# Patient Record
Sex: Female | Born: 1975
Health system: Southern US, Community
[De-identification: ages and names within clinical notes are randomized; demographics above are authoritative.]

## PROBLEM LIST (undated history)

## (undated) DIAGNOSIS — Z1379 Encounter for other screening for genetic and chromosomal anomalies: Secondary | ICD-10-CM

## (undated) DIAGNOSIS — Z803 Family history of malignant neoplasm of breast: Secondary | ICD-10-CM

## (undated) DIAGNOSIS — F419 Anxiety disorder, unspecified: Secondary | ICD-10-CM

## (undated) DIAGNOSIS — D219 Benign neoplasm of connective and other soft tissue, unspecified: Secondary | ICD-10-CM

## (undated) DIAGNOSIS — Z1509 Genetic susceptibility to other malignant neoplasm: Secondary | ICD-10-CM

## (undated) DIAGNOSIS — T7840XA Allergy, unspecified, initial encounter: Secondary | ICD-10-CM

## (undated) DIAGNOSIS — N979 Female infertility, unspecified: Secondary | ICD-10-CM

## (undated) DIAGNOSIS — Z9189 Other specified personal risk factors, not elsewhere classified: Secondary | ICD-10-CM

## (undated) DIAGNOSIS — F410 Panic disorder [episodic paroxysmal anxiety] without agoraphobia: Secondary | ICD-10-CM

## (undated) HISTORY — DX: Family history of malignant neoplasm of breast: Z80.3

## (undated) HISTORY — DX: Other specified personal risk factors, not elsewhere classified: Z91.89

## (undated) HISTORY — DX: Panic disorder (episodic paroxysmal anxiety): F41.0

## (undated) HISTORY — DX: Genetic susceptibility to other malignant neoplasm: Z15.09

## (undated) HISTORY — DX: Benign neoplasm of connective and other soft tissue, unspecified: D21.9

## (undated) HISTORY — DX: Allergy, unspecified, initial encounter: T78.40XA

## (undated) HISTORY — DX: Anxiety disorder, unspecified: F41.9

## (undated) HISTORY — PX: WISDOM TOOTH EXTRACTION: SHX21

## (undated) HISTORY — DX: Female infertility, unspecified: N97.9

## (undated) HISTORY — DX: Encounter for other screening for genetic and chromosomal anomalies: Z13.79

---

## 2006-12-27 ENCOUNTER — Ambulatory Visit: Payer: Self-pay | Admitting: Family Medicine

## 2006-12-27 DIAGNOSIS — J309 Allergic rhinitis, unspecified: Secondary | ICD-10-CM

## 2006-12-27 DIAGNOSIS — J4540 Moderate persistent asthma, uncomplicated: Secondary | ICD-10-CM | POA: Insufficient documentation

## 2007-03-03 ENCOUNTER — Ambulatory Visit (HOSPITAL_COMMUNITY): Admission: RE | Admit: 2007-03-03 | Discharge: 2007-03-03 | Payer: Self-pay | Admitting: Obstetrics and Gynecology

## 2007-07-07 ENCOUNTER — Telehealth: Payer: Self-pay | Admitting: Family Medicine

## 2007-07-11 ENCOUNTER — Ambulatory Visit: Payer: Self-pay | Admitting: Family Medicine

## 2007-08-04 ENCOUNTER — Telehealth: Payer: Self-pay | Admitting: Family Medicine

## 2007-08-18 ENCOUNTER — Ambulatory Visit: Payer: Self-pay | Admitting: Family Medicine

## 2007-10-10 ENCOUNTER — Ambulatory Visit: Payer: Self-pay | Admitting: Family Medicine

## 2007-10-12 LAB — CONVERTED CEMR LAB: TSH: 0.18 microintl units/mL — ABNORMAL LOW (ref 0.35–5.50)

## 2007-12-05 ENCOUNTER — Ambulatory Visit: Payer: Self-pay | Admitting: Family Medicine

## 2007-12-05 DIAGNOSIS — K219 Gastro-esophageal reflux disease without esophagitis: Secondary | ICD-10-CM

## 2008-01-23 ENCOUNTER — Ambulatory Visit: Payer: Self-pay | Admitting: Family Medicine

## 2008-01-23 DIAGNOSIS — F410 Panic disorder [episodic paroxysmal anxiety] without agoraphobia: Secondary | ICD-10-CM

## 2008-02-09 ENCOUNTER — Telehealth: Payer: Self-pay | Admitting: Family Medicine

## 2008-02-09 ENCOUNTER — Ambulatory Visit: Payer: Self-pay | Admitting: Family Medicine

## 2008-08-12 ENCOUNTER — Ambulatory Visit: Payer: Self-pay | Admitting: Family Medicine

## 2008-12-23 ENCOUNTER — Ambulatory Visit: Payer: Self-pay | Admitting: Family Medicine

## 2008-12-23 DIAGNOSIS — H1045 Other chronic allergic conjunctivitis: Secondary | ICD-10-CM

## 2008-12-23 DIAGNOSIS — L2089 Other atopic dermatitis: Secondary | ICD-10-CM

## 2009-01-15 ENCOUNTER — Ambulatory Visit: Payer: Self-pay | Admitting: Family Medicine

## 2009-06-16 ENCOUNTER — Encounter: Payer: Self-pay | Admitting: Family Medicine

## 2009-06-19 ENCOUNTER — Ambulatory Visit: Payer: Self-pay | Admitting: Family Medicine

## 2009-06-27 ENCOUNTER — Telehealth: Payer: Self-pay | Admitting: Family Medicine

## 2009-07-02 ENCOUNTER — Telehealth: Payer: Self-pay | Admitting: Family Medicine

## 2009-07-04 ENCOUNTER — Telehealth: Payer: Self-pay | Admitting: Family Medicine

## 2009-07-04 ENCOUNTER — Ambulatory Visit: Payer: Self-pay | Admitting: Family Medicine

## 2009-08-05 ENCOUNTER — Telehealth: Payer: Self-pay | Admitting: Family Medicine

## 2009-08-06 ENCOUNTER — Telehealth: Payer: Self-pay | Admitting: Family Medicine

## 2009-08-07 ENCOUNTER — Telehealth: Payer: Self-pay | Admitting: Family Medicine

## 2009-08-07 ENCOUNTER — Encounter: Payer: Self-pay | Admitting: Family Medicine

## 2009-10-06 ENCOUNTER — Ambulatory Visit: Payer: Self-pay | Admitting: Internal Medicine

## 2009-10-10 ENCOUNTER — Ambulatory Visit: Payer: Self-pay | Admitting: Family Medicine

## 2009-11-13 ENCOUNTER — Ambulatory Visit: Payer: Self-pay | Admitting: Family Medicine

## 2009-11-13 DIAGNOSIS — R5383 Other fatigue: Secondary | ICD-10-CM

## 2009-11-13 DIAGNOSIS — R5381 Other malaise: Secondary | ICD-10-CM

## 2009-11-13 LAB — CONVERTED CEMR LAB: Vit D, 25-Hydroxy: 24 ng/mL — ABNORMAL LOW (ref 30–89)

## 2009-11-18 ENCOUNTER — Telehealth: Payer: Self-pay | Admitting: Family Medicine

## 2009-11-18 LAB — CONVERTED CEMR LAB
ALT: 11 units/L (ref 0–35)
Albumin: 4.1 g/dL (ref 3.5–5.2)
Basophils Relative: 0.7 % (ref 0.0–3.0)
Bilirubin, Direct: 0.1 mg/dL (ref 0.0–0.3)
CO2: 28 meq/L (ref 19–32)
Chloride: 105 meq/L (ref 96–112)
Eosinophils Relative: 3.7 % (ref 0.0–5.0)
HCT: 40 % (ref 36.0–46.0)
Lymphs Abs: 1.9 10*3/uL (ref 0.7–4.0)
MCHC: 33.3 g/dL (ref 30.0–36.0)
MCV: 90.5 fL (ref 78.0–100.0)
Monocytes Absolute: 0.3 10*3/uL (ref 0.1–1.0)
Neutro Abs: 6.7 10*3/uL (ref 1.4–7.7)
Potassium: 4 meq/L (ref 3.5–5.1)
RBC: 4.42 M/uL (ref 3.87–5.11)
TSH: 0.98 microintl units/mL (ref 0.35–5.50)
Total Protein: 6.8 g/dL (ref 6.0–8.3)
Vitamin B-12: 316 pg/mL (ref 211–911)
WBC: 9.3 10*3/uL (ref 4.5–10.5)

## 2009-11-19 ENCOUNTER — Ambulatory Visit: Payer: Self-pay | Admitting: Family Medicine

## 2009-11-19 LAB — CONVERTED CEMR LAB
Nitrite: NEGATIVE
RBC / HPF: 0
Urobilinogen, UA: 0.2
WBC Urine, dipstick: NEGATIVE

## 2009-11-20 ENCOUNTER — Telehealth: Payer: Self-pay | Admitting: Family Medicine

## 2009-11-20 LAB — CONVERTED CEMR LAB
Calcium: 9 mg/dL (ref 8.4–10.5)
Chloride: 104 meq/L (ref 96–112)
Creatinine, Ser: 0.9 mg/dL (ref 0.4–1.2)

## 2009-12-12 ENCOUNTER — Ambulatory Visit: Payer: Self-pay | Admitting: Family Medicine

## 2009-12-15 LAB — CONVERTED CEMR LAB
Lymphs Abs: 2.1 10*3/uL (ref 0.7–4.0)
MCV: 89.6 fL (ref 78.0–100.0)
Monocytes Relative: 7 % (ref 3–12)
Neutro Abs: 5.2 10*3/uL (ref 1.7–7.7)
Neutrophils Relative %: 62 % (ref 43–77)
RBC: 4.61 M/uL (ref 3.87–5.11)
WBC: 8.3 10*3/uL (ref 4.0–10.5)

## 2009-12-18 ENCOUNTER — Telehealth (INDEPENDENT_AMBULATORY_CARE_PROVIDER_SITE_OTHER): Payer: Self-pay | Admitting: *Deleted

## 2009-12-18 ENCOUNTER — Ambulatory Visit: Payer: Self-pay | Admitting: Family Medicine

## 2009-12-18 DIAGNOSIS — F3342 Major depressive disorder, recurrent, in full remission: Secondary | ICD-10-CM | POA: Insufficient documentation

## 2009-12-19 ENCOUNTER — Telehealth: Payer: Self-pay | Admitting: Family Medicine

## 2009-12-22 ENCOUNTER — Ambulatory Visit: Payer: Self-pay | Admitting: Family Medicine

## 2009-12-22 ENCOUNTER — Telehealth (INDEPENDENT_AMBULATORY_CARE_PROVIDER_SITE_OTHER): Payer: Self-pay | Admitting: *Deleted

## 2010-01-02 ENCOUNTER — Ambulatory Visit: Payer: Self-pay | Admitting: Family Medicine

## 2010-01-21 ENCOUNTER — Telehealth: Payer: Self-pay | Admitting: Family Medicine

## 2010-01-26 ENCOUNTER — Telehealth (INDEPENDENT_AMBULATORY_CARE_PROVIDER_SITE_OTHER): Payer: Self-pay | Admitting: *Deleted

## 2010-02-05 ENCOUNTER — Ambulatory Visit
Admission: RE | Admit: 2010-02-05 | Discharge: 2010-02-05 | Payer: Self-pay | Source: Home / Self Care | Attending: Family Medicine | Admitting: Family Medicine

## 2010-02-10 NOTE — Letter (Signed)
Summary: Endocrinology/[facility]  Endocrinology/[facility]   Imported By: Lester Ruth 06/28/2009 10:21:53  _____________________________________________________________________  External Attachment:    Type:   Image     Comment:   External Document

## 2010-02-10 NOTE — Assessment & Plan Note (Signed)
Summary: MUSCLE SPASMS/CLE   Vital Signs:  Patient profile:   35 year old female Height:      65 inches Weight:      141.0 pounds BMI:     23.55 Temp:     98.7 degrees F oral Pulse rate:   68 / minute Pulse rhythm:   regular BP sitting:   100 / 60  (left arm) Cuff size:   regular  Vitals Entered By: Benny Lennert CMA Duncan Dull) (November 13, 2009 4:16 PM)  History of Present Illness: Chief complaint muscle spasms  35 year old female:  Known well to me presents with a variety of complaints.  Bilateral hand tingling, intermittent buttocks pain, intermittent tingling in her feet. Worse with typing all day. Provocable at the elbow  Posterior neck pain, posterior trapezius pain. Occaisional headaches. Much muscle tightness Some pain in the intraarticular shoulder area as well B.   Likes job, working a lot, sitting in [employer], also working on the computer when getting home.  Trying to get pregnant. No success yet, has been a few years.  Reviewed her L shoulder injury in 09/2009, where a door fell on her, that got better in a couple of weeks afterwards.   ROS: no fevers, chills, sweats, n/v/d. No slurred speech. No balance disturbace. No numb areas. Tingling as described.    Allergies: 1)  ! * Advair 2)  * Singulair  Past History:  Past medical, surgical, family and social histories (including risk factors) reviewed, and no changes noted (except as noted below).  Past Medical History: Reviewed history from 12/23/2008 and no changes required. Allergic rhinitis and conjunctivitis Asthma Gerd Anxiety  Past Surgical History: Reviewed history from 12/27/2006 and no changes required. thyroid nodules, bx benign  Family History: Reviewed history from 12/27/2006 and no changes required. father healthy mother: anxiety brother: anxiety sister: healthy MGF: DM PGF: MI PGM: ? MGM: healthy  Social History: Reviewed history from 10/06/2009 and no changes  required. 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 MS:  See HPI. Neuro:  See HPI; Complains of tingling; denies brief paralysis, difficulty with concentration, disturbances in coordination, falling down, poor balance, seizures, sensation of room spinning, tremors, and visual disturbances. Psych:  denies acute depression right now, slightly anxious, but not decompensated.Marland Kitchen  Physical Exam  General:  Well-developed,well-nourished,in no acute distress; alert,appropriate and cooperative throughout examination Head:  Normocephalic and atraumatic without obvious abnormalities.  Ears:  no external deformities.   Nose:  no external deformity.   Neck:  TTP posteriorly along paracervical musculature. Lungs:  normal respiratory effort.   Msk:  Full ROM at neck no inducible spurlings c5-t1 intact sensation intact motor function 5/5 dtr 2+ UE and LE  full ROM shoulder, elbow + tinels at elbow  Wrist, full ROM, NT bony anatomy + phalen's test after 10 sec B  inducible parasthesias with compression at L brachial plexus, posterior to clavicle  Lumbar, NT throughout HIP EXAM: SIDE: B ROM: Abduction, Flexion, Internal and External range of motion: WNL Pain with terminal IROM and EROM: no GTB: NT SLR: NEG Knees: No effusion FABER: NT REVERSE FABER: NT, neg Piriformis: TTP at direct palpation - neuropathic sensations induced with deep palpation at the piriformis, L > R Str: flexion: 5/5 abduction: 5/5  adduction: 5/5 Strength testing non-tender    Impression & Recommendations:  Problem # 1:  PARESTHESIA (ICD-782.0) I don't think any of the patient's symptoms  right now relate to her prior LEFT shoulder injury last fall. Certainly the patient has a carpal tunnel  syndrome.  Think she's also having some mild ulnar neuropathy,, both of which are irritated by her constant  sitting at a computer at work and at home. She's also having I think some significant piriformis involvement aggravated by above as well.  I was able to manually provoke symptoms  with brachial plexus compression in the LEFT.  She seemed to have some heightened sensation of neuropathic symptoms,, but I don't whether there is any underlying unifying diagnosis.  She does have some anxiety, which probably does contribute some. Reviewed conservative management of all of these and will follow.  We'll check some baseline laboratories to ensure that they're normal  for potential causes of tingling.  Also for potential causes of tiredness and fatigue. Reassured the patient.  Orders: Venipuncture ([address]) T-Vitamin D (25-Hydroxy) [phone]) TLB-BMP (Basic Metabolic Panel-BMET) (80048-METABOL) TLB-CBC Platelet - w/Differential (85025-CBCD) TLB-Hepatic/Liver Function Pnl (80076-HEPATIC) TLB-TSH (Thyroid Stimulating Hormone) (84443-TSH) TLB-B12, Serum-Total ONLY (91478-G95)  Problem # 2:  FATIGUE (ICD-780.79)  Orders: Venipuncture (62130) T-Vitamin D (25-Hydroxy) (86578-46962) TLB-BMP (Basic Metabolic Panel-BMET) (80048-METABOL) TLB-CBC Platelet - w/Differential (85025-CBCD) TLB-Hepatic/Liver Function Pnl (80076-HEPATIC) TLB-TSH (Thyroid Stimulating Hormone) (84443-TSH) TLB-B12, Serum-Total ONLY (95284-X32)  Problem # 3:  CARPAL TUNNEL SYNDROME (ICD-354.0) clear medial nerve entrapment, provocable on exam reviewed ergonomics night splints for now  Problem # 4:  SCIATICA (ICD-724.3) reviewed hip ROM protocol think primarily piriformis compression  Her updated medication list for this problem includes:    Cyclobenzaprine Hcl 10 Mg Tabs (Cyclobenzaprine hcl) .Marland Kitchen... 1 by mouth 2 times daily as needed for back pain  Problem # 5:  NECK PAIN (ICD-723.1)  Her updated medication list for  this problem includes:    Cyclobenzaprine Hcl 10 Mg Tabs (Cyclobenzaprine hcl) .Marland Kitchen... 1 by mouth 2 times daily as needed for back pain  Complete Medication List: 1)  Asmanex 60 Metered Doses 220 Mcg/inh Aepb (Mometasone furoate) .... 2 puffs daily, disp 1 month supply 2)  Albuterol Sulfate (2.5 Mg/67ml) 0.083% Nebu (Albuterol sulfate) .... Nebulized qid as needed 3)  Nexium 40 Mg Cpdr (Esomeprazole magnesium) .... Take one tablet by mouth daily 30 minutes before breakfast 4)  Flonase 50 Mcg/act Susp (Fluticasone propionate) .... 2 squirts into each nostril daily 5)  Cyclobenzaprine Hcl 10 Mg Tabs (Cyclobenzaprine hcl) .Marland Kitchen.. 1 by mouth 2 times daily as needed for back pain  Other Orders: TLB-Cholesterol, Direct LDL (83721-DIRLDL)  Patient Instructions: 1)  CARPAL TUNNEL SPLINTS 2)  ERGONOMICS AT WORK 3)  EAT 3 GOOD MEALS A DAY 4)  START WORKING OUT AGAIN 5)  STRETCH NECK, HIPS, SHOULDERS DURING THE WORK DAY   Orders Added: 1)  Venipuncture [44010] 2)  T-Vitamin D (25-Hydroxy) [27253-66440] 3)  TLB-BMP (Basic Metabolic Panel-BMET) [80048-METABOL] 4)  TLB-CBC Platelet - w/Differential [85025-CBCD] 5)  TLB-Hepatic/Liver Function Pnl [80076-HEPATIC] 6)  TLB-TSH (Thyroid Stimulating Hormone) [84443-TSH] 7)  TLB-B12, Serum-Total ONLY [82607-B12] 8)  TLB-Cholesterol, Direct LDL [83721-DIRLDL] 9)  Est. Patient Level IV [34742]    Current Allergies (reviewed today): ! * ADVAIR * SINGULAIR

## 2010-02-10 NOTE — Assessment & Plan Note (Signed)
Summary: ASTHMA/DLO   Vital Signs:  Patient profile:   35 year old female Height:      65 inches Weight:      142.38 pounds BMI:     23.78 Temp:     98.0 degrees F oral Pulse rate:   80 / minute Pulse rhythm:   regular BP sitting:   120 / 80  (right arm) Cuff size:   regular  Vitals Entered By: Linde Gillis CMA Duncan Dull) (June 19, 2009 3:50 PM) CC: asthma   History of Present Illness: 35 yo here for worsening asthma.  Taking Asmanex and Flonase daily along with albuterol as needed. Has been using her albuterol almost daily for past several weeks. Allergies are worse, seeing an ENT.  Considering sinus surgery.  She is wheezing a little but her biggest complaint is a cough. She has also noticed that she is burping more throughout the day. No fevers, chills, SOB.    Current Medications (verified): 1)  Asmanex 60 Metered Doses 220 Mcg/inh Aepb (Mometasone Furoate) .... 2 Puffs Daily, Disp 1 Month Supply 2)  Fluticasone Propionate 50 Mcg/act  Susp (Fluticasone Propionate) .... 2 Sprays Each Nostril Once Daily 3)  Valtrex 1 Gm Tabs (Valacyclovir Hcl) .... 2 Tabs By Mouth Twice Daily X 1. 4)  Albuterol Sulfate (2.5 Mg/78ml) 0.083%  Nebu (Albuterol Sulfate) .... Nebulized Qid As Needed  Allergies (verified): No Known Drug Allergies  Review of Systems      See HPI General:  Denies fever. ENT:  Complains of nasal congestion. Resp:  Complains of cough and wheezing; denies sputum productive.  Physical Exam  General:  Well-developed,well-nourished,in no acute distress; alert,appropriate and cooperative throughout examination Nose:  stuffy and some rhinnorhea Mouth:  Oral mucosa and oropharynx without lesions or exudates.  Teeth in good repair. Lungs:  Normal respiratory effort, chest expands symmetrically. Lungs are clear to auscultation, no crackles or wheezes.  Heart:  Normal rate and regular rhythm. S1 and S2 normal without gallop, murmur, click, rub or other extra  sounds. Extremities:  No clubbing, cyanosis, edema, or deformity noted with normal full range of motion of all joints.   Psych:  Cognition and judgment appear intact. Alert and cooperative with normal attention span and concentration. No apparent delusions, illusions, hallucinations   Impression & Recommendations:  Problem # 1:  ASTHMA (ICD-493.90) Assessment Deteriorated Time spent with patient 25 minutes, more than 50% of this time was spent counseling patient on her symptoms. I question of GERD is playing a role here as well as many asthmatic suffer from reflux symptoms.  Given samples of Nexium, advised to follow up in 2 weeks.  Lungs clear today, red flags given requiring immediate evaluation, such as worsening resp symptoms.  Her updated medication list for this problem includes:    Asmanex 60 Metered Doses 220 Mcg/inh Aepb (Mometasone furoate) .Marland Kitchen... 2 puffs daily, disp 1 month supply    Albuterol Sulfate (2.5 Mg/53ml) 0.083% Nebu (Albuterol sulfate) ..... Nebulized qid as needed  Complete Medication List: 1)  Asmanex 60 Metered Doses 220 Mcg/inh Aepb (Mometasone furoate) .... 2 puffs daily, disp 1 month supply 2)  Fluticasone Propionate 50 Mcg/act Susp (Fluticasone propionate) .... 2 sprays each nostril once daily 3)  Valtrex 1 Gm Tabs (Valacyclovir hcl) .... 2 tabs by mouth twice daily x 1. 4)  Albuterol Sulfate (2.5 Mg/107ml) 0.083% Nebu (Albuterol sulfate) .... Nebulized qid as needed Prescriptions: VALTREX 1 GM TABS (VALACYCLOVIR HCL) 2 tabs by mouth twice daily x 1.  #  20 x 0   Entered and Authorized by:   Ruthe Mannan MD   Signed by:   Ruthe Mannan MD on 06/19/2009   Method used:   Electronically to        [facility]  [place] (retail)       [address]       [place], [place]  [address]       Ph: [phone] or [phone]       Fax: [phone]   RxID:   [id number]   Current Allergies (reviewed today): No known allergies

## 2010-02-10 NOTE — Progress Notes (Signed)
Summary: thrush  Phone Note Call from Patient Message from:  Patient on November 18, 2009 11:09 AM    Supply Requested: 1 month [facility] [place]  Caller: Patient Call For: Hannah Beat MD Summary of Call: Patient has thrush in her mouth for the oral steroids she is using wants to know if something can be called in [facility] [place] Initial call taken by: Benny Lennert CMA Duncan Dull),  November 18, 2009 11:10 AM  Follow-up for Phone Call        be sure she is garling and swishing with water after using asmanex each time.  Spencer Copland MD  November 18, 2009 12:03 PM   call in nystatin suspension, gargle, swish and swallow, 1 tsp 4 times daily x 1 week, disp 1 month supply Follow-up by: Hannah Beat MD,  November 18, 2009 12:03 PM  Additional Follow-up for Phone Call Additional follow up Details #1::        Patient advised and rx sent to pharmacy.Consuello Masse CMA   Additional Follow-up by: Benny Lennert CMA Duncan Dull),  November 18, 2009 1:21 PM

## 2010-02-10 NOTE — Assessment & Plan Note (Signed)
Summary: uti/alc   Vital Signs:  Patient profile:   35 year old female Weight:      136.50 pounds Temp:     98.1 degrees F oral Pulse rate:   76 / minute Pulse rhythm:   regular BP sitting:   122 / 70  (left arm) Cuff size:   regular  Vitals Entered By: Selena Batten Dance CMA Duncan Dull) (November 19, 2009 10:52 AM) CC: ? UTI Comments LBP and lower abd pain (Different than menstrual cramps)   History of Present Illness: CC: ? UTI  Presents with husband.  1d h/o cramping lower abdomen and lower back pain.  maybe mild dysuria.  + mild polyuria.  No urgency.  No h/o UTIs in past.  + decreased appetite.  No fevers/chills, nausea/vomiting, no diarrhea/constipation, no blood in stool or urine.  ? small hemorrhoid.  No NS.  +fatigue.  Started prenatal multivitamin last week.  Has been unable to get pregnant for 10 years.  GYN Dr. Patsy Lager.  told due to low progesterone.  Poor eating, has lost 9lbs in last few weeks.  Ate bagel and bit of burger yesterday.  nothing today.  2d ago had sweet tea from [employer] and fruit parfait, healthy choice for lunch, no dinner.  Drinking sodas.  Tends to fluctuate 20 lbs/year.  Denies any EtOH ingestion.  denies vomiting.  Currently on period, usually regular.  No vag d/c, itching.  LMP 10/06/2009.  24-35d cycles.  Given med for thrush yesterday.  recent blood work reviewed.  CMP WNL, CBC WNL x plt low at 107.  vit D 24.  Told not to worry about plt, went on web and read up on things, cried about it, worried about leukemia..  Current Medications (verified): 1)  Asmanex 60 Metered Doses 220 Mcg/inh Aepb (Mometasone Furoate) .... 2 Puffs Daily, Disp 1 Month Supply 2)  Albuterol Sulfate (2.5 Mg/87ml) 0.083%  Nebu (Albuterol Sulfate) .... Nebulized Qid As Needed 3)  Nexium 40 Mg Cpdr (Esomeprazole Magnesium) .... Take One Tablet By Mouth Daily 30 Minutes Before Breakfast 4)  Flonase 50 Mcg/act Susp (Fluticasone Propionate) .... 2 Squirts Into Each Nostril Daily 5)   Cyclobenzaprine Hcl 10 Mg  Tabs (Cyclobenzaprine Hcl) .Marland Kitchen.. 1 By Mouth 2 Times Daily As Needed For Back Pain  Allergies: 1)  ! 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  nontoxic. Head:  Normocephalic and atraumatic without obvious abnormalities. No apparent alopecia or balding. no frontal sinus tenderness, minimal max sinus tenderness. Eyes:  irritated, PERRLA, EOMI Mouth:  + white flim over tongue.  lips dry.  no pharyngeal erythema. Neck:  No deformities, masses, or tenderness noted. Lungs:  Normal respiratory effort, chest expands symmetrically. Lungs are clear to auscultation, no crackles or wheezes.  Heart:  Normal rate and regular rhythm. S1 and S2 normal without gallop, murmur, click, rub or other extra sounds. Abdomen:  Bowel sounds positive,abdomen soft and without masses, organomegaly or hernias noted.  prominent pulses abd aorta.  mild tenderness to deep  palpation BLQ, no rebound, no guarding, no HSM, negative murphy sign. Pulses:  2+ rad pulses Extremities:  no pedal edema Skin:  Intact without suspicious lesions or rashes   Impression & Recommendations:  Problem # 1:  ABDOMINAL PAIN, UNSPECIFIED SITE (ICD-789.00) with large ketones and small protein in urine, concentrated.  likely dehydrated.  recent BMP without anion gap, normal glu.  Given new finding of ketonuria, rechekc BMP to eval  HCO3, glu, anion gap, check serum ketone bodies.  Pt denies EtOH or other ingestion, leaving fasting state as possible cause of ketonuria, could also be causing abd pain?  Encouraged pt to eat 3 square meals a day even if not hungry, start increasing fluid intake (more water, less sodas).  RTC 1 wk for f/u.  Sooner if worsening.  No evidence of infection on UA today.  nontoxic today, VSS.  significant anxiety.  reassured, leukemia not likely as no other abnormality in CBC. advised to take nystatin for thrush and await rpt plt in 1 mo.  Orders: Venipuncture (70623) Specimen Handling (76283) TLB-BMP (Basic Metabolic Panel-BMET) (80048-METABOL) T- * Misc. Laboratory test [phone])  Problem # 2:  KETONURIA (ICD-791.6) see above.  likely due to fasting state/starvation.  rec increase food intake.  (poor intake over last 3 days.)  Complete Medication List: 1)  Asmanex 60 Metered Doses 220 Mcg/inh Aepb (Mometasone furoate) .... 2 puffs daily, disp 1 month supply 2)  Albuterol Sulfate (2.5 Mg/13ml) 0.083% Nebu (Albuterol sulfate) .... Nebulized qid as needed 3)  Nexium 40 Mg Cpdr (Esomeprazole magnesium) .... Take one tablet by mouth daily 30 minutes before breakfast 4)  Flonase 50 Mcg/act Susp (Fluticasone propionate) .... 2 squirts into each nostril daily 5)  Cyclobenzaprine Hcl 10 Mg Tabs (Cyclobenzaprine hcl) .Marland Kitchen.. 1 by mouth 2 times daily as needed for back pain  Other Orders: Flu Vaccine 23yrs + ([address]) Admin 1st Vaccine (71062)  Patient Instructions: 1)  Push more water.  Get 3 meals a day even if you don't feel hungry. 2)  i'd like you to return next week for recheck with Copland or myself. 3)  Return sooner if not improving. 4)  Good to see you today, call clinic with questions. 5)  Blood work today - BMP, ketones (acetone, beta hydroxybutyrate, acetoacetic acid) re: 789.00   Orders Added: 1)  Venipuncture [36415] 2)  Specimen Handling [99000] 3)  TLB-BMP (Basic Metabolic Panel-BMET)  [80048-METABOL] 4)  T- * Misc. Laboratory test [99999] 5)  Flu Vaccine 50yrs + [90658] 6)  Admin 1st Vaccine [90471] 7)  Est. Patient Level IV [69485]   Immunizations Administered:  Influenza Vaccine # 1:    Vaccine Type: Fluvax 3+    Site: right deltoid    Mfr: GlaxoSmithKline    Dose: 0.5 ml    Route: IM    Given by: Janee Morn CMA (AAMA)    Exp. Date: 07/11/2010    Lot #: [id number]    VIS given: 08/05/09 version given November 19, 2009.  Flu Vaccine Consent Questions:    Do you have a history of severe allergic reactions to this vaccine? no    Any prior history of allergic reactions to egg and/or gelatin? no    Do you have a sensitivity to the preservative Thimersol? no    Do you have a past history of Guillan-Barre Syndrome? no    Do you currently have an acute febrile illness? no    Have you ever had a severe reaction to latex? no  Vaccine information given and explained to patient? yes    Are you currently pregnant? no   Immunizations Administered:  Influenza Vaccine # 1:    Vaccine Type: Fluvax 3+    Site: right deltoid    Mfr: GlaxoSmithKline    Dose: 0.5 ml    Route: IM    Given by: Janee Morn CMA (AAMA)    Exp. Date: 07/11/2010    Lot #: [id number]    VIS given: 08/05/09 version given November 19, 2009.  Current Allergies (reviewed today): ! * ADVAIR [facility]  Laboratory Results   Urine Tests  Date/Time Received: November 19, 2009  Date/Time Reported: November 19, 2009   Routine Urinalysis   Color: amber Appearance: Clear Glucose: negative   (Normal Range: Negative) Bilirubin: negative   (Normal Range: Negative) Ketone: >= (160)   (Normal Range: Negative) Spec. Gravity: >=1.030   (Normal Range: 1.003-1.035) Blood: negative   (Normal Range: Negative) pH: 6.0   (Normal Range: 5.0-8.0) Protein: 30   (Normal Range: Negative) Urobilinogen: 0.2   (Normal Range: 0-1) Nitrite: negative   (Normal Range: Negative) Leukocyte Esterace: negative    (Normal Range: Negative)  Urine Microscopic WBC/HPF: rare RBC/HPF: 0 Bacteria/HPF: tr Mucous/HPF: large Epithelial/HPF: 1-5 Casts/LPF: no    Comments: read by ................Eustaquio Boyden  MD  November 19, 2009 11:41 AM

## 2010-02-10 NOTE — Assessment & Plan Note (Signed)
Summary: NECK PAIN WANTS SECOND OPINION/DLO   Vital Signs:  Patient profile:   35 year old female Height:      65 inches Weight:      140 pounds BMI:     23.38 Temp:     98.4 degrees F oral Pulse rate:   68 / minute Pulse rhythm:   regular BP sitting:   102 / 60  (right arm) Cuff size:   regular  Vitals Entered By: Linde Gillis CMA Duncan Dull) (October 10, 2009 12:21 PM) CC: neck pain, wants second opinion   History of Present Illness: 35 yo here for second opinion. Saw Dr. Renee Ramus last week for scalp pain/neck pain, starts occipital region midline, travels up into scalp. Sometimes has tingling down left arm.  Feels it no better, in fact worse.  Was advised that it was likely a muscle spasm.  Tried rest and walking and its not helping. No h/o trauma.   No fevers. No left arm weakness.   Current Medications (verified): 1)  Asmanex 60 Metered Doses 220 Mcg/inh Aepb (Mometasone Furoate) .... 2 Puffs Daily, Disp 1 Month Supply 2)  Albuterol Sulfate (2.5 Mg/32ml) 0.083%  Nebu (Albuterol Sulfate) .... Nebulized Qid As Needed 3)  Nexium 40 Mg Cpdr (Esomeprazole Magnesium) .... Take One Tablet By Mouth Daily 30 Minutes Before Breakfast 4)  Flonase 50 Mcg/act Susp (Fluticasone Propionate) .... 2 Squirts Into Each Nostril Daily 5)  Cyclobenzaprine Hcl 10 Mg  Tabs (Cyclobenzaprine Hcl) .Marland Kitchen.. 1 By Mouth 2 Times Daily As Needed For Back Pain  Allergies: 1)  ! * Advair 2)  * Singulair  Past History:  Past Medical History: Last updated: 12/23/2008 Allergic rhinitis and conjunctivitis Asthma Gerd Anxiety  Past Surgical History: Last updated: 12/27/2006 thyroid nodules, bx benign  Family History: Last updated: 12/27/2006 father healthy mother: anxiety brother: anxiety sister: healthy MGF: DM PGF: MI PGM: ? MGM: healthy  Social History: Last updated: 10/06/2009 Occupation: Works Paramedic at [employer] Married Never Smoked Alcohol use-no Drug  use-no Regular exercise-yes, treadmill 2-3 x per week Diet: fruits and veggies, water  Risk Factors: Exercise: yes (12/27/2006)  Risk Factors: Smoking Status: never (12/27/2006)  Review of Systems      See HPI General:  Denies fever. MS:  Denies joint pain, joint redness, joint swelling, and loss of strength.  Physical Exam  General:  Well-developed,well-nourished,in no acute distress; alert,appropriate and cooperative throughout examination Msk:  midline occipital "tightness" but not tenderness.  FROM at neck, + tight L trapezius Neurologic:  No cranial nerve deficits noted. Station and gait are normal. Plantar reflexes are down-going bilaterally. DTRs are symmetrical throughout. Sensory, motor and coordinative functions appear intact.   Impression & Recommendations:  Problem # 1:  NECK PAIN (ICD-723.1) Assessment Deteriorated Likely muscle spasm, agree with prior assessment. Will try trial of Flexeril, continue other conservative measures she is already doing. Pt agreed with plan. Her updated medication list for this problem includes:    Cyclobenzaprine Hcl 10 Mg Tabs (Cyclobenzaprine hcl) .Marland Kitchen... 1 by mouth 2 times daily as needed for back pain  Complete Medication List: 1)  Asmanex 60 Metered Doses 220 Mcg/inh Aepb (Mometasone furoate) .... 2 puffs daily, disp 1 month supply 2)  Albuterol Sulfate (2.5 Mg/43ml) 0.083% Nebu (Albuterol sulfate) .... Nebulized qid as needed 3)  Nexium 40 Mg Cpdr (Esomeprazole magnesium) .... Take one tablet by mouth daily 30 minutes before breakfast 4)  Flonase 50 Mcg/act Susp (Fluticasone propionate) .... 2 squirts into each nostril daily  5)  Cyclobenzaprine Hcl 10 Mg Tabs (Cyclobenzaprine hcl) .Marland Kitchen.. 1 by mouth 2 times daily as needed for back pain Prescriptions: CYCLOBENZAPRINE HCL 10 MG  TABS (CYCLOBENZAPRINE HCL) 1 by mouth 2 times daily as needed for back pain  #30 x 0   Entered and Authorized by:   Ruthe Mannan MD   Signed by:   Ruthe Mannan MD  on 10/10/2009   Method used:   [facility]        [facility]  [place] [facility]retail)       [address]       [place], [place]  [address]       Ph: [phone] or [phone]       Fax: [phone]   RxID:   [id number]   Current Allergies (reviewed today): ! * ADVAIR * SINGULAIR

## 2010-02-10 NOTE — Progress Notes (Signed)
Summary: not much better  Phone Note Call from Patient Call back at Home Phone [phone]   Caller: Patient Call For: Dr. Dayton Martes Summary of Call: Pt has been treated for asthma/ sinus infection with z-pack.  She is only a little better.  She is asking if she should increase her dose on her steroid.  [facility] [place]  Does she need to be seen? Initial call taken by: Lowella Petties CMA,  July 02, 2009 3:00 PM  Follow-up for Phone Call        Needs to be seen. Ruthe Mannan MD  July 02, 2009 3:11 PM   Additional Follow-up for Phone Call Additional follow up Details #1::        Pt will call back on 6/24 for appt. Additional Follow-up by: Lowella Petties CMA,  July 02, 2009 3:43 PM

## 2010-02-10 NOTE — Assessment & Plan Note (Signed)
Summary: PER DR Liala Codispoti/CLE   Vital Signs:  Patient profile:   35 year old female Height:      65 inches Weight:      145.2 pounds BMI:     24.25 O2 Sat:      100 % on 84 Temp:     97.8 degrees F oral Pulse rate:   84 / minute Pulse rhythm:   regular Resp:     24 per minute BP sitting:   120 / 70  (left arm) Cuff size:   regular  Vitals Entered By: Benny Lennert CMA Duncan Dull) (July 04, 2009 4:03 PM)  O2 Flow:  84  History of Present Illness: Chief complaint recheck asthma  pleasant patient that I know well who presents in followup with recurrent cough. She has been coughing quite extensively with minimal sputum production the last 2 or 3 weeks. She has been taking her Asmanex routinely, and she is currently now using her albuterol quite frequently every 4-6 hours.  She is not actively wheezing. She did also recently started trial of Nexium, see if this improved her symptoms as well, but she is only been on this for about one week.  She also additionally has completed a course of azithromycin.  Currently, no fever, chills, sweats. Coughing as described. No indigestion.  GEN: WDWN, NAD, Non-toxic, A & O x 3 HEENT: Atraumatic, Normocephalic. Neck supple. No masses, No LAD. Ears and Nose: No external deformity. CV: RRR, No M/G/R. No JVD. No thrill. No extra heart sounds. PULM: CTA B, no wheezes, crackles, rhonchi. No retractions. No resp. distress. No accessory muscle use. EXTR: No c/c/e NEURO: Normal gait.  PSYCH: Normally interactive. Conversant. Not depressed or anxious appearing.  Calm demeanor.    Allergies (verified): No Known Drug Allergies  Past History:  Past medical, surgical, family and social histories (including risk factors) reviewed, and no changes noted (except as noted below).  Past Medical History: Reviewed history from 12/23/2008 and no changes required. Allergic rhinitis and conjunctivitis Asthma Gerd Anxiety  Past Surgical History: Reviewed  history from 12/27/2006 and no changes required. thyroid nodules, bx benign  Family History: Reviewed history from 12/27/2006 and no changes required. father healthy mother: anxiety brother: anxiety sister: healthy MGF: DM PGF: MI PGM: ? MGM: healthy  Social History: Reviewed history from 08/12/2008 and no changes required. 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 She has done better with Asmanex. She also has significant allergies, and I am going to do a trial of some Singulair, to see if this calms her down as well.  Her updated medication list for this problem includes:    Asmanex 60 Metered Doses 220 Mcg/inh Aepb (Mometasone furoate) .Marland Kitchen... 2 puffs daily, disp 1 month supply    Albuterol Sulfate (2.5 Mg/85ml) 0.083% Nebu (Albuterol sulfate) ..... Nebulized qid as needed    Singulair 10 Mg Tabs (Montelukast sodium) .Marland Kitchen... 1 by mouth daily  Problem # 2:  GERD (ICD-530.81) Assessment: Unchanged for one-week,  I do not think this is an adequate trial to see if her GERD symptoms are controlled. Also this very likely could be influencing her cough, and we discussed at he would certainly be reasonable to use  Prilosec or Prevacid over-the-counter which is much cheaper option  Complete Medication List: 1)  Asmanex 60 Metered Doses 220 Mcg/inh Aepb (Mometasone furoate) .... 2 puffs daily, disp 1 month supply 2)  Albuterol Sulfate (2.5 Mg/84ml) 0.083% Nebu (Albuterol sulfate) .... Nebulized qid as needed 3)  Singulair 10 Mg Tabs (Montelukast sodium) .Marland Kitchen.. 1 by mouth daily Prescriptions: SINGULAIR 10 MG TABS (MONTELUKAST SODIUM) 1 by mouth daily  #30 x  11   Entered and Authorized by:   Hannah Beat MD   Signed by:   Hannah Beat MD on 07/04/2009   Method used:   Electronically to        [facility]  [place] (retail)       [address]       [place], [place]  [address]       Ph: [phone] or [phone]       Fax: [phone]   RxID:   [id number]   Current Allergies (reviewed today): No known allergies

## 2010-02-10 NOTE — Assessment & Plan Note (Signed)
Summary: DISCUSS ANXIETY   Vital Signs:  Patient profile:   35 year old female Height:      65 inches Weight:      137.12 pounds BMI:     22.90 Temp:     98.5 degrees F oral Pulse rate:   76 / minute Pulse rhythm:   regular BP sitting:   110 / 70  (left arm) Cuff size:   regular  Vitals Entered By: Benny Lennert CMA Duncan Dull) (December 18, 2009 2:56 PM)  History of Present Illness: Chief complaint anxiety  Current Problems (verified): 1)  Depressive Disorder  (ICD-311) 2)  Candidiasis, Esophageal  (ICD-112.84) 3)  Sciatica  (ICD-724.3) 4)  Carpal Tunnel Syndrome  (ICD-354.0) 5)  Fatigue  (ICD-780.79) 6)  Screening, Diabetes Mellitus  (ICD-V77.1) 7)  Screening For Lipoid Disorders  (ICD-V77.91) 8)  Dermatitis, Atopic  (ICD-691.8) 9)  Allergic Conjunctivitis  (ICD-372.14) 10)  Anxiety  (ICD-300.00) 11)  Gerd  (ICD-530.81) 12)  Anxiety State, Unspecified  (ICD-300.00) 13)  Asthma  (ICD-493.90) 14)  Allergic Rhinitis  (ICD-477.9)  Allergies: 1)  ! * Advair 2)  * Singulair   Impression & Recommendations:  Problem # 1:  DEPRESSIVE DISORDER (ICD-311) Assessment New >25 minutes spent in face to face time with patient, >50% spent in counselling or coordination of care: depressed for months. Much stress at work. She's been doing a much better job with physical activity, she is now doing given and other physical activity. She feels much better from a musculoskeletal standpoint. She's been highly anxious and worrying about everything. Well known to me. Coworkers and husband have noticed this and urged her to come to the office for evaluation. She tolerated Lexapro in the past, given cost, we'll start Celexa, and for the next 2-3 weeks, she can take some Klonopin as needed  The following medications were removed from the medication list:    Diazepam 2 Mg Tabs (Diazepam) .Marland Kitchen... 1 by mouth two times a day as needed anxiety attacks Her updated medication list for this problem  includes:    Citalopram Hydrobromide 20 Mg Tabs (Citalopram hydrobromide) .Marland Kitchen... 1 by mouth daily    Clonazepam 0.5 Mg Tabs (Clonazepam) .Marland Kitchen... 1 by mouth two times a day as needed anxiety  Problem # 2:  ANXIETY STATE, UNSPECIFIED (ICD-300.00) Assessment: Deteriorated  The following medications were removed from the medication list:    Diazepam 2 Mg Tabs (Diazepam) .Marland Kitchen... 1 by mouth two times a day as needed anxiety attacks Her updated medication list for this problem includes:    Citalopram Hydrobromide 20 Mg Tabs (Citalopram hydrobromide) .Marland Kitchen... 1 by mouth daily    Clonazepam 0.5 Mg Tabs (Clonazepam) .Marland Kitchen... 1 by mouth two times a day as needed anxiety  Problem # 3:  CANDIDIASIS, ESOPHAGEAL (ICD-112.84) poor job gargling and cleaning out her mouth and throat after her Asmanex. Discussed again, and I think she has developed a candidal esophagitis, we'll treat this empirically  Complete Medication List: 1)  Asmanex 60 Metered Doses 220 Mcg/inh Aepb (Mometasone furoate) .... 2 puffs daily, disp 1 month supply 2)  Albuterol Sulfate (2.5 Mg/61ml) 0.083% Nebu (Albuterol sulfate) .... Nebulized qid as needed 3)  Nexium 40 Mg Cpdr (Esomeprazole magnesium) .... Take one tablet by mouth daily 30 minutes before breakfast 4)  Nystatin 100000 Unit/ml Susp (Nystatin) .... Swish 4 times daily 5)  Citalopram Hydrobromide 20 Mg Tabs (Citalopram hydrobromide) .Marland Kitchen.. 1 by mouth daily 6)  Clonazepam 0.5 Mg Tabs (Clonazepam) .Marland Kitchen.. 1 by mouth  two times a day as needed anxiety 7)  Fluconazole 150 Mg Tabs (Fluconazole) .Marland Kitchen.. 1 by mouth daily x 3 weeks  Patient Instructions: 1)  recheck 1 month with me Prescriptions: FLUCONAZOLE 150 MG TABS (FLUCONAZOLE) 1 by mouth daily x 3 weeks  #21 x 0   Entered and Authorized by:   Hannah Beat MD   Signed by:   Hannah Beat MD on 12/18/2009   Method used:   [facility]        [facility]  [place] (retail)       [address]       [place], [place]   [address]       Ph: [phone] or [phone]       Fax: [phone]   RxID:   [id number] CLONAZEPAM 0.5 MG TABS (CLONAZEPAM) 1 by mouth two times a day as needed anxiety  #20 x 0   Entered and Authorized by:   Hannah Beat MD   Signed by:   Hannah Beat MD on 12/18/2009   Method used:   Print then Give to Patient   RxID:   [id number] CITALOPRAM HYDROBROMIDE 20 MG TABS (CITALOPRAM HYDROBROMIDE) 1 by mouth daily  #30 x 2   Entered and Authorized by:   Hannah Beat MD   Signed by:   Hannah Beat MD on 12/18/2009   Method used:   Print then Give to Patient   RxID:   [id number]    Orders Added: 1)  Est. Patient Level IV [[address]]    Current Allergies (reviewed today): ! * ADVAIR * SINGULAIR

## 2010-02-10 NOTE — Progress Notes (Signed)
Summary: nexium approval still pending  Phone Note From Pharmacy   [facility]  [place] Summary of Call: Received approval letter from [facility] regarding nexium, but it has not been approved after all.  They have sent another prior auth form to be completed.  Form is on your desk. Initial call taken by: Lowella Petties CMA,  August 07, 2009 10:38 AM     Appended Document: nexium approval still pending Prior auth approval received.  Letter sent for doctor signature and scanning.

## 2010-02-10 NOTE — Assessment & Plan Note (Signed)
Summary: ASTHMA/DS   Vital Signs:  Patient profile:   35 year old female Height:      65 inches Weight:      130 pounds BMI:     21.71 O2 Sat:      99 % on Room air Temp:     97.9 degrees F oral Pulse rate:   80 / minute Pulse rhythm:   regular Resp:     16 per minute BP sitting:   102 / 64  (left arm) Cuff size:   regular  Vitals Entered By: Delilah Shan CMA Duncan Dull) (January 15, 2009 12:40 PM)  O2 Flow:  Room air CC: asthma   History of Present Illness: Seen on 12/13 and treated for sinusitis with Avelox. Felt much better until approx 1 week ago, started getting chest tightness and "ribs catching" when she took deep breaths or coughed. Cough increased somewhat. No sputum production. No feel feverish with chills at night.   No wheezes, no shortness of breath but she states often times with her asthma, she only gets this "tightness" and rib discomfort.    Current Medications (verified): 1)  Asmanex 60 Metered Doses 220 Mcg/inh Aepb (Mometasone Furoate) .... 2 Puffs Daily, Disp 1 Month Supply 2)  Fluticasone Propionate 50 Mcg/act  Susp (Fluticasone Propionate) .... 2 Sprays Each Nostril Once Daily 3)  Triamcinolone Acetonide 0.1 % Crea (Triamcinolone Acetonide) .... Apply Bid To Affected Area 4)  Azithromycin 250 Mg  Tabs (Azithromycin) .... 2 By  Mouth Today and Then 1 Daily For 4 Days  Allergies (verified): No Known Drug Allergies  Review of Systems      See HPI General:  Complains of chills and fever. ENT:  Denies nasal congestion, sinus pressure, and sore throat. Resp:  Complains of cough; denies shortness of breath, sputum productive, and wheezing.  Physical Exam  General:  Well-developed,well-nourished,in no acute distress; alert,appropriate and cooperative throughout examination Ears:  External ear exam shows no significant lesions or deformities.  Otoscopic examination reveals clear canals, tympanic membranes are intact bilaterally without bulging,  retraction, inflammation or discharge. Hearing is grossly normal bilaterally. Mouth:  Oral mucosa and oropharynx without lesions or exudates.  Teeth in good repair. Lungs:  Normal respiratory effort, chest expands symmetrically. Lungs are clear to auscultation, no crackles or wheezes. Does have discomfort with deep breaths. No tenderness to palpation over ribs or chest wall. Heart:  Normal rate and regular rhythm. S1 and S2 normal without gallop, murmur, click, rub or other extra sounds. Psych:  Cognition and judgment appear intact. Alert and cooperative with normal attention span and concentration. No apparent delusions, illusions, hallucinations   Impression & Recommendations:  Problem # 1:  ASTHMA (ICD-493.90) Assessment Deteriorated Either msk strain (unlikely rib fracture as pain bilateral) vs bronchtis.  Given that she just finished avelox with reported fevers, will try zpack to cover atypicals.  Advised to f/u in 5 days if symptoms persist. Her updated medication list for this problem includes:    Asmanex 60 Metered Doses 220 Mcg/inh Aepb (Mometasone furoate) .Marland Kitchen... 2 puffs daily, disp 1 month supply  Complete Medication List: 1)  Asmanex 60 Metered Doses 220 Mcg/inh Aepb (Mometasone furoate) .... 2 puffs daily, disp 1 month supply 2)  Fluticasone Propionate 50 Mcg/act Susp (Fluticasone propionate) .... 2 sprays each nostril once daily 3)  Triamcinolone Acetonide 0.1 % Crea (Triamcinolone acetonide) .... Apply bid to affected area 4)  Azithromycin 250 Mg Tabs (Azithromycin) .... 2 by  mouth today and then  1 daily for 4 days Prescriptions: AZITHROMYCIN 250 MG  TABS (AZITHROMYCIN) 2 by  mouth today and then 1 daily for 4 days  #6 x 0   Entered and Authorized by:   Ruthe Mannan MD   Signed by:   Ruthe Mannan MD on 01/15/2009   Method used:   Electronically to        [facility]  [place] (retail)       [address]       [place], [place]  [address]       Ph: [phone] or  [phone]       Fax: [phone]   RxID:   [id number]   Current Allergies (reviewed today): No known allergies

## 2010-02-10 NOTE — Progress Notes (Signed)
Summary: refill request for nystatin  Phone Note Refill Request Call back at Home Phone [phone] Message from:  Patient  Refills Requested: Medication #1:  nystatin Pt states the thrush she had in her throat a few weeks ago has come back. This is aggrivating her asthma.   She is asking for a refill to be called [facility].  Initial call taken by: Lowella Petties CMA, AAMA,  December 18, 2009 8:51 AM  Follow-up for Phone Call        ok to refill, swish and swallow 4 times daily   Also, schedule a follow-up with me - mostly to discuss anxiety. I know we had to cancel that appointment. Follow-up by: Hannah Beat MD,  December 18, 2009 10:02 AM  Additional Follow-up for Phone Call Additional follow up Details #1::        RS CALLED TO PHARMACY AND APPT MADE FOR TODAY AT PATIENT REQUEST.Consuello Masse CMA   Additional Follow-up by: Benny Lennert CMA Duncan Dull),  December 18, 2009 10:09 AM    New/Updated Medications: NYSTATIN 100000 UNIT/ML SUSP (NYSTATIN) swish 4 times daily Prescriptions: NYSTATIN 100000 UNIT/ML SUSP (NYSTATIN) swish 4 times daily  #240cc x 0   Entered by:   Benny Lennert CMA (AAMA)   Authorized by:   Hannah Beat MD   Signed by:   Benny Lennert CMA (AAMA) on 12/18/2009   Method used:   [facility]ronically [facility]        [facility]  [place] (retail)       [address]       [place], [place]  [address]       Ph: [phone] or [phone]       Fax: [phone]   RxID:   [id number]

## 2010-02-10 NOTE — Letter (Signed)
Summary: Out of Work  [facility]  [address] [place], [place] [address]   Phone: [phone]  Fax: [phone]    October 10, 2009   Employee:  SHELLA LAHMAN    To Whom It May Concern:   For Medical reasons, please excuse the above named employee from work for the following dates:  Start:   10/10/2009  End:   10/11/2009  If you need additional information, please feel free to contact our office.         Sincerely,    Ruthe Mannan MD

## 2010-02-10 NOTE — Progress Notes (Signed)
Summary: anxiety  Phone Note Call from Patient Call back at Home Phone [phone]   Caller: Patient Call For: Hannah Beat MD Summary of Call: Patient calling says her anxiety has been really bad for the past week and she is not sure why but, wants to know if she can have something called in to last her a few weeks. Patient says that she feels like she is out of control and feels like she cant get things under control. Patient says she saw doctor G yesterday but, for possible uti. Please advise  [facility] Initial call taken by: Benny Lennert CMA Duncan Dull),  November 20, 2009 3:13 PM  Follow-up for Phone Call        called and discussed with the patient. weight loss, not feeling like herself. lost 9 pounds, cries on the phone, feels stable at home.   check home UPT - as long as negative OK to use emergency valium.  was worried about platelets being slightly low -- feels like her response to everything is exagerated.  f/u with me next week  Hannah Beat MD  November 20, 2009 6:00 PM     New/Updated Medications: DIAZEPAM 2 MG TABS (DIAZEPAM) 1 by mouth two times a day as needed anxiety attacks Prescriptions: DIAZEPAM 2 MG TABS (DIAZEPAM) 1 by mouth two times a day as needed anxiety attacks  #20 x 0   Entered and Authorized by:   Hannah Beat MD   Signed by:   Hannah Beat MD on 11/20/2009   Method used:   Telephoned to ...       [facility]  [place] (retail)       [address]       [place], [place]  [address]       Ph: [phone] or [phone]       Fax: [phone]   RxID:   [id number]

## 2010-02-10 NOTE — Progress Notes (Signed)
Summary: nexium  Phone Note Call from Patient Call back at Home Phone [phone]   Caller: Patient Call For: Gloria Beat MD Summary of Call: Patient was seen by dr. Dayton Martes  on 06-19-09 and was given samples of nexium. She says that it has worked very well for her and is asking if she can get a rx for the nexium [facility] [place].  Initial call taken by: Melody Comas,  August 05, 2009 3:34 PM  Follow-up for Phone Call        Nexium 40 mg, 1 by mouth daily 30 minutes before breakfast. #30, 11 refills failure prilosec, prevacid Follow-up by: Gloria Beat MD,  August 05, 2009 4:12 PM  Additional Follow-up for Phone Call Additional follow up Details #1::        Rx Called In, left message on cell phone voicemail advising patient as instructed. Additional Follow-up by: Linde Gillis CMA Duncan Dull),  August 05, 2009 4:48 PM    New/Updated Medications: NEXIUM 40 MG CPDR (ESOMEPRAZOLE MAGNESIUM) take one tablet by mouth daily 30 minutes before breakfast Prescriptions: NEXIUM 40 MG CPDR (ESOMEPRAZOLE MAGNESIUM) take one tablet by mouth daily 30 minutes before breakfast  #30 x 11   Entered by:   Linde Gillis CMA (AAMA)   Authorized by:   Gloria Beat MD   Signed by:   Linde Gillis CMA (AAMA) on 08/05/2009   Method used:   Electronically to        [facility]  [place] (retail)       [address]       [place], [place]  [address]       Ph: [phone] or [phone]       Fax: [phone]   RxID:   [id number]   Current Allergies (reviewed today): No known allergies

## 2010-02-10 NOTE — Assessment & Plan Note (Signed)
Summary: PROBLEMS WITH NECK AND PAIN/JRR   Vital Signs:  Patient profile:   35 year old female Weight:      145.25 pounds Temp:     97.2 degrees F oral Pulse rate:   76 / minute Pulse rhythm:   regular BP sitting:   108 / 70  (right arm) Cuff size:   regular  Vitals Entered By: Selena Batten Dance CMA Duncan Dull) (October 06, 2009 4:11 PM) CC: Neck pain/head tingling/sinus issues   History of Present Illness: CC: sinus?,neck/scalp pain  2wk h/o scalp pain/neck pain, starts occipital region midline, travels up into scalp.  Feels may be coming from wearing hair tight.  No vision changes, no recent trauma/injury, no radiculopathy or paresthesias down arms.  No recent weight loss.  Sinus issues: all life, recently worse.  recently ran out of asmanex, to refill after appt today.  Low grade fever, no coughing.  + ear tenderness/fullness.  States had CT scan prior for sinus issues, told could have surgery or treat medically, decided to treat medically.  Increased stress last 2 weeks.  Husband - changed to at home work, infertility issues, stress at home.  Does suffer from anxiety.  previously on lexapro but doesn't like to take med long term.  Singulair caused anxiety.  Advair caused anxiety.  Thinks may have tried nasonex, didn't do well.  Tried cetirizine but made her sleepy during day.  Current Medications (verified): 1)  Asmanex 60 Metered Doses 220 Mcg/inh Aepb (Mometasone Furoate) .... 2 Puffs Daily, Disp 1 Month Supply 2)  Albuterol Sulfate (2.5 Mg/9ml) 0.083%  Nebu (Albuterol Sulfate) .... Nebulized Qid As Needed 3)  Nexium 40 Mg Cpdr (Esomeprazole Magnesium) .... 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 No apparent alopecia or balding. no frontal sinus tenderness, minimal max sinus tenderness. Eyes:  irritated, PERRLA, EOMI Ears:  External ear exam shows no significant lesions or deformities.  Otoscopic examination reveals clear canals, tympanic membranes are intact bilaterally without bulging, retraction, inflammation or discharge. Hearing is grossly normal bilaterally. Nose:  pale, boggy turbinates. Mouth:  Oral mucosa and oropharynx without lesions or exudates.  Teeth in good repair. Neck:  No deformities, masses, or tenderness noted. Lungs:  Normal respiratory effort, chest expands symmetrically. Lungs are clear to auscultation, no crackles or wheezes.  Heart:  Normal rate and regular rhythm. S1 and S2 normal without gallop, murmur, click, rub or other extra sounds. Msk:  midline occipital "tightness" but not tenderness.  FROM at neck, + tight L trapezius mm Pulses:  2+ rad pulses Extremities:  No clubbing, cyanosis, edema, or deformity noted with normal full range of motion of all joints.   Neurologic:  CN grossly intact, station and gait intact Skin:  Intact without suspicious lesions or rashes Psych:  anxious   Impression & Recommendations:  Problem # 1:  ALLERGIC RHINITIS (ICD-477.9) Discussed use of allergy medications and environmental  measures.  trial of zyrtec nightly, flonase for day use.  rec nasal saline spray.  Her updated medication list for this problem includes:    Flonase 50 Mcg/act Susp (Fluticasone propionate) .Marland Kitchen... 2 squirts into each nostril daily  Problem # 2:  NECK PAIN (ICD-723.1) likely from tension/muscle spasm.  discussed use of relaxant, pt prefers to try trial of rest and not wearing hair tight.  Problem # 3:  ANXIETY (ICD-300.00) deteriorated, increased stress, decreased exercise.  discussed increasing exercise and other healthy ways to cope with stress.  RTC if no improvement.  Complete Medication List: 1)  Asmanex 60 Metered Doses 220 Mcg/inh Aepb (Mometasone furoate) .... 2 puffs daily, disp 1 month supply 2)  Albuterol Sulfate (2.5 Mg/49ml) 0.083% Nebu (Albuterol sulfate) .... Nebulized qid as needed 3)  Nexium 40 Mg Cpdr (Esomeprazole magnesium) .... Take one tablet by mouth daily 30 minutes before breakfast 4)  Flonase 50 Mcg/act Susp (Fluticasone propionate) .... 2 squirts into each nostril daily  Patient Instructions: 1)  Use cetirizine at night. 2)  Try flonase for sinuses. 3)  Also use saline spray or neti pot.  4)  Refill asmanex. 5)  Don't wear hair too tight in back.  Increase walking. 6)  If not improving with these measures, you may need to return to get checked out again.  Cut back on caffeine. 7)  Good to see you today, call clinic with questions. Prescriptions: FLONASE 50 MCG/ACT SUSP (FLUTICASONE PROPIONATE) 2 squirts into each nostril daily  #1 x 1   Entered and Authorized by:   Eustaquio Boyden  MD   Signed by:   Eustaquio Boyden  MD on 10/06/2009   Method used:   [facility]        [facility] ([facility]ail)       [address]       [place], [place]  [address]       Ph: [phone] or [phone]       Fax: [phone]   RxID:   [id number]   Current Allergies (reviewed today): * SINGULAIR

## 2010-02-10 NOTE — Progress Notes (Signed)
Summary: asmanex, albuterol  Phone Note Refill Request Call back at Home Phone [phone] Message from:  Patient on July 04, 2009 8:57 AM  Refills Requested: Medication #1:  ASMANEX 60 METERED DOSES 220 MCG/INH AEPB 2 puffs daily  Medication #2:  ALBUTEROL SULFATE (2.5 MG/3ML) 0.083%  NEBU nebulized qid as needed. [facility]  Initial call taken by: Melody Comas,  July 04, 2009 8:57 AM  Follow-up for Phone Call        Gloria Rush should come back in to see me to double check on her asthma -- it looks like she has been having a lot of probs recently Follow-up by: Hannah Beat MD,  July 04, 2009 10:04 AM  Additional Follow-up for Phone Call Additional follow up Details #1::        Patient advised to call and make appt to follow up on asthma Additional Follow-up by: Benny Lennert CMA Duncan Dull),  July 04, 2009 10:19 AM    Prescriptions: ALBUTEROL SULFATE (2.5 MG/3ML) 0.083%  NEBU (ALBUTEROL SULFATE) nebulized qid as needed  #1 x 0   Entered and Authorized by:   Hannah Beat MD   Signed by:   Hannah Beat MD on 07/04/2009   Method used:   [facility]        [id number] ([facility]ail[facility]       [address]       [place], [place]  [address]       Ph: [phone] or [phone]       Fax: [phone]   RxID:   [id number] ASMANEX 60 METERED DOSES 220 MCG/INH AEPB (MOMETASONE FUROATE) 2 puffs daily, disp 1 month supply  #1 x 0   Entered and Authorized by:   Hannah Beat MD   Signed by:   Hannah Beat MD on 07/04/2009   Method used:   [facility]        [facility] (retail)       [address]       [place], [place]  [address]       Ph: [phone] or [phone]       Fax: [phone]   RxID:   [id number]

## 2010-02-10 NOTE — Medication Information (Signed)
Summary: Prior Authorization & Approval for Nexium/[employer]  Prior Authorization & Approval for Nexium/[employer]   Imported By: Lanelle Bal 08/13/2009 10:11:40  _____________________________________________________________________  External Attachment:    Type:   Image     Comment:   External Document

## 2010-02-10 NOTE — Progress Notes (Signed)
Summary: asthma is not any better  Phone Note Call from Patient Call back at Home Phone [phone]   Caller: Patient Summary of Call: Pt was seen on 6/9 for asthma.  She says this is not any better.  Coughing and using her inhaler every day.  Has nasal congestion also.  She is asking if you were going to give her something for the nasal congestion.  You had mentioned it but then didnt give her anything.  [facility]. Initial call taken by: Lowella Petties CMA,  June 27, 2009 12:57 PM  Follow-up for Phone Call        I will call a Zpack in for her. Ruthe Mannan MD  June 27, 2009 1:23 PM     New/Updated Medications: AZITHROMYCIN 250 MG  TABS (AZITHROMYCIN) 2 by  mouth today and then 1 daily for 4 days Prescriptions: AZITHROMYCIN 250 MG  TABS (AZITHROMYCIN) 2 by  mouth today and then 1 daily for 4 days  #6 x 0   Entered and Authorized by:   Ruthe Mannan MD   Signed by:   Ruthe Mannan MD on 06/27/2009   Method used:   [facility]        [facility] (retail)       [address]       [place], [place]  [address]       Ph: [phone] or [phone]       Fax: [phone]   RxID:   [id number]   Appended Document: asthma is not any better Advised pt.

## 2010-02-10 NOTE — Progress Notes (Signed)
Summary: prior Berkley Harvey is needed for nexium  Phone Note From Pharmacy   [id number] Summary of Call: Prior Berkley Harvey is needed for nexium, form is on your desk. Initial call taken by: Lowella Petties CMA,  August 06, 2009 11:42 AM     Appended Document: prior Berkley Harvey is needed for nexium Prior auth approval given.  Letter is on your desk for signature and scanning.

## 2010-02-12 NOTE — Assessment & Plan Note (Signed)
Summary: BREAKOUT ON FACE/CLE   Vital Signs:  Patient profile:   35 year old female Height:      65 inches Weight:      130 pounds BMI:     21.71 Temp:     97.8 degrees F oral Pulse rate:   76 / minute Pulse rhythm:   regular BP sitting:   100 / 70  (left arm) Cuff size:   regular  Vitals Entered By: Benny Lennert CMA Duncan Dull) (January 02, 2010 3:33 PM)  History of Present Illness: Chief complaint face broke out under nose  there is a 35 year old female who I know well, she presents with a break out of the last several days underneath  her nose, and has had some severely cracked  lips.  These are crusted in appearance.  Review systems: No fever, chills, or sweats.  GEN: Well-developed,well-nourished,in no acute distress; alert,appropriate and cooperative throughout examination HEENT: Normocephalic and atraumatic without obvious abnormalities. No apparent alopecia or balding. Ears, externally no deformities PULM: Breathing comfortably in no respiratory distress EXT: No clubbing, cyanosis, or edema PSYCH: Normally interactive. Cooperative during the interview. Pleasant. Friendly and conversant. Not anxious or depressed appearing. Normal, full affect.   skin: small area approximately 1 cm in area with vesicular  crusted yellowish  lesions  inferior to the nose and  caudal to the lip.  Impression: Impetigo.  Allergies: 1)  ! * Advair 2)  * Singulair   Impression & Recommendations:  Problem # 1:  IMPETIGO (EAV-409) Assessment New diclox mupirocin  Complete Medication List: 1)  Asmanex 60 Metered Doses 220 Mcg/inh Aepb (Mometasone furoate) .... 2 puffs daily, disp 1 month supply 2)  Albuterol Sulfate (2.5 Mg/67ml) 0.083% Nebu (Albuterol sulfate) .... Nebulized qid as needed 3)  Nexium 40 Mg Cpdr (Esomeprazole magnesium) .... Take one tablet by mouth daily 30 minutes before breakfast 4)  Fluconazole 150 Mg Tabs (Fluconazole) .Marland Kitchen.. 1 by mouth daily x 3 weeks 5)  Lexapro 10  Mg Tabs (Escitalopram oxalate) .... Take one tablet daily 6)  Mupirocin 2 % Oint (Mupirocin) .... Apply three times a day for 7 days 7)  Dicloxacillin Sodium 500 Mg Caps (Dicloxacillin sodium) .Marland Kitchen.. 1 by mouth 4 times daily  Patient Instructions: 1)  call if not better Prescriptions: DICLOXACILLIN SODIUM 500 MG CAPS (DICLOXACILLIN SODIUM) 1 by mouth 4 times daily  #28 x 0   Entered and Authorized by:   Hannah Beat MD   Signed by:   Hannah Beat MD on 01/02/2010   Method used:   [facility]        [id number]ail)       [address]       [place], [place]  [address]       Ph: [phone] or [phone]       Fax: [phone]   RxID:   [id number] MUPIROCIN 2 % OINT (MUPIROCIN) Apply three times a day for 7 days  #1 tube x 0   Entered and Authorized by:   Hannah Beat MD   Signed by:   Hannah Beat MD on 01/02/2010   Method used:   [facility]        [facility] (retail)       [address]       [place], [place]  [address]       Ph: [phone] or [phone]       Fax: [phone]   RxID:   [id number]  Orders Added: 1)  Est. Patient Level III [[address]]    Current Allergies (reviewed today): ! * ADVAIR * SINGULAIR

## 2010-02-12 NOTE — Progress Notes (Signed)
Summary: red spots after impetigo  Phone Note Call from Patient Call back at [phone]   Caller: Patient Call For: Dr. Ermalene Searing Summary of Call: Pt was treated last month for impetigo on her face.  The sores are gone but there are still some red spots that sometimes feel irritated.  She is using the topical cream that she got.  Pt is asking if the red spots are normal after the infection, or does she need to take more antibiotic. Initial call taken by: Lowella Petties CMA, AAMA,  January 26, 2010 11:45 AM  Follow-up for Phone Call        reasonable to keep up topical, after infection or any type of rash there can be inflammatory response in skin that can take weeks to months to resolve in coloration.  Follow-up by: Hannah Beat MD,  January 26, 2010 4:07 PM  Additional Follow-up for Phone Call Additional follow up Details #1::        Patient advised.Consuello Masse CMA   Additional Follow-up by: Benny Lennert CMA Duncan Dull),  January 27, 2010 8:29 AM

## 2010-02-12 NOTE — Progress Notes (Signed)
Summary: samples of lexapro given  Phone Note Call from Patient   Caller: Patient Call For: Hannah Beat MD Summary of Call: Pt requests one sample box of lexapro, one box of 7 tablets given.  Lot # [id number] exp 02/2013. Initial call taken by: Lowella Petties CMA, AAMA,  January 21, 2010 8:39 AM  Follow-up for Phone Call        ok Follow-up by: Hannah Beat MD,  January 21, 2010 9:10 AM

## 2010-02-12 NOTE — Progress Notes (Signed)
Summary: call a nurse   Phone Note Call from Patient   Caller: Patient Call For: Hannah Beat MD Summary of Call: Triage Record Num: [id number] Operator: Alphonsa Overall Patient Name: Gloria Rush Call Date & Time: 12/20/2009 7:59:43PM Patient Phone: [phone] PCP: Hannah Beat Patient Gender: Female PCP Fax : [phone] Patient DOB: 01/22/1975 Practice Name[facility] Reason for Call: LMP 12/18/09. Gloria Rush having urinary discomfort. Onset 12/20/09. Cramping, pressure, some back discomfort. Nausea/vomiting 12/19/09. Afebrile-99.3 at 1930. Home care advice given. Brittish aware needs to be evaluated within 4 hours. Protocol(s) Used: Urinary Symptoms - Female Recommended Outcome per Protocol: See Kato Wieczorek within 4 hours Reason for Outcome: Urinary tract symptoms AND any flank or low back pain Care Advice:  ~ 12/ Initial call taken by: Melody Comas,  December 22, 2009 8:13 AM  Follow-up for Phone Call        call and check on Gloria Rush. See if symptoms persist - if so, will need UTI eval. (but I have no availability until Thursday.) Follow-up by: Hannah Beat MD,  December 22, 2009 8:38 AM  Additional Follow-up for Phone Call Additional follow up Details #1::        Spoke to patient and she is feeling alot better.Consuello Masse CMA   Additional Follow-up by: Benny Lennert CMA Duncan Dull),  December 22, 2009 8:44 AM

## 2010-02-12 NOTE — Assessment & Plan Note (Signed)
Summary: 1 M F/U DLO   Vital Signs:  Patient profile:   35 year old female Height:      65 inches Weight:      132.0 pounds BMI:     22.05 Temp:     98.6 degrees F oral Pulse rate:   76 / minute Pulse rhythm:   regular BP sitting:   100 / 70  (left arm) Cuff size:   regular  Vitals Entered By: Benny Lennert CMA Duncan Dull) (February 05, 2010 4:07 PM)  History of Present Illness: Chief complaint 1 month follow up   Depression and anxiety: the patient is out of Lexapro 10 mg for more than a month. She feels as if she is an entirely different person.  She now feels happy, and does not feel  the feel of depression  over her life.  She has more energy and wants to do things. She is more interactive in the workplace. She is more interactive at home and with her husband. She is sleeping 7-8 hours a night. She denies any manic symptoms. No flight of ideas, no unusual activity out of turgor, no extra spending, no  cheating on her husband.  Overall, she is dramatically improved, and has had no anxiety attacks  Asthma:rrelatively stable,  continues to use her Asmanex daily, but has run out of her albuterol.  Allergic rhinitis:would like to try  Flonase again. When her anxiety was out of control, she would have concerns about virtually all medications, and would not give this a significant trial.  Allergies: 1)  ! * Advair 2)  * Singulair  Past History:  Past medical, surgical, family and social histories (including risk factors) reviewed, and no changes noted (except as noted below).  Past Medical History: Reviewed history from 12/23/2008 and no changes required. Allergic rhinitis and conjunctivitis Asthma Gerd Anxiety  Past Surgical History: Reviewed history from 12/27/2006 and no changes required. thyroid nodules, bx benign  Family History: Reviewed history from 12/27/2006 and no changes required. father healthy mother: anxiety brother: anxiety sister: healthy MGF: DM PGF:  MI PGM: ? MGM: healthy  Social History: Reviewed history from 10/06/2009 and no changes required. 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 CV: No chest pain or SOB GI: No noted N or V Otherwise, pertinent positives and negatives are noted in the HPI.   Physical Exam  Additional Exam:  GEN: WDWN, NAD, Non-toxic, A & O x 3 HEENT: Atraumatic, Normocephalic. Neck supple. No masses, No LAD. Ears and Nose: No external deformity. CV: RRR, No M/G/R. No JVD. No thrill. No extra heart sounds. PULM: CTA B, no wheezes, crackles, rhonchi. No retractions. No resp. distress. No accessory muscle use. EXTR: No c/c/e NEURO: Normal gait.  PSYCH: Normally interactive. Conversant. Not depressed or anxious appearing.  Calm demeanor.     Impression & Recommendations:  Problem # 1:  DEPRESSIVE DISORDER (ICD-311) Assessment Improved much improved.  Her updated medication list for this problem includes:    Lexapro 10 Mg Tabs (Escitalopram oxalate) .Marland Kitchen... Take one tablet daily  Problem # 2:  ANXIETY (ICD-300.00) Assessment: Improved  Her updated medication list for this problem includes:    Lexapro 10 Mg Tabs (Escitalopram oxalate) .Marland Kitchen... Take one tablet daily  Problem # 3:  ALLERGIC RHINITIS (ICD-477.9) Assessment: Deteriorated  Her updated medication list for this problem includes:    Fluticasone Propionate 50 Mcg/act Susp (Fluticasone propionate) .Marland Kitchen... 2 sprays each nostril once daily  Discussed use of allergy medications and environmental measures.   Problem # 4:  ASTHMA (ICD-493.90) Assessment: Improved refill albuterol.  Her updated medication list for this problem includes:    Asmanex 60 Metered Doses 220 Mcg/inh Aepb (Mometasone furoate) .Marland Kitchen... 2  puffs daily, disp 1 month supply    Albuterol Sulfate (2.5 Mg/31ml) 0.083% Nebu (Albuterol sulfate) ..... Nebulized qid as needed  Complete Medication List: 1)  Asmanex 60 Metered Doses 220 Mcg/inh Aepb (Mometasone furoate) .... 2 puffs daily, disp 1 month supply 2)  Albuterol Sulfate (2.5 Mg/37ml) 0.083% Nebu (Albuterol sulfate) .... Nebulized qid as needed 3)  Nexium 40 Mg Cpdr (Esomeprazole magnesium) .... Take one tablet by mouth daily 30 minutes before breakfast 4)  Lexapro 10 Mg Tabs (Escitalopram oxalate) .... Take one tablet daily 5)  Fluticasone Propionate 50 Mcg/act Susp (Fluticasone propionate) .... 2 sprays each nostril once daily Prescriptions: FLUTICASONE PROPIONATE 50 MCG/ACT  SUSP (FLUTICASONE PROPIONATE) 2 sprays each nostril once daily  #1 vial x 11   Entered and Authorized by:   Hannah Beat MD   Signed by:   Hannah Beat MD on 02/06/2010   Method used:   Handwritten   RxID:   [id number] ALBUTEROL SULFATE (2.5 MG/3ML) 0.083%  NEBU (ALBUTEROL SULFATE) nebulized qid as needed  #1 x 3   Entered and Authorized by:   Hannah Beat MD   Signed by:   Hannah Beat MD on 02/06/2010   Method used:   Handwritten   RxID:   [id number]    Orders Added: 1)  Est. Patient Level IV [[address]]    Current Allergies (reviewed today): ! * ADVAIR * SINGULAIR

## 2010-02-12 NOTE — Progress Notes (Signed)
Summary: called regarding meds  Phone Note Call from Patient   Caller: Patient Call For: Hannah Beat MD Summary of Call: Pt called to report that she took citalopram at 6:00 last night.  Woke up around 2 feeling very anxious so she took one half of a clonazepam.  She asked if that was ok, advised yes. Ok to take this as an emergency medicine but dont take over the prescribed amount.  Also, she says she too diflucan and vomited about 3 times.  She took that on an empty stomach, I advised her to try taking with food and see if that helps.  She will call back if no improvement. Initial call taken by: Lowella Petties CMA, AAMA,  December 19, 2009 3:01 PM  Follow-up for Phone Call        ok  call alahia - tell her for the next week to take a 1/2 of the clonazepam two times a day scheduled. This should help until the citalopram starts to kick in. Follow-up by: Hannah Beat MD,  December 20, 2009 5:25 AM  Additional Follow-up for Phone Call Additional follow up Details #1::        Patient was told by pharmacy to stop the diflucan. She was advised of your instruction with the medication.Consuello Masse CMA    Please advise about the diflucan should she take this or not.Consuello Masse CMA   Additional Follow-up by: Benny Lennert CMA Duncan Dull),  December 22, 2009 7:58 AM    Additional Follow-up for Phone Call Additional follow up Details #2::    i would cont with diflucan, agree nausea more likely from antidepressant -- that usually gets better in first week or so and goes away.  continue diflucan, take with meal. Hannah Beat MD  December 22, 2009 8:40 AM   Patient says that she is not eating very much and she doesnt know if its from her new medication also she has been having muscle spasms about an hour after she takes this.Consuello Masse CMA   Follow-up by: Benny Lennert CMA Duncan Dull),  December 22, 2009 8:49 AM

## 2010-02-12 NOTE — Progress Notes (Signed)
Summary: wants to change to lexapro  Phone Note Call from Patient Call back at Home Phone [phone]   Caller: Patient Call For: Hannah Beat MD Summary of Call: Pt wants to change from citalopram to name brand lexapro  because of the side effects.  She is having vomiting and feels like depression is getting worse.  [facility].  She has only been on this for four days but she doesnt like how she feels.  She doesnt want you to be mad at her or think that she is going against what you tell her to do, she says she has to do what is best for her. Initial call taken by: Lowella Petties CMA, AAMA,  December 22, 2009 9:21 AM  Follow-up for Phone Call        d/c celexa  start lexapro 10 mg by mouth daily #30, 0 refills  f/u with me in 2-3 weeks if continues to worsen, i want her to see psychiatry. Follow-up by: Hannah Beat MD,  December 22, 2009 10:10 AM    New/Updated Medications: LEXAPRO 10 MG TABS (ESCITALOPRAM OXALATE) Take one tablet daily Prescriptions: LEXAPRO 10 MG TABS (ESCITALOPRAM OXALATE) Take one tablet daily  #30 x 0   Entered by:   Benny Lennert CMA (AAMA)   Authorized by:   Hannah Beat MD   Signed by:   Benny Lennert CMA (AAMA) on 12/22/2009   Method used:   [facility] to        [id number]retail[facility]       [address]       [place], [place]  [address]       Ph: [phone] or [phone]       Fax: [phone]   RxID:   [id number]   Prior Medications: ASMANEX 60 METERED DOSES 220 MCG/INH AEPB (MOMETASONE FUROATE) 2 puffs daily, disp 1 month supply ALBUTEROL SULFATE (2.5 MG/3ML) 0.083%  NEBU (ALBUTEROL SULFATE) nebulized qid as needed NEXIUM 40 MG CPDR (ESOMEPRAZOLE MAGNESIUM) take one tablet by mouth daily 30 minutes before breakfast NYSTATIN 100000 UNIT/ML SUSP (NYSTATIN) swish 4 times daily CLONAZEPAM 0.5 MG TABS (CLONAZEPAM) 1 by mouth two times a day as needed anxiety FLUCONAZOLE 150 MG TABS  (FLUCONAZOLE) 1 by mouth daily x 3 weeks Current Allergies: ! * ADVAIR * SINGULAIR

## 2010-03-28 ENCOUNTER — Encounter: Payer: Self-pay | Admitting: Family Medicine

## 2010-06-23 ENCOUNTER — Telehealth: Payer: Self-pay | Admitting: *Deleted

## 2010-06-23 NOTE — Telephone Encounter (Signed)
Patient brought in adoption papers to be filled out. Forms are on your desk.

## 2010-07-09 ENCOUNTER — Ambulatory Visit (INDEPENDENT_AMBULATORY_CARE_PROVIDER_SITE_OTHER): Payer: 59 | Admitting: Family Medicine

## 2010-07-09 ENCOUNTER — Encounter: Payer: Self-pay | Admitting: Family Medicine

## 2010-07-09 VITALS — BP 110/70 | HR 81 | Temp 97.9°F | Ht 64.0 in | Wt 133.4 lb

## 2010-07-09 DIAGNOSIS — F411 Generalized anxiety disorder: Secondary | ICD-10-CM

## 2010-07-09 DIAGNOSIS — Z209 Contact with and (suspected) exposure to unspecified communicable disease: Secondary | ICD-10-CM | POA: Insufficient documentation

## 2010-07-09 DIAGNOSIS — J45909 Unspecified asthma, uncomplicated: Secondary | ICD-10-CM

## 2010-07-09 MED ORDER — NORGESTIM-ETH ESTRAD TRIPHASIC 0.18/0.215/0.25 MG-35 MCG PO TABS: ORAL_TABLET | ORAL | Status: DC

## 2010-07-09 NOTE — Patient Instructions (Signed)
COME BACK FOR PPD PLACEMENT ON MONDAY

## 2010-07-11 ENCOUNTER — Encounter: Payer: Self-pay | Admitting: Family Medicine

## 2010-07-11 NOTE — Progress Notes (Signed)
Gloria Rush, a 35 y.o. female presents today in the office for the following:   Patient very well-known to me, who is coming in to the office today for followup and checkup, and for completion of some forms for adoption. The patient and her husband have been attempting to conceive for some time. At this point, they're proceeding with adoption.  The patient is generally very healthy, and in excellent health. She does have some asthma that is well controlled. Historically, she did have some anxiety, but she is very stable and well controlled now on regular Lexapro. Other than these issues, the patient is overall been very healthy, and is a conscientious, friendly patient.  Patient Active Problem List  Diagnoses  . DEPRESSIVE DISORDER  . ALLERGIC CONJUNCTIVITIS  . ALLERGIC RHINITIS  . ASTHMA  . GERD  . DERMATITIS, ATOPIC  . FATIGUE  . ANXIETY  . Exposure to communicable disease   Past Medical History  Diagnosis Date  . Allergy   . Asthma   . Anxiety    No past surgical history on file. History  Substance Use Topics  . Smoking status: Never Smoker   . Smokeless tobacco: Not on file  . Alcohol Use: No   Family History  Problem Relation Age of Onset  . Anxiety disorder Mother   . Anxiety disorder Brother   . Diabetes Maternal Grandfather   . Heart disease Paternal Grandmother     heart attack   Allergies  Allergen Reactions  . Advair Hfa     REACTION: makes her anxious  . Montelukast Sodium     REACTION: Anxiety   Current Outpatient Prescriptions on File Prior to Visit  Medication Sig Dispense Refill  . escitalopram (LEXAPRO) 10 MG tablet Take 10 mg by mouth daily.        Marland Kitchen esomeprazole (NEXIUM) 40 MG capsule Take 40 mg by mouth daily before breakfast.        . FLUTICASONE PROPIONATE, NASAL, NA 2 sprays by Nasal route daily.        . mometasone (ASMANEX 60 METERED DOSES) 220 MCG/INH inhaler Inhale 2 puffs into the lungs daily.        Marland Kitchen albuterol (PROVENTIL) (2.5  MG/3ML) 0.083% nebulizer solution Take 2.5 mg by nebulization every 6 (six) hours as needed.         ROS: GEN: No acute illnesses, no fevers, chills. GI: No n/v/d, eating normally Pulm: No SOB Interactive and getting along well at home.  Otherwise, ROS is as per the HPI.   Physical Exam  Blood pressure 110/70, pulse 81, temperature 97.9 F (36.6 C), temperature source Oral, height 5\' 4"  (1.626 m), weight 133 lb 6.4 oz (60.51 kg), SpO2 96.00%.  GEN: WDWN, NAD, Non-toxic, A & O x 3 HEENT: Atraumatic, Normocephalic. Neck supple. No masses, No LAD. Ears and Nose: No external deformity. CV: RRR, No M/G/R. No JVD. No thrill. No extra heart sounds. PULM: CTA B, no wheezes, crackles, rhonchi. No retractions. No resp. distress. No accessory muscle use. EXTR: No c/c/e NEURO Normal gait.  PSYCH: Normally interactive. Conversant. Not depressed or anxious appearing.  Calm demeanor.   Assessment and Plan: 1.  Asthma, stable and well controlled. 2. Anxiety: Stable, doing very well with Lexapro, and very compliant. 3. Forms completed for adoption proceedings. HIV checked for this adoption. Also will check a PPD, which is another requirement for adoption. No concerns from a medical or psychological standpoint in regards to adoption. Very nice patient and family.

## 2010-07-13 ENCOUNTER — Ambulatory Visit: Payer: 59

## 2010-07-14 ENCOUNTER — Ambulatory Visit (INDEPENDENT_AMBULATORY_CARE_PROVIDER_SITE_OTHER): Payer: 59 | Admitting: Family Medicine

## 2010-07-14 DIAGNOSIS — Z111 Encounter for screening for respiratory tuberculosis: Secondary | ICD-10-CM

## 2010-07-15 NOTE — Progress Notes (Signed)
  Subjective:    Patient ID: Gloria Rush, female    DOB: Feb 01, 1975, 35 y.o.   MRN: [id number]  HPI Ppd placed  No charge -- our office made scheduling error last we so we could not do at same time of OV.  Review of Systems     Objective:   Physical Exam        Assessment & Plan:

## 2010-07-16 ENCOUNTER — Encounter: Payer: Self-pay | Admitting: *Deleted

## 2010-07-16 ENCOUNTER — Ambulatory Visit (INDEPENDENT_AMBULATORY_CARE_PROVIDER_SITE_OTHER): Payer: 59 | Admitting: Family Medicine

## 2010-07-16 DIAGNOSIS — IMO0001 Reserved for inherently not codable concepts without codable children: Secondary | ICD-10-CM

## 2010-07-16 DIAGNOSIS — Z111 Encounter for screening for respiratory tuberculosis: Secondary | ICD-10-CM

## 2010-07-16 LAB — TB SKIN TEST
Induration: 0
TB Skin Test: NEGATIVE mm

## 2010-07-17 NOTE — Progress Notes (Signed)
  Subjective:    Patient ID: Gloria Rush, female    DOB: 10/31/1975, 35 y.o.   MRN: [id number]  HPI  No charge, scheduling error by our staff initially. Hannah Beat, MD  Review of Systems     Objective:   Physical Exam        Assessment & Plan:

## 2010-09-03 ENCOUNTER — Telehealth: Payer: Self-pay | Admitting: *Deleted

## 2010-09-03 NOTE — Telephone Encounter (Signed)
Prior auth needed for nexium , form is on your desk.

## 2010-09-04 NOTE — Telephone Encounter (Signed)
Will complete 

## 2010-09-07 NOTE — Telephone Encounter (Signed)
Prior auth given for nexium, advised pharmacy.  Approval letter placed on doctor's desk for signature and scanning. 

## 2010-09-15 ENCOUNTER — Encounter: Payer: Self-pay | Admitting: Family Medicine

## 2010-09-15 ENCOUNTER — Ambulatory Visit (INDEPENDENT_AMBULATORY_CARE_PROVIDER_SITE_OTHER): Payer: 59 | Admitting: Family Medicine

## 2010-09-15 ENCOUNTER — Ambulatory Visit: Payer: 59 | Admitting: Family Medicine

## 2010-09-15 DIAGNOSIS — J069 Acute upper respiratory infection, unspecified: Secondary | ICD-10-CM | POA: Insufficient documentation

## 2010-09-15 NOTE — Patient Instructions (Signed)
I think you likely have a summertime cold with will improve on its own.  Get some nasal saline and use it several times a day.  I think your back will gradually improve.  Take care.

## 2010-09-15 NOTE — Progress Notes (Signed)
duration of symptoms: ~1 week Rhinorrhea:yes Congestion: yes ear pain: some ear pressure sore throat: itchy and scratchy Cough: no Myalgias: yes other concerns: taking pseudophed.  Sneezing more than normal.  Pressure on face.  Minimally lightheaded "like my head is underwater."   No wheeze, chest feels minimally tight.  Hasn't had to use nebulizer or rescue inhaler.   No fever.   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   L lower back is sore, "I think I pulled something."  Worse with certain positon changes.    ROS: See HPI.  Otherwise negative.    Meds, vitals, and allergies reviewed.   GEN: nad, alert and oriented HEENT: mucous membranes moist, TM w/o erythema, nasal epithelium minimally injected, OP with mild cobblestoning NECK: supple w/o LA CV: rrr. PULM: ctab, no inc wob ABD: soft, +bs EXT: no edema Back w/o CVA pain but she has some mild lower L lumbar paraspinal tenderness on palpation.

## 2010-09-16 NOTE — Assessment & Plan Note (Signed)
Likely viral.  She has minimal use of SABA, she isn't decompensated and lungs are ctab.  I'd use SABA prn for cough and this should gradually resolve.  ddx d/w pt and she agrees, okay for outpatient f/u. Supportive tx in meantime.

## 2010-09-22 ENCOUNTER — Telehealth: Payer: Self-pay | Admitting: *Deleted

## 2010-09-22 MED ORDER — AZITHROMYCIN 250 MG PO TABS: ORAL_TABLET | ORAL | Status: AC

## 2010-09-22 NOTE — Telephone Encounter (Signed)
Patient advised.

## 2010-09-22 NOTE — Telephone Encounter (Signed)
Pt was seen last week for what was thought was a viral URI.  She is not any better- cough, sinus congestion, drainage.  Worse at night. No fever.  She is asking that an antibiotic be called to [facility] in [place].

## 2010-09-22 NOTE — Telephone Encounter (Signed)
Sent. Please notify pt.  Thanks.  °

## 2010-10-06 ENCOUNTER — Telehealth: Payer: Self-pay | Admitting: *Deleted

## 2010-10-06 NOTE — Telephone Encounter (Signed)
Patient found a dog tick on her this morning and removed it. Patient used peroxide and neosporin on it. Patient wants to know if she should be on an antibiotic for this or should she be concerned? Please advise.

## 2010-10-06 NOTE — Telephone Encounter (Signed)
Spoke with Dr.Copland and he advised as long as patient feels fine nothing needs to be done.

## 2010-10-06 NOTE — Telephone Encounter (Signed)
Patient advised.

## 2010-10-23 ENCOUNTER — Other Ambulatory Visit: Payer: Self-pay | Admitting: Family Medicine

## 2010-11-11 ENCOUNTER — Ambulatory Visit: Payer: 59

## 2010-11-17 ENCOUNTER — Ambulatory Visit: Payer: 59

## 2010-11-18 ENCOUNTER — Ambulatory Visit: Payer: 59

## 2010-12-08 ENCOUNTER — Ambulatory Visit (INDEPENDENT_AMBULATORY_CARE_PROVIDER_SITE_OTHER): Payer: 59 | Admitting: Family Medicine

## 2010-12-08 ENCOUNTER — Encounter: Payer: Self-pay | Admitting: Family Medicine

## 2010-12-08 DIAGNOSIS — J0100 Acute maxillary sinusitis, unspecified: Secondary | ICD-10-CM

## 2010-12-08 MED ORDER — AMOXICILLIN 500 MG PO CAPS: 1000.0000 mg | ORAL_CAPSULE | Freq: Two times a day (BID) | ORAL | Status: AC

## 2010-12-08 NOTE — Progress Notes (Signed)
  Patient Name: Gloria Rush Date of Birth: [birth year] Age: 35 y.o. Medical Record Number: [id number] Gender: female  History of Present Illness:  Gloria Rush is a 35 y.o. very pleasant female patient who presents with the following:  Working and has been sick a couple of weeks. Pain in the maxillary sinuses. Really tight. Some pain in her chest. Has used her inhaler.  Had maybe a little wheezing when near the wood stove.   Sore throat.   Sinus pain at max.  Also had some diarrhea a few days ago, now resolved. Not really wheezing, no SOB, no productive cough  Past Medical History, Surgical History, Social History, Family History, and Problem List have been reviewed in EHR and updated if relevant.  Review of Systems: ROS: GEN: Acute illness details above GI: Tolerating PO intake GU: maintaining adequate hydration and urination Pulm: No SOB Interactive and getting along well at home.  Otherwise, ROS is as per the HPI.   Physical Examination: Filed Vitals:   12/08/10 1413  BP: 100/70  Pulse: 69  Temp: 97.7 F (36.5 C)  TempSrc: Oral  Height: 5\' 5"  (1.651 m)  Weight: 129 lb (58.514 kg)  SpO2: 93%     Gen: WDWN, NAD; alert,appropriate and cooperative throughout exam  HEENT: Normocephalic and atraumatic. Throat clear, w/o exudate, no LAD, R TM clear, L TM - good landmarks, No fluid present. rhinnorhea.  Left frontal and maxillary sinuses: Tender, max Right frontal and maxillary sinuses: Tenderat max  Neck: No ant or post LAD CV: RRR, No M/G/R Pulm: Breathing comfortably in no resp distress. no w/c/r Abd: S,NT,ND,+BS Extr: no c/c/e Psych: full affect, pleasant   Assessment and Plan: Acute sinusitis: ABX as below.  Refer to the patient instructions sections for details of plan shared with patient.  Reviewed symptomatic care as well as ABX in this case.

## 2010-12-09 ENCOUNTER — Ambulatory Visit: Payer: 59 | Admitting: Family Medicine

## 2010-12-24 ENCOUNTER — Ambulatory Visit (INDEPENDENT_AMBULATORY_CARE_PROVIDER_SITE_OTHER): Payer: 59

## 2010-12-24 DIAGNOSIS — Z23 Encounter for immunization: Secondary | ICD-10-CM

## 2010-12-26 ENCOUNTER — Other Ambulatory Visit: Payer: Self-pay | Admitting: Family Medicine

## 2011-01-01 ENCOUNTER — Other Ambulatory Visit: Payer: Self-pay | Admitting: Family Medicine

## 2011-02-03 ENCOUNTER — Ambulatory Visit (INDEPENDENT_AMBULATORY_CARE_PROVIDER_SITE_OTHER): Payer: 59 | Admitting: Family Medicine

## 2011-02-03 ENCOUNTER — Encounter: Payer: Self-pay | Admitting: *Deleted

## 2011-02-03 ENCOUNTER — Encounter: Payer: Self-pay | Admitting: Family Medicine

## 2011-02-03 DIAGNOSIS — J45909 Unspecified asthma, uncomplicated: Secondary | ICD-10-CM

## 2011-02-03 DIAGNOSIS — J209 Acute bronchitis, unspecified: Secondary | ICD-10-CM

## 2011-02-03 DIAGNOSIS — J45901 Unspecified asthma with (acute) exacerbation: Secondary | ICD-10-CM

## 2011-02-03 MED ORDER — AZITHROMYCIN 250 MG PO TABS: ORAL_TABLET | ORAL | Status: AC

## 2011-02-03 MED ORDER — PREDNISONE 20 MG PO TABS: ORAL_TABLET | ORAL | Status: AC

## 2011-02-03 NOTE — Progress Notes (Signed)
  Patient Name: Gloria Rush Date of Birth: 1975-08-15 Age: 36 y.o. Medical Record Number: [id number] Gender: female Date of Encounter: 02/03/2011  History of Present Illness:  Gloria Rush is a 36 y.o. very pleasant female patient who presents with the following:  Some pain on the right side. Felt a little better with an inhaler. Coughing nothing really up. Some nasal congestion and some yellow - back of throat.   99.9  A couple of days.  Very pleasant patient who for a couple of days has had some tightness in a little bit of trouble breathing as well as some coughing without production of significant sputum. She does have a history of asthma and is compliant with using her inhaled steroids. She has had some nasal congestion and had a low-grade fever of about 100.  She has been slightly achy, but no significant continuous body aches, myalgias, or arthralgias. She did get a flu shot  Past Medical History, Surgical History, Social History, Family History, Problem List, Medications, and Allergies have been reviewed and updated if relevant.  Review of Systems: ROS: GEN: Acute illness details above GI: Tolerating PO intake GU: maintaining adequate hydration and urination Pulm: No SOB Interactive and getting along well at home.  Otherwise, ROS is as per the HPI.   Physical Examination: Filed Vitals:   02/03/11 1554  BP: 120/70  Pulse: 55  Temp: 97.6 F (36.4 C)  TempSrc: Oral  Height: 5\' 5"  (1.651 m)  Weight: 129 lb 1.9 oz (58.568 kg)  SpO2: 95%    Body mass index is 21.49 kg/(m^2).   GEN: A and O x 3. WDWN. NAD.    ENT: Nose clear, ext NML.  No LAD.  No JVD.  TM's clear. Oropharynx clear.  PULM: Normal WOB, no distress. Some decreased air movement with minimal wheezing and rhonchi. Rhonchorous sounds are greatest at the right lung base.  CV: RRR, no M/G/R, No rubs, No JVD.   EXT: warm and well-perfused, No c/c/e. PSYCH: Pleasant and conversant.   Assessment and  Plan: 1. Bronchitis with asthma, acute  azithromycin (ZITHROMAX Z-PAK) 250 MG tablet, predniSONE (DELTASONE) 20 MG tablet  2. Asthma attack  azithromycin (ZITHROMAX Z-PAK) 250 MG tablet, predniSONE (DELTASONE) 20 MG tablet    We also discussed that it is possible that she could have a mild case of influenza. Nevertheless I think that a lot of her symptoms are coming from her asthma that has flared up, and suspect that she will do better with some oral prednisone.

## 2011-02-19 ENCOUNTER — Other Ambulatory Visit: Payer: Self-pay | Admitting: Family Medicine

## 2011-03-15 ENCOUNTER — Telehealth: Payer: Self-pay | Admitting: Family Medicine

## 2011-03-15 MED ORDER — MUPIROCIN 2 % EX OINT: TOPICAL_OINTMENT | Freq: Three times a day (TID) | CUTANEOUS | Status: DC

## 2011-03-15 NOTE — Telephone Encounter (Signed)
Called pt and L/M that the RX was sent in her appt for tomorrow was cancelled.

## 2011-03-15 NOTE — Telephone Encounter (Signed)
Pt has place in her nose and a sinus infection. She has had the place in her nose before and was prescribed a cream. She was wondering if she could have that cream called in to her Pharmacy.

## 2011-03-15 NOTE — Telephone Encounter (Signed)
Triage Record Num: [id number] Operator: Amy Head Patient Name: Gloria Rush Call Date & Time: 03/15/2011 3:04:12PM Patient Phone: [phone] PCP: Hannah Beat Patient Gender: Female PCP Fax : [phone] Patient DOB: 09-26-1975 Practice Name: [facility] Reason for Call: Caller: Sharleen/Patient; PCP: Hannah Beat T.; CB#: [phone]; ; ; Call regarding Has Impetigo To Nose Inner and Outer. Onset 03/15/11. Unsure of Temp. White Bumps To [employer] With Yellow Crusty Drainage.; Wants to know if Mupirocin 2% Ointment can be called in without an office visit. Has appt scheduled for tomorrow and really doesn't have the money. Protocol(s) Used: Office Note Recommended Outcome per Protocol: Information Noted and Sent to Office Reason for Outcome: Caller information to office Care Advice: ~ 03/15/2011 3:18:53PM Page 1 of 1 [id number]

## 2011-03-15 NOTE — Telephone Encounter (Signed)
i just sent it in --- i am ok if she f/u only if she does not get better. I have seen her a number of times for this

## 2011-03-16 ENCOUNTER — Ambulatory Visit: Payer: 59 | Admitting: Family Medicine

## 2011-03-24 ENCOUNTER — Telehealth: Payer: Self-pay | Admitting: *Deleted

## 2011-03-24 MED ORDER — FLUCONAZOLE 150 MG PO TABS: ORAL_TABLET | ORAL | Status: DC

## 2011-03-24 NOTE — Telephone Encounter (Signed)
Diflucan 150 mg, 1 po x 1, may repeat in 1 week if needed, #2

## 2011-03-24 NOTE — Telephone Encounter (Signed)
Pt states she has a yeast infection and is requesting generic diflucan.  Has itching and discharge.  She is having menses now so she cant use monostat.  Uses [facility] in [place].

## 2011-03-24 NOTE — Telephone Encounter (Signed)
Rx sent electronically to [facility], left message on cell phone voicemail advising patient as instructed.

## 2011-04-04 ENCOUNTER — Other Ambulatory Visit: Payer: Self-pay | Admitting: Family Medicine

## 2011-04-07 ENCOUNTER — Encounter: Payer: Self-pay | Admitting: Family Medicine

## 2011-04-07 ENCOUNTER — Ambulatory Visit (INDEPENDENT_AMBULATORY_CARE_PROVIDER_SITE_OTHER): Payer: 59 | Admitting: Family Medicine

## 2011-04-07 VITALS — BP 110/70 | HR 71 | Temp 97.8°F | Ht 65.0 in | Wt 133.4 lb

## 2011-04-07 DIAGNOSIS — J0100 Acute maxillary sinusitis, unspecified: Secondary | ICD-10-CM

## 2011-04-07 DIAGNOSIS — J309 Allergic rhinitis, unspecified: Secondary | ICD-10-CM

## 2011-04-07 DIAGNOSIS — J45909 Unspecified asthma, uncomplicated: Secondary | ICD-10-CM

## 2011-04-07 MED ORDER — AMOXICILLIN 500 MG PO CAPS: 1000.0000 mg | ORAL_CAPSULE | Freq: Two times a day (BID) | ORAL | Status: AC

## 2011-04-07 MED ORDER — FLUTICASONE PROPIONATE 50 MCG/ACT NA SUSP: 2.0000 | Freq: Every day | NASAL | Status: DC

## 2011-04-07 NOTE — Patient Instructions (Signed)
ALLEGRA (PLAIN FEXOFENADINE) ZYRTEC EITHER - CAN TAKE IN THE MORNING

## 2011-04-07 NOTE — Progress Notes (Signed)
  Patient Name: Gloria Rush Date of Birth: 1975-04-26 Age: 36 y.o. Medical Record Number: [id number] Gender: female Date of Encounter: 04/07/2011  History of Present Illness:  Gloria Rush is a 36 y.o. very pleasant female patient who presents with the following:  Sinuses and allregies, things coming from her nose. Now chest both sides are aching some.  Sinuses are aching some then will go away. 1 month. Takes Benadryl before bed.  Anxiety has been up and down. Has not been wheezing and no aching.  Now with some pain in the maxillary region  No wheezing, but chest has been tight Has used about once a day albuterol.  Doing asmanex every day  Was at a dog / party last Saturday - flared asthma up  Past Medical History, Surgical History, Social History, Family History, Problem List, Medications, and Allergies have been reviewed and updated if relevant.  Review of Systems: ROS: GEN: Acute illness details above GI: Tolerating PO intake GU: maintaining adequate hydration and urination Pulm: No SOB Interactive and getting along well at home.  Otherwise, ROS is as per the HPI.   Physical Examination: Filed Vitals:   04/07/11 1542  BP: 110/70  Pulse: 71  Temp: 97.8 F (36.6 C)  TempSrc: Oral  Height: 5\' 5"  (1.651 m)  Weight: 133 lb 6.4 oz (60.51 kg)  SpO2: 100%    Body mass index is 22.20 kg/(m^2).   Gen: WDWN, NAD; alert,appropriate and cooperative throughout exam  HEENT: Normocephalic and atraumatic. Throat clear, w/o exudate, no LAD, R TM clear, L TM - good landmarks, No fluid present. No rhinnorhea.  Left frontal and maxillary sinuses: Tender max Right frontal and maxillary sinuses: Tendermax  Neck: No ant or post LAD CV: RRR, No M/G/R Pulm: Breathing comfortably in no resp distress. no w/c/r Abd: S,NT,ND,+BS Extr: no c/c/e Psych: full affect, pleasant   Assessment and Plan:  1. ASTHMA   2. ALLERGIC RHINITIS   3. Sinusitis, acute maxillary    One  month of significant allergy flare, now with some significant sinusitis.  Also having a mild asthma flare right now.  We'll treat her for sinusitis, bolster her allergic rhinitis regiment.  Orders Today: No orders of the defined types were placed in this encounter.    Medications Today: Meds ordered this encounter  Medications  . fluticasone (FLONASE) 50 MCG/ACT nasal spray    Sig: Place 2 sprays into the nose daily.    Dispense:  16 g    Refill:  12  . amoxicillin (AMOXIL) 500 MG capsule    Sig: Take 2 capsules (1,000 mg total) by mouth 2 (two) times daily.    Dispense:  40 capsule    Refill:  0

## 2011-04-28 ENCOUNTER — Ambulatory Visit (INDEPENDENT_AMBULATORY_CARE_PROVIDER_SITE_OTHER): Payer: 59 | Admitting: Family Medicine

## 2011-04-28 ENCOUNTER — Encounter: Payer: Self-pay | Admitting: Family Medicine

## 2011-04-28 VITALS — BP 120/70 | HR 106 | Temp 98.6°F | Ht 65.5 in | Wt 128.8 lb

## 2011-04-28 DIAGNOSIS — T781XXA Other adverse food reactions, not elsewhere classified, initial encounter: Secondary | ICD-10-CM

## 2011-04-28 DIAGNOSIS — Z91018 Allergy to other foods: Secondary | ICD-10-CM

## 2011-04-28 MED ORDER — MAGIC MOUTHWASH W/LIDOCAINE: 5.0000 mL | Freq: Four times a day (QID) | ORAL | Status: DC | PRN

## 2011-04-28 MED ORDER — PREDNISONE 20 MG PO TABS: ORAL_TABLET | ORAL | Status: DC

## 2011-04-28 NOTE — Progress Notes (Signed)
  Patient Name: Gloria Rush Date of Birth: [birth year] Age: 36 y.o. Medical Record Number: [id number] Gender: female Date of Encounter: 04/28/2011  History of Present Illness:  Gloria Rush is a 36 y.o. very pleasant female patient who presents with the following:  Presents with "ulcers in her throat" and pain with swallowing. Known citrus allergy, drank a lot of tea mixed with lemon a couple of days ago, now throat pain, has sensation of ulcers, no trouble breathing  Past Medical History, Surgical History, Social History, Family History, Problem List, Medications, and Allergies have been reviewed and updated if relevant.  Review of Systems:  GEN: no fevers, chills. GI: No n/v/d Pulm: No SOB Interactive and getting along well at home.  Otherwise, ROS is as per the HPI.   Physical Examination: Filed Vitals:   04/28/11 1622  BP: 120/70  Pulse: 106  Temp: 98.6 F (37 C)  TempSrc: Oral  Height: 5' 5.5" (1.664 m)  Weight: 128 lb 12.8 oz (58.423 kg)  SpO2: 98%    Body mass index is 21.11 kg/(m^2).   GEN: WDWN, NAD, Non-toxic, A & O x 3 HEENT: Atraumatic, Normocephalic. Neck supple. No masses, No LAD. No ulcers seen Ears and Nose: No external deformity. CV: RRR, No M/G/R. No JVD. No thrill. No extra heart sounds. PULM: CTA B, no wheezes, crackles, rhonchi. No retractions. No resp. distress. No accessory muscle use. EXTR: No c/c/e NEURO Normal gait.  PSYCH: Normally interactive. Conversant. Not depressed or anxious appearing.  Calm demeanor.    Assessment and Plan: 1. Allergy, food     pred x 6 d Reassured Magic mw

## 2011-06-03 ENCOUNTER — Telehealth: Payer: Self-pay

## 2011-06-03 MED ORDER — ESCITALOPRAM OXALATE 20 MG PO TABS: 10.0000 mg | ORAL_TABLET | Freq: Every day | ORAL | Status: DC

## 2011-06-03 NOTE — Telephone Encounter (Signed)
Pt left v/m with phone # no message. Called pt left v/m to call back.

## 2011-06-03 NOTE — Telephone Encounter (Signed)
Spoke with patient and she just needed refills

## 2011-08-13 ENCOUNTER — Telehealth: Payer: Self-pay | Admitting: Family Medicine

## 2011-08-13 MED ORDER — ESOMEPRAZOLE MAGNESIUM 40 MG PO CPDR: DELAYED_RELEASE_CAPSULE | ORAL | Status: DC

## 2011-08-13 NOTE — Telephone Encounter (Signed)
Needs Nexium refilled for the weekend.

## 2011-09-02 ENCOUNTER — Ambulatory Visit (INDEPENDENT_AMBULATORY_CARE_PROVIDER_SITE_OTHER): Payer: BC Managed Care – PPO | Admitting: Family Medicine

## 2011-09-02 ENCOUNTER — Encounter: Payer: Self-pay | Admitting: Family Medicine

## 2011-09-02 VITALS — BP 98/70 | HR 80 | Temp 98.0°F | Wt 134.0 lb

## 2011-09-02 DIAGNOSIS — J309 Allergic rhinitis, unspecified: Secondary | ICD-10-CM

## 2011-09-02 DIAGNOSIS — J45909 Unspecified asthma, uncomplicated: Secondary | ICD-10-CM

## 2011-09-02 DIAGNOSIS — J0100 Acute maxillary sinusitis, unspecified: Secondary | ICD-10-CM

## 2011-09-02 MED ORDER — MONTELUKAST SODIUM 10 MG PO TABS: 10.0000 mg | ORAL_TABLET | Freq: Every day | ORAL | Status: DC

## 2011-09-02 MED ORDER — AMOXICILLIN-POT CLAVULANATE 875-125 MG PO TABS: 1.0000 | ORAL_TABLET | Freq: Two times a day (BID) | ORAL | Status: AC

## 2011-09-02 MED ORDER — FLUCONAZOLE 150 MG PO TABS: 150.0000 mg | ORAL_TABLET | Freq: Once | ORAL | Status: AC

## 2011-09-02 NOTE — Progress Notes (Signed)
[facility] at [facility] [address] [place] [address] Phone: [phone] Fax: [phone]  Date:  09/02/2011   Name:  Gloria Rush   DOB:  [birth year]   MRN:  [id number] Gender: female  Age: 36 y.o.  PCP:  Hannah Beat, MD    Chief Complaint: Sinus congestion, drainage, dizzy, some cough   History of Present Illness:  Gloria Rush is a 36 y.o. pleasant patient who presents with the following:  Allegra now, but not better - benadryl great.  (trial of generic zyrtec)  The patient has been having 2 months a very significant nasal congestion, stuffiness, frontal and maxillary facial headaches. She has some nasal congestion. She has not had any significant wheezing. She is not febrile. She is having some postnasal drip. The worst part is the headaches and pain behind her eyes currently.  Her wheezing is under control, she is doing her Asmanex. She rarely if ever uses her albuterol.  She did have a good response in the past 2 Singulair. She was worried at one point that this was causing some anxiety, so we stopped it.  She is also continuing to use her Flonase.   Patient Active Problem List  Diagnosis  . DEPRESSIVE DISORDER  . ALLERGIC CONJUNCTIVITIS  . ALLERGIC RHINITIS  . ASTHMA  . GERD  . DERMATITIS, ATOPIC  . FATIGUE  . ANXIETY    Past Medical History  Diagnosis Date  . Allergy   . Asthma   . Anxiety     No past surgical history on file.  History  Substance Use Topics  . Smoking status: Never Smoker   . Smokeless tobacco: Not on file  . Alcohol Use: No    Family History  Problem Relation Age of Onset  . Anxiety disorder Mother   . Anxiety disorder Brother   . Diabetes Maternal Grandfather   . Heart disease Paternal Grandmother     heart attack   Allergies updated.  Medication list has been reviewed and updated.  Current Outpatient Prescriptions on File Prior to Visit  Medication Sig Dispense Refill  . albuterol  (PROVENTIL) (2.5 MG/3ML) 0.083% nebulizer solution Take 2.5 mg by nebulization every 6 (six) hours as needed.        . ASMANEX 120 METERED DOSES 220 MCG/INH inhaler INHALE 2 PUFFS DAILY AS DIRECTED  1 Inhaler  2  . escitalopram (LEXAPRO) 20 MG tablet Take 0.5 tablets (10 mg total) by mouth daily.  30 tablet  2  . esomeprazole (NEXIUM) 40 MG capsule Take one by mouth daily  30 capsule  6  . fluticasone (FLONASE) 50 MCG/ACT nasal spray Place 2 sprays into the nose daily.  16 g  12  . VENTOLIN HFA 108 (90 BASE) MCG/ACT inhaler INHALE TWO PUFFS BY MOUTH EVERY FOUR HOURS AS NEEDED FOR WHEEZING  18 g  4    Review of Systems:  ROS: GEN: Acute illness details above GI: Tolerating PO intake GU: maintaining adequate hydration and urination Pulm: No SOB Interactive and getting along well at home.  Otherwise, ROS is as per the HPI.   Physical Examination: Filed Vitals:   09/02/11 1609  BP: 98/70  Pulse: 80  Temp: 98 F (36.7 C)   Filed Vitals:   09/02/11 1609  Weight: 134 lb (60.782 kg)   There is no height on file to calculate BMI. Ideal Body Weight:     Gen: WDWN, NAD; alert,appropriate and cooperative throughout exam  HEENT: Normocephalic  and atraumatic. Throat clear, w/o exudate, no LAD, TM's with small amount of fluid. rhinnorhea.  R sinus with a polyp Left frontal and maxillary sinuses: Tender Right frontal and maxillary sinuses: Tender  Neck: No ant or post LAD CV: RRR, No M/G/R Pulm: Breathing comfortably in no resp distress. no w/c/r Abd: S,NT,ND,+BS Extr: no c/c/e Psych: full affect, pleasant   Assessment and Plan:  1. Sinusitis, acute maxillary   2. ALLERGIC RHINITIS   3. ASTHMA    Significant sinusitis. Treat with Augmentin.  Restart Singulair for allergy control and asthma control. Trial of zyrtec  Orders Today:  No orders of the defined types were placed in this encounter.    Medications Today: (Includes new updates added during medication  reconciliation) Meds ordered this encounter  Medications  . montelukast (SINGULAIR) 10 MG tablet    Sig: Take 1 tablet (10 mg total) by mouth at bedtime.    Dispense:  30 tablet    Refill:  11  . amoxicillin-clavulanate (AUGMENTIN) 875-125 MG per tablet    Sig: Take 1 tablet by mouth 2 (two) times daily.    Dispense:  20 tablet    Refill:  0  . fluconazole (DIFLUCAN) 150 MG tablet    Sig: Take 1 tablet (150 mg total) by mouth once.    Dispense:  1 tablet    Refill:  0    Medications Discontinued: Medications Discontinued During This Encounter  Medication Reason  . Alum & Mag Hydroxide-Simeth (MAGIC MOUTHWASH W/LIDOCAINE) SOLN Error  . Norgestim-Eth Estrad Triphasic (ORTHO TRI-CYCLEN, 28,) 0.18/0.215/0.25 MG-35 MCG TABS Error  . predniSONE (DELTASONE) 20 MG tablet Error     Hannah Beat, MD

## 2011-09-03 ENCOUNTER — Telehealth: Payer: Self-pay

## 2011-09-03 MED ORDER — AZITHROMYCIN 250 MG PO TABS: ORAL_TABLET | ORAL | Status: AC

## 2011-09-03 NOTE — Telephone Encounter (Signed)
LMOM, asked to call back with a new pharmacy.  When done, please send in a zpak  Zpak 2 tabs po on day 1, then 1 tab po for 4 days  [name], I am sending to you, since Herbert Seta is gone

## 2011-09-03 NOTE — Telephone Encounter (Signed)
Medicine sent to [facility] [address] [place], per pt's request. Advised patient.

## 2011-09-03 NOTE — Telephone Encounter (Signed)
Pt seen 09/02/11; started Augmentin; has taken Augmentin twice; both times 15- 20 mins after taking med felt tightness in chest that lasted 1 -2 hours. Pt said she was moving air in chest. Also itchy on hands and wrist with slight rash ( 1-2 spots on wrist).  Pt will be going out of town in 30 minutes for 1 week due to sister in law's death. Pt wants to know if OK not to take Augmentin for the next week while out of town or should different med be sent to [facility]. Pt request immediate call back to [phone].Please advise.

## 2011-10-25 ENCOUNTER — Telehealth: Payer: Self-pay | Admitting: *Deleted

## 2011-10-25 NOTE — Telephone Encounter (Signed)
.  left message to have patient return my call, to discuss prior auth for Nexium.

## 2011-10-28 NOTE — Telephone Encounter (Signed)
Spoke with patient and she would like to proceed with prior auth for nexium, pt states it prior Berkley Harvey is denied she will try whatever Dr.Copland thinks is best, she just doesn't want any side effects of anxiety.  PA form on your desk to be filled out.

## 2011-10-29 NOTE — Telephone Encounter (Signed)
Will complete, can you pull chart and put prior meds on prior auth please

## 2011-11-01 NOTE — Telephone Encounter (Signed)
Completed PA faxed to [phone]. Form held at [employer] until approval letter arrives.

## 2011-11-04 NOTE — Telephone Encounter (Signed)
Per Dr. Patsy Lager pt was denied Nexium and insurance requires pt to try Dexilant or Aciphex, Dr.Copland suggests Dexilant, medication added in chart but I just need to know which pharmacy pt will use.   ( see denial scanned in chart)

## 2011-11-05 MED ORDER — DEXLANSOPRAZOLE 30 MG PO CPDR: 30.0000 mg | DELAYED_RELEASE_CAPSULE | Freq: Every day | ORAL | Status: DC

## 2011-11-05 NOTE — Telephone Encounter (Signed)
Spoke with pt advised her of prior auth denial, and that a new medication had been prescribed. Pt states she used [facility], I sent med electronically.

## 2011-12-17 ENCOUNTER — Other Ambulatory Visit: Payer: Self-pay | Admitting: Family Medicine

## 2012-01-03 ENCOUNTER — Telehealth: Payer: Self-pay

## 2012-01-03 MED ORDER — ALBUTEROL SULFATE HFA 108 (90 BASE) MCG/ACT IN AERS: 2.0000 | INHALATION_SPRAY | RESPIRATORY_TRACT | Status: DC | PRN

## 2012-01-03 NOTE — Telephone Encounter (Signed)
Patient advised and samples up front and also rx for albuterol sent to pharmacy

## 2012-01-03 NOTE — Telephone Encounter (Signed)
Pt said pharmacy told pt will need Prior auth for Dexlansoprazole. Pt has enough med to last until Thurs. Pt request samples of Nexium until PA completed. (Pharmacy to fax PA request). Pt said some tightness with breathing; just had chiropractic adjustment and peak flow is 450 and after pt takes Benadryl goes to sleep and wakes up breathing is fine; only has issue when goes outside. Pt will call back for appt or go to UC if breathing worsens.Please advise about samples.Marland Kitchen

## 2012-01-03 NOTE — Telephone Encounter (Signed)
Can we give samples for Nexium, 30 day supply - there was a huge box of samples. I will fill out PA  Keep watch on asthma, atmosphere bothering a lot of people now. She can use her rescue albuterol if she needs it.  If she needs a refill, then please refill albuterol HFA, 2 puffs q 4 hours prn wheezing. #1, 3 refills

## 2012-01-06 ENCOUNTER — Telehealth: Payer: Self-pay

## 2012-01-06 NOTE — Telephone Encounter (Signed)
[facility] faxed prior auth for Dexilant. [phone] spoke with Chastity; approved over phone for 24 months. Approval letter to follow.

## 2012-01-06 NOTE — Telephone Encounter (Signed)
[place] at [facility] notified and rx went thru.

## 2012-01-17 ENCOUNTER — Other Ambulatory Visit: Payer: Self-pay | Admitting: *Deleted

## 2012-01-17 ENCOUNTER — Encounter: Payer: Self-pay | Admitting: Family Medicine

## 2012-01-17 ENCOUNTER — Ambulatory Visit (INDEPENDENT_AMBULATORY_CARE_PROVIDER_SITE_OTHER): Payer: BC Managed Care – PPO | Admitting: Family Medicine

## 2012-01-17 VITALS — BP 112/82 | HR 85 | Temp 97.9°F | Wt 128.8 lb

## 2012-01-17 DIAGNOSIS — J45909 Unspecified asthma, uncomplicated: Secondary | ICD-10-CM | POA: Insufficient documentation

## 2012-01-17 MED ORDER — AZITHROMYCIN 250 MG PO TABS: ORAL_TABLET | ORAL | Status: DC

## 2012-01-17 MED ORDER — PREDNISONE 10 MG PO TABS: ORAL_TABLET | ORAL | Status: DC

## 2012-01-17 NOTE — Progress Notes (Signed)
  Subjective:    Patient ID: Gloria Rush, female    DOB: [birth year], 37 y.o.   MRN: [id number]  HPI CC: nasal congestion and cough  Pleasant 36yo with h/o asthma, allergies and anxiety presents with 1+ wk h/o nasal congestion, chest tightness, and coughing that started today.  + ear pain, PNDrainage.  Frontal pressure headache.  + RN and sneezing.  + wheezing.  Waking up coughing and using albuterol at night.  Chest congestion > head congestion.  Has been using albuterol 4x/day.  Much more than her normal.  So far has tried regular allergy/asthma meds.  Asthma triggers are cold, smells, laughing.  No fevers/chills, abd pain, tooth pain.  Singulair made her feel bad. No sick contacts at home. No smokers at home.  Normal PF = 450L/s Recent PF = today's 480L/s  Past Medical History  Diagnosis Date  . Allergy   . Asthma   . Anxiety     Review of Systems Per HPI    Objective:   Physical Exam  Nursing note and vitals reviewed. Constitutional: She appears well-developed and well-nourished. No distress.       Tired appearing, nontoxic  HENT:  Head: Normocephalic and atraumatic.  Right Ear: Hearing, tympanic membrane, external ear and ear canal normal.  Left Ear: Hearing, tympanic membrane, external ear and ear canal normal.  Nose: No mucosal edema or rhinorrhea. Right sinus exhibits maxillary sinus tenderness. Right sinus exhibits no frontal sinus tenderness. Left sinus exhibits maxillary sinus tenderness. Left sinus exhibits no frontal sinus tenderness.  Mouth/Throat: Uvula is midline, oropharynx is clear and moist and mucous membranes are normal. No oropharyngeal exudate, posterior oropharyngeal edema, posterior oropharyngeal erythema or tonsillar abscesses.       Mild maxillary sinusitis  Eyes: Conjunctivae normal and EOM are normal. Pupils are equal, round, and reactive to light. No scleral icterus.  Neck: Normal range of motion. Neck supple.  Cardiovascular: Normal rate,  regular rhythm, normal heart sounds and intact distal pulses.   No murmur heard. Pulmonary/Chest: Effort normal. No respiratory distress. She has wheezes. She has no rales.       Wheezes only evident with forced expiration  Lymphadenopathy:    She has no cervical adenopathy.  Skin: Skin is warm and dry. No rash noted.       Assessment & Plan:

## 2012-01-17 NOTE — Patient Instructions (Signed)
I do think you have bronchitis/sinusitis which has flared up your asthma. Increase asmanex to 2 puffs twice daily for the next week. Continue albuterol as needed Fill zpack. If wheezing continues despite increased asmanex, fill prednisone course. Let us know if fever >101, or worsening shortness of breath, wheeze, or cough.

## 2012-01-17 NOTE — Assessment & Plan Note (Signed)
Anticipate URTI that has led to mild asthma flare, however preserved peak flow and wheezing only evident with forced expiration (recently did use albuterol) Will treat with zpack to cover bacterial bronchitis, and increase asmanex dose for next 1 week, continue scheduled albuterol. If wheezing/SOB continued, advised fill prednisone taper. Pt agrees with plan.

## 2012-02-26 ENCOUNTER — Other Ambulatory Visit: Payer: Self-pay

## 2012-03-11 ENCOUNTER — Other Ambulatory Visit: Payer: Self-pay | Admitting: Family Medicine

## 2012-03-13 NOTE — Telephone Encounter (Signed)
[facility] request refill Nystatin suspension. Do not see on med list.Please advise.

## 2012-03-24 ENCOUNTER — Telehealth: Payer: Self-pay | Admitting: Family Medicine

## 2012-03-24 NOTE — Telephone Encounter (Signed)
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  6.  Wants to know if she should get a prescription for Vit D or if an OTC supplement would be ok.  PLEASE CALL PT BACK AT [phone] TO LET HER KNOW WHAT SHE SHOULD DO.  SAID IF MD WANTS TO CALL IN SCRIPT, [facility]ICH IS IN HER CHART.  Thanks.

## 2012-03-24 NOTE — Telephone Encounter (Signed)
Vitamin D level is very low - will need high dose at least at beginning. Please call  In for her Vitamin D, 50,000 units. 1 po q week. #4, 3 refills.  Recheck Vit D level 3 months: Vit D deficiency F/u with me 1 week after blood draw

## 2012-03-27 MED ORDER — ERGOCALCIFEROL 1.25 MG (50000 UT) PO CAPS: 50000.0000 [IU] | ORAL_CAPSULE | ORAL | Status: DC

## 2012-03-27 NOTE — Telephone Encounter (Signed)
Patient advised and will call back for appt

## 2012-04-11 ENCOUNTER — Encounter: Payer: Self-pay | Admitting: Family Medicine

## 2012-04-11 ENCOUNTER — Telehealth: Payer: Self-pay | Admitting: Family Medicine

## 2012-04-11 ENCOUNTER — Ambulatory Visit (INDEPENDENT_AMBULATORY_CARE_PROVIDER_SITE_OTHER): Payer: BC Managed Care – PPO | Admitting: Family Medicine

## 2012-04-11 VITALS — BP 100/64 | HR 89 | Temp 98.1°F | Ht 65.5 in | Wt 127.5 lb

## 2012-04-11 DIAGNOSIS — B349 Viral infection, unspecified: Secondary | ICD-10-CM | POA: Insufficient documentation

## 2012-04-11 DIAGNOSIS — J020 Streptococcal pharyngitis: Secondary | ICD-10-CM

## 2012-04-11 DIAGNOSIS — B9789 Other viral agents as the cause of diseases classified elsewhere: Secondary | ICD-10-CM

## 2012-04-11 DIAGNOSIS — J029 Acute pharyngitis, unspecified: Secondary | ICD-10-CM

## 2012-04-11 MED ORDER — AMOXICILLIN 500 MG PO CAPS: 1000.0000 mg | ORAL_CAPSULE | Freq: Two times a day (BID) | ORAL | Status: DC

## 2012-04-11 NOTE — Telephone Encounter (Signed)
Notify pt that after she left her rapid strep became positive. To be careful we wil treat with amox x 10 days.

## 2012-04-11 NOTE — Telephone Encounter (Signed)
Patient advised.

## 2012-04-11 NOTE — Patient Instructions (Signed)
Tylenol for sore throat and bodyache. Mucinex for congestion, add DM if cough develops.  Expect it to take 7-10 days to resolve. Call if shortness of breath, go to ER if severe.

## 2012-04-11 NOTE — Progress Notes (Signed)
  Subjective:    Patient ID: Gloria Rush, female    DOB: 07-May-1975, 37 y.o.   MRN: [id number]  Sore Throat  This is a new problem. The current episode started yesterday. The problem has been gradually worsening. Neither side of throat is experiencing more pain than the other. There has been no fever. The patient is experiencing no pain. Associated symptoms include congestion, swollen glands and trouble swallowing. Pertinent negatives include no abdominal pain or shortness of breath. Associated symptoms comments: itchy throat, post nasal drip, body aches  sinus pressure. Chills. She has had no exposure to strep or mono. Treatments tried: throat lozange, tylenol. The treatment provided mild relief.      Review of Systems  Constitutional: Positive for chills and fatigue.  HENT: Positive for congestion and trouble swallowing.   Respiratory: Negative for shortness of breath and wheezing.   Cardiovascular: Negative for chest pain, palpitations and leg swelling.  Gastrointestinal: Negative for abdominal pain.       Objective:   Physical Exam  Constitutional: Vital signs are normal. She appears well-developed and well-nourished. She is cooperative.  Non-toxic appearance. She does not appear ill. No distress.  HENT:  Head: Normocephalic.  Right Ear: Hearing, tympanic membrane, external ear and ear canal normal. Tympanic membrane is not erythematous, not retracted and not bulging.  Left Ear: Hearing, tympanic membrane, external ear and ear canal normal. Tympanic membrane is not erythematous, not retracted and not bulging.  Nose: Mucosal edema and rhinorrhea present. Right sinus exhibits no maxillary sinus tenderness and no frontal sinus tenderness. Left sinus exhibits no maxillary sinus tenderness and no frontal sinus tenderness.  Mouth/Throat: Uvula is midline, oropharynx is clear and moist and mucous membranes are normal.  Eyes: Conjunctivae, EOM and lids are normal. Pupils are equal, round,  and reactive to light. No foreign bodies found.  Neck: Trachea normal and normal range of motion. Neck supple. Carotid bruit is not present. No mass and no thyromegaly present.  Cardiovascular: Normal rate, regular rhythm, S1 normal, S2 normal, normal heart sounds, intact distal pulses and normal pulses.  Exam reveals no gallop and no friction rub.   No murmur heard. Pulmonary/Chest: Effort normal and breath sounds normal. Not tachypneic. No respiratory distress. She has no decreased breath sounds. She has no wheezes. She has no rhonchi. She has no rales.  Neurological: She is alert.  Skin: Skin is warm, dry and intact. No rash noted.  Psychiatric: Her speech is normal and behavior is normal. Judgment normal. Her mood appears not anxious. Cognition and memory are normal. She does not exhibit a depressed mood.          Assessment & Plan:

## 2012-04-11 NOTE — Assessment & Plan Note (Signed)
Symptomatic care 

## 2012-04-13 ENCOUNTER — Encounter: Payer: Self-pay | Admitting: Family Medicine

## 2012-04-13 NOTE — Telephone Encounter (Signed)
She usually gets at her nose. If that is case  Bactroban ointment, apply tid x 7 day or clear.  1 tube, 2 refills  If diffuse all over body, i will need to send in systemic abx  Hannah Beat, MD 04/13/2012, 4:47 PM

## 2012-04-14 ENCOUNTER — Encounter: Payer: Self-pay | Admitting: Family Medicine

## 2012-04-14 MED ORDER — MUPIROCIN 2 % EX OINT: TOPICAL_OINTMENT | Freq: Three times a day (TID) | CUTANEOUS | Status: DC

## 2012-04-25 ENCOUNTER — Other Ambulatory Visit: Payer: Self-pay | Admitting: Family Medicine

## 2012-05-01 ENCOUNTER — Encounter: Payer: Self-pay | Admitting: Family Medicine

## 2012-05-19 ENCOUNTER — Encounter: Payer: Self-pay | Admitting: Family Medicine

## 2012-06-06 ENCOUNTER — Encounter: Payer: Self-pay | Admitting: Family Medicine

## 2012-06-06 ENCOUNTER — Other Ambulatory Visit: Payer: Self-pay | Admitting: Family Medicine

## 2012-06-17 ENCOUNTER — Other Ambulatory Visit: Payer: Self-pay | Admitting: Family Medicine

## 2012-07-31 ENCOUNTER — Other Ambulatory Visit: Payer: Self-pay | Admitting: Family Medicine

## 2012-07-31 NOTE — Telephone Encounter (Signed)
Received refill request electronically. Medication is not on medication sheet. Is it okay to refill?

## 2012-08-20 ENCOUNTER — Other Ambulatory Visit: Payer: Self-pay | Admitting: Family Medicine

## 2012-08-21 NOTE — Telephone Encounter (Signed)
Ok to refill 

## 2012-08-22 ENCOUNTER — Other Ambulatory Visit: Payer: Self-pay | Admitting: *Deleted

## 2012-08-23 ENCOUNTER — Telehealth: Payer: Self-pay

## 2012-08-23 NOTE — Telephone Encounter (Signed)
Appointment made for patient tomorrow with Dr Ermalene Searing, no available appts today.

## 2012-08-23 NOTE — Telephone Encounter (Signed)
Add pt in to be seen today or tommorow.

## 2012-08-23 NOTE — Telephone Encounter (Signed)
Pt has been taking Dexilant for 2-3 months; last couple of weeks when takes Dexilant pts throat feels tight for 2-3 hours. No problem breathing. No lips,tongue or mouth swelling. Pt wants substitute med for Dexilant. Advised pt to stop Dexilant and pt will wait for cb. Pt has tried Nexium in past and it worked well but [employer] will not cover and too expensive OTC. Now throat does not feel tight.Please advise.[facility]. Pt drinks one iced tea a day and does that effect reflux.

## 2012-08-24 ENCOUNTER — Ambulatory Visit (INDEPENDENT_AMBULATORY_CARE_PROVIDER_SITE_OTHER): Payer: BC Managed Care – PPO | Admitting: Family Medicine

## 2012-08-24 ENCOUNTER — Encounter: Payer: Self-pay | Admitting: Family Medicine

## 2012-08-24 VITALS — BP 134/82 | HR 74 | Temp 97.9°F | Wt 123.5 lb

## 2012-08-24 DIAGNOSIS — R5381 Other malaise: Secondary | ICD-10-CM

## 2012-08-24 DIAGNOSIS — R5383 Other fatigue: Secondary | ICD-10-CM

## 2012-08-24 DIAGNOSIS — K219 Gastro-esophageal reflux disease without esophagitis: Secondary | ICD-10-CM

## 2012-08-24 DIAGNOSIS — B9789 Other viral agents as the cause of diseases classified elsewhere: Secondary | ICD-10-CM

## 2012-08-24 DIAGNOSIS — B349 Viral infection, unspecified: Secondary | ICD-10-CM

## 2012-08-24 DIAGNOSIS — R300 Dysuria: Secondary | ICD-10-CM

## 2012-08-24 DIAGNOSIS — E559 Vitamin D deficiency, unspecified: Secondary | ICD-10-CM | POA: Insufficient documentation

## 2012-08-24 LAB — POCT URINALYSIS DIPSTICK
Ketones, UA: NEGATIVE
Spec Grav, UA: 1.025
pH, UA: 6

## 2012-08-24 NOTE — Progress Notes (Signed)
  Subjective:    Patient ID: Gloria Rush, female    DOB: December 26, 1975, 37 y.o.   MRN: [id number]  HPI 37 year old female with history of GERD presents with   She originally started due to asthma being triggered by reflux.  nexium not covered by insurance.. Cannot afford. She had throat tightened with dexilant. She stopped this last night.. SE improved . Tums controlled any reflux flare. She is wondering if she can use OTC   In last  week.Marland Kitchen greenish nasal discharge. Sinus pain and pressure. Mild ear pressure, no pain.  No fever, No SOB, no wheeze. Using allegra for allergies, benadryl.  She has intermittent dysuria urination in last 2 months. Usually around menstrual. No change in frequency or urgency. Abdominal cramping at times.  No fever.  She request vit D re-eval after treatment.     Review of Systems  Constitutional: Negative for fever and fatigue.  HENT: Negative for ear pain.   Eyes: Negative for pain.  Respiratory: Negative for chest tightness and shortness of breath.   Cardiovascular: Negative for chest pain, palpitations and leg swelling.  Gastrointestinal: Negative for abdominal pain.  Genitourinary: Negative for dysuria.       Objective:   Physical Exam  Constitutional: Vital signs are normal. She appears well-developed and well-nourished. She is cooperative.  Non-toxic appearance. She does not appear ill. No distress.  HENT:  Head: Normocephalic.  Right Ear: Hearing, tympanic membrane, external ear and ear canal normal. Tympanic membrane is not erythematous, not retracted and not bulging.  Left Ear: Hearing, tympanic membrane, external ear and ear canal normal. Tympanic membrane is not erythematous, not retracted and not bulging.  Nose: Mucosal edema and rhinorrhea present. Right sinus exhibits maxillary sinus tenderness and frontal sinus tenderness. Left sinus exhibits maxillary sinus tenderness and frontal sinus tenderness.  Mouth/Throat: Uvula is midline,  oropharynx is clear and moist and mucous membranes are normal.  Eyes: Conjunctivae, EOM and lids are normal. Pupils are equal, round, and reactive to light. Lids are everted and swept, no foreign bodies found.  Neck: Trachea normal and normal range of motion. Neck supple. Carotid bruit is not present. No mass and no thyromegaly present.  Cardiovascular: Normal rate, regular rhythm, S1 normal, S2 normal, normal heart sounds, intact distal pulses and normal pulses.  Exam reveals no gallop and no friction rub.   No murmur heard. Pulmonary/Chest: Effort normal and breath sounds normal. Not tachypneic. No respiratory distress. She has no decreased breath sounds. She has no wheezes. She has no rhonchi. She has no rales.  Abdominal: Soft. Normal appearance and bowel sounds are normal. There is no tenderness.  Neurological: She is alert.  Skin: Skin is warm, dry and intact. No rash noted.  Psychiatric: Her speech is normal and behavior is normal. Judgment and thought content normal. Her mood appears not anxious. Cognition and memory are normal. She does not exhibit a depressed mood.          Assessment & Plan:

## 2012-08-24 NOTE — Patient Instructions (Addendum)
Nasal saline irrigation 2 times a day. Can try decongestant  as you do not tolerate mucinex.  Call if not improving in 4-5 days. Try over the counter nexium at 20 mg daily for control of GERD symptoms.  No sign of urine infection at this time.

## 2012-08-25 LAB — VITAMIN D 25 HYDROXY (VIT D DEFICIENCY, FRACTURES): Vit D, 25-Hydroxy: 42 ng/mL (ref 30–89)

## 2012-09-04 DIAGNOSIS — R300 Dysuria: Secondary | ICD-10-CM | POA: Insufficient documentation

## 2012-09-04 NOTE — Assessment & Plan Note (Signed)
Nasal saline irrigation 2 times a day. Can try decongestant  as you do not tolerate mucinex.  Call if not improving in 4-5 days.

## 2012-09-04 NOTE — Assessment & Plan Note (Signed)
Re-eval. 

## 2012-09-04 NOTE — Assessment & Plan Note (Signed)
No ongoing Urinary infection. Likely urinary irritation... Increase fluids, avoid triggers (reviewed foods to avoid)

## 2012-09-04 NOTE — Assessment & Plan Note (Signed)
Surprisingly improved off dexilant. [place] for pt to return to OTC PPI likely nexium which worked better for her in past.

## 2012-09-13 ENCOUNTER — Encounter: Payer: Self-pay | Admitting: Family Medicine

## 2012-09-13 NOTE — Telephone Encounter (Signed)
Can you call her and help me figure out what she has taken in the past.  I know she was on prilosec, nexium, and dexilant, but she has severe reflux.  Alternatively, I am happy to see her in the office, too.   Hannah Beat, MD 09/13/2012, 4:57 PM

## 2012-09-14 NOTE — Telephone Encounter (Signed)
  Hannah Beat, MD 09/14/2012, 11:41 AM

## 2012-09-20 ENCOUNTER — Encounter (INDEPENDENT_AMBULATORY_CARE_PROVIDER_SITE_OTHER): Payer: Self-pay | Admitting: Family Medicine

## 2012-09-20 ENCOUNTER — Ambulatory Visit (INDEPENDENT_AMBULATORY_CARE_PROVIDER_SITE_OTHER): Payer: BC Managed Care – PPO | Admitting: Family Medicine

## 2012-09-20 ENCOUNTER — Encounter: Payer: Self-pay | Admitting: Family Medicine

## 2012-09-20 DIAGNOSIS — K219 Gastro-esophageal reflux disease without esophagitis: Secondary | ICD-10-CM

## 2012-09-20 DIAGNOSIS — J0100 Acute maxillary sinusitis, unspecified: Secondary | ICD-10-CM

## 2012-09-20 DIAGNOSIS — H6590 Unspecified nonsuppurative otitis media, unspecified ear: Secondary | ICD-10-CM

## 2012-09-20 DIAGNOSIS — H6592 Unspecified nonsuppurative otitis media, left ear: Secondary | ICD-10-CM

## 2012-09-20 MED ORDER — PANTOPRAZOLE SODIUM 40 MG PO TBEC: 40.0000 mg | DELAYED_RELEASE_TABLET | Freq: Every day | ORAL | Status: DC

## 2012-09-20 MED ORDER — AMOXICILLIN 500 MG PO CAPS: 1000.0000 mg | ORAL_CAPSULE | Freq: Two times a day (BID) | ORAL | Status: DC

## 2012-09-20 NOTE — Progress Notes (Signed)
[facility] at [facility] [address] [place] [address] Phone: [phone] Fax: [phone]  Date:  09/20/2012   Name:  Gloria Rush   DOB:  February 12, 1975   MRN:  [id number] Gender: female Age: 37 y.o.  Primary Physician:  Hannah Beat, MD  Evaluating MD: Hannah Beat, MD  Chief Complaint: No chief complaint on file.   History of Present Illness:  Gloria Rush is a 37 y.o. very pleasant female patient who presents with the following:  SICK: she has significant allergies, which have been flared up for the last few weeks. Sneezing, runny nose, > 100 at home. Peak flow is fine, feels tight a little bit. Went off soda.  Now she feels pretty terrible, pain behind eyes, max region. Tylenol cold helped a little bit.  GERD: nexium, insurance will not take. Dexilant. (Not protonix, aciphex)  Flared up a lot recently.   Past Medical History, Surgical History, Social History, Family History, Problem List, Medications, and Allergies have been reviewed and updated if relevant.  Current Outpatient Prescriptions on File Prior to Visit  Medication Sig Dispense Refill  . albuterol (PROVENTIL) (2.5 MG/3ML) 0.083% nebulizer solution Take 2.5 mg by nebulization every 6 (six) hours as needed.        Marland Kitchen albuterol (VENTOLIN HFA) 108 (90 BASE) MCG/ACT inhaler Inhale 2 puffs into the lungs every 4 (four) hours as needed for wheezing.  18 g  4  . ASMANEX 60 METERED DOSES 220 MCG/INH inhaler INHALE 2 PUFFS DAILY AS DIRECTED  1 Inhaler  3  . diphenhydrAMINE (BENADRYL) 25 mg capsule Take 25 mg by mouth every 6 (six) hours as needed.      Marland Kitchen escitalopram (LEXAPRO) 20 MG tablet TAKE 1/2 TABLET BY MOUTH DAILY.  30 tablet  2  . mupirocin ointment (BACTROBAN) 2 % Apply topically 3 (three) times daily.  22 g  2   No current facility-administered medications on file prior to visit.    Review of Systems: ROS: GEN: Acute illness details above GI: Tolerating PO intake GU: maintaining  adequate hydration and urination Pulm: No SOB Interactive and getting along well at home.  Otherwise, ROS is as per the HPI.   Physical Examination: LMP 08/05/2012 Temp: 98.9 Resp: 18 Pulse: 80 BP: 120/75   Gen: WDWN, NAD; A & O x3, cooperative. Pleasant.Globally Non-toxic HEENT: Normocephalic and atraumatic. Throat clear, w/o exudate, R full of fluid, L TM - bulging, red, indistinct landmarks. rhinnorhea.  MMM Frontal sinuses: NT Max sinuses: tender R > L NECK: Anterior cervical  LAD is absent CV: RRR, No M/G/R, cap refill <2 sec PULM: Breathing comfortably in no respiratory distress. no wheezing, crackles, rhonchi EXT: No c/c/e PSYCH: Friendly, good eye contact MSK: Nml gait    Assessment and Plan:  Left otitis media with effusion  Sinusitis, acute maxillary  GERD  L OM, possibly early max sinus - covered with amox for om Trial of protonix  Orders Today:  No orders of the defined types were placed in this encounter.    Updated Medication List: (Includes new medications, updates to list, dose adjustments) Meds ordered this encounter  Medications  . amoxicillin (AMOXIL) 500 MG capsule    Sig: Take 2 capsules (1,000 mg total) by mouth 2 (two) times daily.    Dispense:  40 capsule    Refill:  0  . pantoprazole (PROTONIX) 40 MG tablet    Sig: Take 1 tablet (40 mg total) by mouth daily.  Dispense:  30 tablet    Refill:  12    Medications Discontinued: There are no discontinued medications.    Signed, Elpidio Galea. Jeremiah Tarpley, MD 09/20/2012 11:32 AM

## 2012-09-21 ENCOUNTER — Encounter: Payer: Self-pay | Admitting: Family Medicine

## 2012-09-21 NOTE — Progress Notes (Signed)
  Subjective:    Patient ID: Gloria Rush, female    DOB: Jul 21, 1975, 37 y.o.   MRN: [id number]  HPI  Cancelled appt.  Saw her PCP.  No charge.  Review of Systems     Objective:   Physical Exam        Assessment & Plan:

## 2012-09-22 ENCOUNTER — Encounter: Payer: Self-pay | Admitting: Family Medicine

## 2012-09-22 MED ORDER — FLUCONAZOLE 150 MG PO TABS: 150.0000 mg | ORAL_TABLET | Freq: Once | ORAL | Status: DC

## 2012-11-10 ENCOUNTER — Encounter: Payer: Self-pay | Admitting: Family Medicine

## 2012-11-10 ENCOUNTER — Ambulatory Visit (INDEPENDENT_AMBULATORY_CARE_PROVIDER_SITE_OTHER): Payer: BC Managed Care – PPO | Admitting: Family Medicine

## 2012-11-10 VITALS — BP 100/60 | HR 77 | Temp 98.2°F | Ht 65.5 in | Wt 124.2 lb

## 2012-11-10 DIAGNOSIS — J309 Allergic rhinitis, unspecified: Secondary | ICD-10-CM

## 2012-11-10 DIAGNOSIS — J45909 Unspecified asthma, uncomplicated: Secondary | ICD-10-CM

## 2012-11-10 DIAGNOSIS — K219 Gastro-esophageal reflux disease without esophagitis: Secondary | ICD-10-CM

## 2012-11-10 MED ORDER — TRIAMCINOLONE ACETONIDE 55 MCG/ACT NA AERO: 2.0000 | INHALATION_SPRAY | Freq: Every day | NASAL | Status: DC

## 2012-11-10 NOTE — Assessment & Plan Note (Signed)
Inadequate control.. Stay back on asthmanex. May be partially due to poor controlled GERD.

## 2012-11-10 NOTE — Progress Notes (Signed)
  Subjective:    Patient ID: Gloria Rush, female    DOB: March 20, 1975, 37 y.o.   MRN: [id number]  HPI  37 year old female presents with nasal congestion. She had sinus infection 1 month ago, treated with amox x 10 days.  Improved symptoms but still persistent pressure in left max sinus.  Using nasal saline No ear pain. No fever.  She has had some SOB at night ongoing worse  in last week. Using albuterol 1-2 times a day, helps! She is on asthmanex for control... She recently started back on this in last 2 weeks. Noted some improvement in SOB.  Peak flows 450-500.  Usually this time of year she struggles with allergies, terrible post nasal drip. Singulair made her mood.   GERD worse on PPI like pantoprazole... acutally improved off pantoprazole. Has never use H2 blocker.  Review of Systems  Constitutional: Negative for fever and fatigue.  HENT: Negative for ear pain.   Eyes: Negative for pain.  Respiratory: Negative for chest tightness and shortness of breath.   Cardiovascular: Negative for chest pain, palpitations and leg swelling.  Gastrointestinal: Negative for abdominal pain.  Genitourinary: Negative for dysuria.       Objective:   Physical Exam  Constitutional: Vital signs are normal. She appears well-developed and well-nourished. She is cooperative.  Non-toxic appearance. She does not appear ill. No distress.  HENT:  Head: Normocephalic.  Right Ear: Hearing, external ear and ear canal normal. Tympanic membrane is not erythematous, not retracted and not bulging. A middle ear effusion is present.  Left Ear: Hearing, external ear and ear canal normal. Tympanic membrane is not erythematous, not retracted and not bulging. A middle ear effusion is present.  Nose: Mucosal edema and rhinorrhea present. Right sinus exhibits no maxillary sinus tenderness and no frontal sinus tenderness. Left sinus exhibits no maxillary sinus tenderness and no frontal sinus tenderness.  Mouth/Throat:  Uvula is midline, oropharynx is clear and moist and mucous membranes are normal. No oropharyngeal exudate, posterior oropharyngeal edema, posterior oropharyngeal erythema or tonsillar abscesses.  Pale turbinate on left, more swollen than right.  Eyes: Conjunctivae, EOM and lids are normal. Pupils are equal, round, and reactive to light. Lids are everted and swept, no foreign bodies found.  Neck: Trachea normal and normal range of motion. Neck supple. Carotid bruit is not present. No mass and no thyromegaly present.  Cardiovascular: Normal rate, regular rhythm, S1 normal, S2 normal, normal heart sounds, intact distal pulses and normal pulses.  Exam reveals no gallop and no friction rub.   No murmur heard. Pulmonary/Chest: Effort normal and breath sounds normal. Not tachypneic. No respiratory distress. She has no decreased breath sounds. She has no wheezes. She has no rhonchi. She has no rales.  Abdominal: Soft. Bowel sounds are normal. There is no tenderness.  Neurological: She is alert.  Skin: Skin is warm, dry and intact. No rash noted.  Psychiatric: Her speech is normal and behavior is normal. Judgment normal. Her mood appears not anxious. Cognition and memory are normal. She does not exhibit a depressed mood.          Assessment & Plan:

## 2012-11-10 NOTE — Assessment & Plan Note (Signed)
Intolerant of PPI. Trial of H2 blocker BID.

## 2012-11-10 NOTE — Patient Instructions (Addendum)
Start pepcid AC twice daily for GERD given you cannot tolerate protonix. Start nasal steroid spray 2 sprays daily, use nasal saline irrigation as needed. Mucinex for nasal congestion. Call if not improving as expected.

## 2012-11-10 NOTE — Assessment & Plan Note (Signed)
No sign of bacterial infection. Likely allergic etiology... Start nasal steroid spray.

## 2012-11-13 ENCOUNTER — Telehealth: Payer: Self-pay | Admitting: Family Medicine

## 2012-11-13 NOTE — Telephone Encounter (Signed)
[facility] Triage Call Report Triage Record Num: [id number] Operator: Karenann Cai Patient Name: Gloria Rush Call Date & Time: 11/12/2012 1:04:46PM Patient Phone: [phone] PCP: Hannah Beat Patient Gender: Female PCP Fax : [phone] Patient DOB: 07-Apr-1975 [facility]: [facility] Reason for Call: Caller: Leatta/Patient; PCP: Kerby Nora (Family Practice); CB#: [phone]; reason for call: chest congestion: evaluated on 11/10/2012. Patient was not started on any medications. Patient reports the following symptoms: cough, achy in chest, no sputum at this time. Afebrile. LMP: now. Patient is drinking fluids. New symptom: wheezing. Disposition: see Provider within 24 hours due to answering yes to breathing problems occur with activity or exercise that has not caused symptoms before and are relieved by stopping the activity or exercise. RN advised patient that she can go to UC today (11/12/2012) for evaluation or call PCP in the morning (11/3) for an appointment. RN reviewed breathing problems care advice with patient. Patient plans to call PCP in the morning (11/3). Protocol(s) Used: Breathing Problems Protocol(s) Used: Upper Respiratory Infection (URI) Recommended Outcome per Protocol: See Provider within 24 hours Reason for Outcome: Breathing symptoms (wheezing, shortness of breath, changes in rates of breathing, skin color changes) main problem Breathing problems occur with activity or exercise that has not caused symptoms before AND are relieved by stopping the activity or exercise Care Advice: If patient has had similar symptoms in past, relieved by provider's recommendations, follow those recommendations now. ~ ~ Avoid any activity that produces symptoms until evaluated by provider. 11/12/2012 1:26:23PM Page 1 of 1 [email or web address]

## 2012-11-13 NOTE — Telephone Encounter (Signed)
i can see her today if she wants me to??

## 2012-11-13 NOTE — Telephone Encounter (Signed)
Left message for Arleen to return my call

## 2012-11-14 MED ORDER — AZITHROMYCIN 250 MG PO TABS: ORAL_TABLET | ORAL | Status: DC

## 2012-11-14 NOTE — Telephone Encounter (Signed)
Will send in prescription for antibiotics.. Pt to call if not improving with this course.  will treat for acute bronchitits

## 2012-11-14 NOTE — Telephone Encounter (Signed)
Spoke with Gloria Rush.  She is unable to come in today due to being at work and no transportation.  Asking if anything can be called in for her. States she knows something is going on with her, she can feel it in her lungs.  Will forward to Dr Ermalene Searing to see if she is willing to send in medication for her.

## 2012-11-14 NOTE — Telephone Encounter (Signed)
Pt left v/m requesting cb [id number].

## 2012-11-14 NOTE — Telephone Encounter (Signed)
Gloria Rush notified Rx for Z-pak has been sent in to her pharmacy.  To call back if not improving after course of antibiotics.

## 2012-11-16 ENCOUNTER — Other Ambulatory Visit: Payer: Self-pay

## 2012-12-10 ENCOUNTER — Other Ambulatory Visit: Payer: Self-pay | Admitting: Family Medicine

## 2013-01-08 ENCOUNTER — Other Ambulatory Visit: Payer: Self-pay | Admitting: Family Medicine

## 2013-01-31 ENCOUNTER — Ambulatory Visit: Payer: BC Managed Care – PPO | Admitting: Family Medicine

## 2013-01-31 DIAGNOSIS — Z0289 Encounter for other administrative examinations: Secondary | ICD-10-CM

## 2013-02-01 ENCOUNTER — Telehealth: Payer: Self-pay | Admitting: Family Medicine

## 2013-02-01 NOTE — Telephone Encounter (Signed)
Scheduled 02/07/2013

## 2013-02-01 NOTE — Telephone Encounter (Signed)
Yes, 30 min slot back to back

## 2013-02-01 NOTE — Telephone Encounter (Signed)
Pt needs adoption forms completed and the last time she had forms completed you did so during CPE.  She needs the forms by next week, but your first CPE apptmt is not until March.  Her husband will also need an apptmt and they would like to come together.  Can you accommodate them both a CPE to complete adoption papers one day next week? Thank you.

## 2013-02-02 ENCOUNTER — Other Ambulatory Visit (INDEPENDENT_AMBULATORY_CARE_PROVIDER_SITE_OTHER): Payer: BC Managed Care – PPO

## 2013-02-02 ENCOUNTER — Ambulatory Visit: Payer: BC Managed Care – PPO | Admitting: Internal Medicine

## 2013-02-02 DIAGNOSIS — Z0000 Encounter for general adult medical examination without abnormal findings: Secondary | ICD-10-CM

## 2013-02-02 DIAGNOSIS — Z131 Encounter for screening for diabetes mellitus: Secondary | ICD-10-CM

## 2013-02-02 DIAGNOSIS — Z13220 Encounter for screening for lipoid disorders: Secondary | ICD-10-CM

## 2013-02-02 DIAGNOSIS — E559 Vitamin D deficiency, unspecified: Secondary | ICD-10-CM

## 2013-02-02 LAB — CBC WITH DIFFERENTIAL/PLATELET
Basophils Absolute: 0 10*3/uL (ref 0.0–0.1)
Basophils Relative: 0.6 % (ref 0.0–3.0)
Eosinophils Absolute: 0.6 10*3/uL (ref 0.0–0.7)
Eosinophils Relative: 8.9 % — ABNORMAL HIGH (ref 0.0–5.0)
HCT: 37.5 % (ref 36.0–46.0)
HEMOGLOBIN: 12.4 g/dL (ref 12.0–15.0)
LYMPHS PCT: 28.4 % (ref 12.0–46.0)
Lymphs Abs: 1.9 10*3/uL (ref 0.7–4.0)
MCHC: 33 g/dL (ref 30.0–36.0)
MCV: 88.8 fl (ref 78.0–100.0)
MONOS PCT: 5.5 % (ref 3.0–12.0)
Monocytes Absolute: 0.4 10*3/uL (ref 0.1–1.0)
Neutro Abs: 3.7 10*3/uL (ref 1.4–7.7)
Neutrophils Relative %: 56.6 % (ref 43.0–77.0)
PLATELETS: 164 10*3/uL (ref 150.0–400.0)
RBC: 4.22 Mil/uL (ref 3.87–5.11)
RDW: 14.5 % (ref 11.5–14.6)
WBC: 6.6 10*3/uL (ref 4.5–10.5)

## 2013-02-02 LAB — HEPATIC FUNCTION PANEL
ALBUMIN: 3.8 g/dL (ref 3.5–5.2)
ALT: 13 U/L (ref 0–35)
AST: 15 U/L (ref 0–37)
Alkaline Phosphatase: 73 U/L (ref 39–117)
Bilirubin, Direct: 0.1 mg/dL (ref 0.0–0.3)
Total Bilirubin: 0.8 mg/dL (ref 0.3–1.2)
Total Protein: 6.8 g/dL (ref 6.0–8.3)

## 2013-02-02 LAB — LIPID PANEL
CHOL/HDL RATIO: 2
Cholesterol: 142 mg/dL (ref 0–200)
HDL: 61.1 mg/dL (ref >39.00)
LDL Cholesterol: 74 mg/dL (ref 0–99)
TRIGLYCERIDES: 36 mg/dL (ref 0.0–149.0)
VLDL: 7.2 mg/dL (ref 0.0–40.0)

## 2013-02-02 LAB — BASIC METABOLIC PANEL
BUN: 6 mg/dL (ref 6–23)
CHLORIDE: 105 meq/L (ref 96–112)
CO2: 27 meq/L (ref 19–32)
Calcium: 8.6 mg/dL (ref 8.4–10.5)
Creatinine, Ser: 0.7 mg/dL (ref 0.4–1.2)
GFR: 94.47 mL/min (ref >60.00)
Glucose, Bld: 77 mg/dL (ref 70–99)
Potassium: 3.7 mEq/L (ref 3.5–5.1)
SODIUM: 138 meq/L (ref 135–145)

## 2013-02-02 LAB — TSH: TSH: 0.27 u[IU]/mL — AB (ref 0.35–5.50)

## 2013-02-03 LAB — VITAMIN D 25 HYDROXY (VIT D DEFICIENCY, FRACTURES): Vit D, 25-Hydroxy: 32 ng/mL (ref 30–89)

## 2013-02-07 ENCOUNTER — Encounter: Payer: Self-pay | Admitting: Family Medicine

## 2013-02-07 ENCOUNTER — Ambulatory Visit (INDEPENDENT_AMBULATORY_CARE_PROVIDER_SITE_OTHER): Payer: BC Managed Care – PPO | Admitting: Family Medicine

## 2013-02-07 VITALS — BP 112/62 | HR 84 | Temp 98.2°F | Ht 65.25 in | Wt 126.5 lb

## 2013-02-07 DIAGNOSIS — R946 Abnormal results of thyroid function studies: Secondary | ICD-10-CM

## 2013-02-07 DIAGNOSIS — Z0000 Encounter for general adult medical examination without abnormal findings: Secondary | ICD-10-CM

## 2013-02-07 DIAGNOSIS — R7989 Other specified abnormal findings of blood chemistry: Secondary | ICD-10-CM

## 2013-02-07 MED ORDER — FLUTICASONE-SALMETEROL 45-21 MCG/ACT IN AERO: 2.0000 | INHALATION_SPRAY | Freq: Two times a day (BID) | RESPIRATORY_TRACT | Status: DC

## 2013-02-07 NOTE — Progress Notes (Signed)
Pre-visit discussion using our clinic review tool. No additional management support is needed unless otherwise documented below in the visit note.  

## 2013-02-08 NOTE — Progress Notes (Signed)
Date:  02/07/2013   Name:  Gloria Rush   DOB:  03/17/1975   MRN:  [id number] Gender: female Age: 38 y.o.  Primary Physician:  Owens Loffler, MD   Chief Complaint: Annual Exam   Subjective:   History of Present Illness:  Gloria Rush is a 38 y.o. pleasant patient who presents with the following:  Health Maintenance Summary Reviewed and updated, unless pt declines services.  Tobacco History Reviewed. Non-smoker Alcohol: No concerns, no excessive use Exercise Habits: Some activity, rec at least 30 mins 5 times a week STD concerns: none Drug Use: None Birth control method: none Menses regular: yes Lumps or breast concerns: no Breast Cancer Family History: Grandmother  UTD with paps  Health Maintenance  Topic Date Due  . Tetanus/tdap  03/31/1996  . Influenza Vaccine  08/11/2013  . Pap Smear  01/11/2014     Immunization History  Administered Date(s) Administered  . Influenza Split 12/24/2010  . Influenza Whole 12/27/2006, 10/10/2007, 11/19/2009  . Influenza-Unspecified 11/25/2012  . PPD Test 07/14/2010    Patient Active Problem List   Diagnosis Date Noted  . Unspecified vitamin D deficiency 08/24/2012  . Major depressive disorder, recurrent, in full remission 12/18/2009  . ALLERGIC CONJUNCTIVITIS 12/23/2008  . DERMATITIS, ATOPIC 12/23/2008  . Generalized anxiety disorder 01/23/2008  . GERD 12/05/2007  . ALLERGIC RHINITIS 12/27/2006  . ASTHMA 12/27/2006    Past Medical History  Diagnosis Date  . Allergy   . Asthma   . Anxiety     No past surgical history on file.  History   Social History  . Marital Status: Married    Spouse Name: N/A    Number of Children: N/A  . Years of Education: N/A   Occupational History  . Not on file.   Social History Main Topics  . Smoking status: Never Smoker   . Smokeless tobacco: Never Used  . Alcohol Use: No  . Drug Use: No  . Sexual Activity: Yes    Partners: Male   Other Topics Concern  . Not on  file   Social History Narrative  . No narrative on file    Family History  Problem Relation Age of Onset  . Anxiety disorder Mother   . Anxiety disorder Brother   . Diabetes Maternal Grandfather     Allergies  Allergen Reactions  . Dexilant [Dexlansoprazole] Other (See Comments)    Throat feels tight    Medication list has been reviewed and updated.  Review of Systems:   General: Denies fever, chills, sweats. No significant weight loss. Eyes: Denies blurring,significant itching ENT: Denies earache, sore throat, and hoarseness.  Cardiovascular: Denies chest pains, palpitations, dyspnea on exertion,  Respiratory: Denies cough, dyspnea at rest,wheeezing Breast: no concerns about lumps GI: Denies nausea, vomiting, diarrhea, constipation, change in bowel habits, abdominal pain, melena, hematochezia GU: Denies dysuria, hematuria, urinary hesitancy, nocturia, denies STD risk, no concerns about discharge Musculoskeletal: Denies back pain, joint pain Derm: Denies rash, itching Neuro: Denies  paresthesias, frequent falls, frequent headaches Psych: Denies depression, anxiety Endocrine: Denies cold intolerance, heat intolerance, polydipsia Heme: Denies enlarged lymph nodes Allergy: No hayfever  Objective:   Physical Examination: BP 112/62  Pulse 84  Temp(Src) 98.2 F (36.8 C) (Oral)  Ht 5' 5.25" (1.657 m)  Wt 126 lb 8 oz (57.38 kg)  BMI 20.90 kg/m2  SpO2 98%  LMP 01/29/2013  Ideal Body Weight: Weight in (lb) to have BMI = 25: 151.1  GEN: well developed, well nourished,  no acute distress Eyes: conjunctiva and lids normal, PERRLA, EOMI ENT: TM clear, nares clear, oral exam WNL Neck: supple, no lymphadenopathy, no thyromegaly, no JVD Pulm: clear to auscultation and percussion, respiratory effort normal CV: regular rate and rhythm, S1-S2, no murmur, rub or gallop, no bruits Chest: no scars, masses, no lumps BREAST: breast exam declined GI: soft, non-tender; no  hepatosplenomegaly, masses; active bowel sounds all quadrants GU: GU exam declined Lymph: no cervical, axillary or inguinal adenopathy MSK: gait normal, muscle tone and strength WNL, no joint swelling, effusions, discoloration, crepitus  SKIN: clear, good turgor, color WNL, no rashes, lesions, or ulcerations Neuro: normal mental status, normal strength, sensation, and motion Psych: alert; oriented to person, place and time, normally interactive and not anxious or depressed in appearance.   All labs reviewed with patient. Lipids:    Component Value Date/Time   CHOL 142 02/02/2013 1248   TRIG 36.0 02/02/2013 1248   HDL 61.10 02/02/2013 1248   LDLDIRECT 77.8 11/13/2009 1718   VLDL 7.2 02/02/2013 1248   CHOLHDL 2 02/02/2013 1248    CBC:    Component Value Date/Time   WBC 6.6 02/02/2013 1248   HGB 12.4 02/02/2013 1248   HCT 37.5 02/02/2013 1248   PLT 164.0 02/02/2013 1248   MCV 88.8 02/02/2013 1248   NEUTROABS 3.7 02/02/2013 1248   LYMPHSABS 1.9 02/02/2013 1248   MONOABS 0.4 02/02/2013 1248   EOSABS 0.6 02/02/2013 1248   BASOSABS 0.0 02/02/2013 9983    Basic Metabolic Panel:    Component Value Date/Time   NA 138 02/02/2013 1248   K 3.7 02/02/2013 1248   CL 105 02/02/2013 1248   CO2 27 02/02/2013 1248   BUN 6 02/02/2013 1248   CREATININE 0.7 02/02/2013 1248   GLUCOSE 77 02/02/2013 1248   CALCIUM 8.6 02/02/2013 1248    Lab Results  Component Value Date   ALT 13 02/02/2013   AST 15 02/02/2013   ALKPHOS 73 02/02/2013   BILITOT 0.8 02/02/2013    Lab Results  Component Value Date   TSH 0.27* 02/02/2013     No results found.  Assessment & Plan:   Health Maintenance Exam: The patient's preventative maintenance and recommended screening tests for an annual wellness exam were reviewed in full today. Brought up to date unless services declined.  Counselled on the importance of diet, exercise, and its role in overall health and mortality. The patient's FH and SH was reviewed, including their  home life, tobacco status, and drug and alcohol status.   Routine general medical examination at a health care facility  Abnormal TSH - Plan: TSH, T4, free  She is overall doing great.  Slightly abnormal TSH - repeat in a month. ? If the tea she drinks has iodine, which could influence.   Completed adoption paperwork.  Orders Placed This Encounter  Procedures  . TSH  . T4, free    New medications, updates to list, dose adjustments: Meds ordered this encounter  Medications  . PATADAY 0.2 % SOLN    Sig: daily.  Marland Kitchen levocetirizine (XYZAL) 5 MG tablet    Sig: daily.  . fluticasone-salmeterol (ADVAIR HFA) 45-21 MCG/ACT inhaler    Sig: Inhale 2 puffs into the lungs 2 (two) times daily.    Dispense:  1 Inhaler    Refill:  0    Signed,  Lakedra Washington T. Sala Tague, MD, [place] [facility] [place] [address] Phone: [phone] Fax: [phone]  Medication List       This list is accurate as of: 02/07/13 11:59 PM.  Always use your most recent med list.               albuterol (2.5 MG/3ML) 0.083% nebulizer solution  Commonly known as:  PROVENTIL  Take 2.5 mg by nebulization every 6 (six) hours as needed.     albuterol 108 (90 BASE) MCG/ACT inhaler  Commonly known as:  VENTOLIN HFA  Inhale 2 puffs into the lungs every 4 (four) hours as needed for wheezing.     diphenhydrAMINE 25 mg capsule  Commonly known as:  BENADRYL  Take 25 mg by mouth every 6 (six) hours as needed.     escitalopram 20 MG tablet  Commonly known as:  LEXAPRO  TAKE 1/2 TABLET BY MOUTH DAILY.     fluticasone-salmeterol 45-21 MCG/ACT inhaler  Commonly known as:  ADVAIR HFA  Inhale 2 puffs into the lungs 2 (two) times daily.     levocetirizine 5 MG tablet  Commonly known as:  XYZAL  daily.     mupirocin ointment 2 %  Commonly known as:  BACTROBAN  Apply topically 3 (three) times daily.     PATADAY 0.2 % Soln  Generic drug:  Olopatadine HCl   daily.

## 2013-02-18 ENCOUNTER — Encounter: Payer: Self-pay | Admitting: Family Medicine

## 2013-02-20 ENCOUNTER — Encounter: Payer: Self-pay | Admitting: Family Medicine

## 2013-04-11 ENCOUNTER — Encounter: Payer: Self-pay | Admitting: Family Medicine

## 2013-04-12 ENCOUNTER — Encounter: Payer: Self-pay | Admitting: Family Medicine

## 2013-04-12 ENCOUNTER — Ambulatory Visit (INDEPENDENT_AMBULATORY_CARE_PROVIDER_SITE_OTHER): Payer: BC Managed Care – PPO | Admitting: Family Medicine

## 2013-04-12 VITALS — BP 90/70 | HR 73 | Temp 98.1°F | Ht 65.25 in | Wt 133.2 lb

## 2013-04-12 DIAGNOSIS — N898 Other specified noninflammatory disorders of vagina: Secondary | ICD-10-CM

## 2013-04-12 DIAGNOSIS — N840 Polyp of corpus uteri: Secondary | ICD-10-CM

## 2013-04-12 DIAGNOSIS — Z1509 Genetic susceptibility to other malignant neoplasm: Secondary | ICD-10-CM

## 2013-04-12 DIAGNOSIS — R300 Dysuria: Secondary | ICD-10-CM

## 2013-04-12 LAB — POCT URINALYSIS DIPSTICK
Bilirubin, UA: NEGATIVE
Blood, UA: NEGATIVE
Glucose, UA: NEGATIVE
KETONES UA: NEGATIVE
NITRITE UA: NEGATIVE
PH UA: 6
Spec Grav, UA: 1.025
Urobilinogen, UA: 0.2

## 2013-04-12 MED ORDER — FLUCONAZOLE 150 MG PO TABS: 150.0000 mg | ORAL_TABLET | Freq: Once | ORAL | Status: DC

## 2013-04-12 NOTE — Progress Notes (Signed)
Date:  04/12/2013   Name:  Gloria Rush   DOB:  21-[name]-1975   MRN:  [id number]  Primary Physician:  Owens Loffler, MD   Chief Complaint: Vaginitis and Sinusitis   Subjective:   History of Present Illness:  Gloria Rush is a 38 y.o. pleasant patient who presents with the following:  F/u discussion: Lynch syndrome. See below.   TJ's aunt died and had a stroke.  Hurts in lower abdomen.  Some burning with urination.  Increased vaginal discharge and itching.   LMP 2 weeks ago.  No exposures monogamous > a decade.    Patient Active Problem List   Diagnosis Date Noted  . Lynch syndrome 04/13/2013    Priority: High  . Major depressive disorder, recurrent, in full remission 12/18/2009    Priority: Medium  . Panic disorder without agoraphobia with panic attacks in partial remission 01/23/2008    Priority: Medium  . Asthma, moderate persistent, well-controlled 12/27/2006    Priority: Medium  . Unspecified vitamin D deficiency 08/24/2012  . ALLERGIC CONJUNCTIVITIS 12/23/2008  . DERMATITIS, ATOPIC 12/23/2008  . GERD 12/05/2007  . ALLERGIC RHINITIS 12/27/2006    Past Medical History  Diagnosis Date  . Allergy   . Asthma   . Anxiety     No past surgical history on file.  History   Social History  . Marital Status: Married    Spouse Name: N/A    Number of Children: N/A  . Years of Education: N/A   Occupational History  . Not on file.   Social History Main Topics  . Smoking status: Never Smoker   . Smokeless tobacco: Never Used  . Alcohol Use: No  . Drug Use: No  . Sexual Activity: Yes    Partners: Male   Other Topics Concern  . Not on file   Social History Narrative  . No narrative on file    Family History  Problem Relation Age of Onset  . Anxiety disorder Mother   . Anxiety disorder Brother   . Diabetes Maternal Grandfather     Allergies  Allergen Reactions  . Dexilant [Dexlansoprazole] Other (See Comments)    Throat feels tight     Medication list has been reviewed and updated.  Review of Systems:   GEN: No acute illnesses, no fevers, chills. GI: mild nausea no v/d, eating normally Pulm: No SOB Interactive and getting along well at home.  Otherwise, ROS is as per the HPI.  Objective:   Physical Examination: BP 90/70  Pulse 73  Temp(Src) 98.1 F (36.7 C) (Oral)  Ht 5' 5.25" (1.657 m)  Wt 133 lb 4 oz (60.442 kg)  BMI 22.01 kg/m2  Ideal Body Weight: Weight in (lb) to have BMI = 25: 151.1   GEN: WDWN, NAD, Non-toxic, A & O x 3 HEENT: Atraumatic, Normocephalic. Neck supple. No masses, No LAD. Ears and Nose: No external deformity. CV: RRR, No M/G/R. No JVD. No thrill. No extra heart sounds. PULM: CTA B, no wheezes, crackles, rhonchi. No retractions. No resp. distress. No accessory muscle use. ABD: S, NT, ND, + BS, No rebound, No HSM  EXTR: No c/c/e NEURO Normal gait.  PSYCH: Normally interactive. Conversant. Not depressed or anxious appearing.  Calm demeanor.   Laboratory and Imaging Data: Results for orders placed in visit on 04/12/13  POCT URINALYSIS DIPSTICK      Result Value Ref Range   Color, UA yellow     Clarity, UA slightly cloudy  Glucose, UA negative     Bilirubin, UA negative     Ketones, UA negative     Spec Grav, UA 1.025     Blood, UA negative     pH, UA 6.0     Protein, UA trace     Urobilinogen, UA 0.2     Nitrite, UA negative     Leukocytes, UA moderate (2+)       Assessment & Plan:   Lynch syndrome - Plan: Ambulatory referral to Gastroenterology, Ambulatory referral to Obstetrics / Gynecology, CANCELED: Ambulatory referral to Gynecology  Uterine polyp - Plan: Ambulatory referral to Obstetrics / Gynecology, CANCELED: Ambulatory referral to Gynecology  Dysuria - Plan: POCT Urinalysis Dipstick  Vaginal discharge  >25 minutes spent in face to face time with patient, >50% spent in counselling or coordination of care: discharge itching. Cannot fully exclude urinary  tract infection, but urinalysis is normal with leukocyte esterase only. Most likely consistent with simple yeast. She did not want to have a pelvic exam by a female, which is certainly reasonable. If she does not improve with Diflucan, then we can reassess.  Primarily wanted the patient to come in to discuss the Lynch Syndrome genetic testing that she had by her gynecologist. This took me sometime to actually get the name of condition, and I do not have the genetic tests available at hand, which the patient has at home. She will bring a copy in for our records.  I did some research earlier in the week regarding Lynch syndrome. This is a syndrome where there is a markedly increased risk of colorectal cancer, approximately 80 percent lifetime colorectal cancer risk without familial polyposis. There also various increased risks for gynecological cancers including uterine and ovarian cancers. There also is a recommendation for early mammography.  Most of this has been discussed with her previously. She did have an ultrasound of her uterus which was normal with the exception of one polyp. Her prior gynecologist did tell her that this needed to be removed, however the only person that could do this would be a female in their practice, and she is uncomfortable with that. She also would like to get a 2nd opinion in general and transfer her care to a different gynecological group. I think that that is certainly reasonable, and this is a complex issue from a genetic in academic standpoint. I recommended that she see one of the teaching faculty with [facility]. I recommended f/u with Dr. Kennon Rounds, Hulan Fray, or [place]. I feel certain they would provide excellent care and be on top of the latest academic recommendations.  From a gastrointestinal standpoint, this is quite complex, and from my reading it appears as if the patient would need to have colonoscopy every one to 2 years. This is well beyond the scope of my  practice, and my recommendation was to see a physician who has recently gotten out of GI fellowship. My recommendation was too see Dr. Hilarie Fredrickson, who I suspect has experienced in these matters. To me, it also appears that [facility] has a Lynch syndrome center.   I appreciate their care and help with this very nice patient.  Orders Placed This Encounter  Procedures  . Ambulatory referral to Gastroenterology  . Ambulatory referral to Obstetrics / Gynecology  . POCT Urinalysis Dipstick   Patient's Medications  New Prescriptions   FLUCONAZOLE (DIFLUCAN) 150 MG TABLET    Take 1 tablet (150 mg total) by mouth once.  Previous Medications   ALBUTEROL (  PROVENTIL) (2.5 MG/3ML) 0.083% NEBULIZER SOLUTION    Take 2.5 mg by nebulization every 6 (six) hours as needed.     ALBUTEROL (VENTOLIN HFA) 108 (90 BASE) MCG/ACT INHALER    Inhale 2 puffs into the lungs every 4 (four) hours as needed for wheezing.   DIPHENHYDRAMINE (BENADRYL) 25 MG CAPSULE    Take 25 mg by mouth every 6 (six) hours as needed.   ESCITALOPRAM (LEXAPRO) 20 MG TABLET    TAKE 1/2 TABLET BY MOUTH DAILY.   LEVOCETIRIZINE (XYZAL) 5 MG TABLET    daily.   MUPIROCIN OINTMENT (BACTROBAN) 2 %    Apply topically 3 (three) times daily.   PATADAY 0.2 % SOLN    daily.  Modified Medications   Modified Medication Previous Medication   FLUTICASONE-SALMETEROL (ADVAIR HFA) 45-21 MCG/ACT INHALER fluticasone-salmeterol (ADVAIR HFA) 45-21 MCG/ACT inhaler      Inhale 1 puff into the lungs 2 (two) times daily.    Inhale 2 puffs into the lungs 2 (two) times daily.  Discontinued Medications   No medications on file   Patient Instructions  REFERRALS TO SPECIALISTS, SPECIAL TESTS (MRI, CT, ULTRASOUNDS)  GO THE WAITING ROOM AND TELL CHECK IN YOU NEED HELP WITH A REFERRAL. Either [facility] or [facility] will help you set it up.  If it is between 1-2 PM they may be at lunch.  After 5 PM, they will likely be at home.  They will call you, so please make sure the office has  your correct phone number.  Referrals sometimes can be done same day if urgent, but others can take 2 or 3 days to get an appointment. Starting in 2015, many of the new Medicare insurance plans and [place] (Obamacare) Health plans offered take much longer for referrals. They have added additional paperwork and steps.  MRI's and CT's can take up to a week for the test. (Emergencies like strokes take precedence. I will tell you if you have an emergency.)   If your referral is to an [facility] office, their office may contact you directly prior to our office reaching you.  -- Examples: [facility], [facility], [place], [place]            Neurology, [facility], and many more.  Specialist appointment times vary a great deal, mostly on the specialist's schedule and if they have openings. -- Our office tries to get you in as fast as possible. -- Some specialists have very long wait times. (Example. Dermatology. Usually months) -- If you have a true emergency like new cancer, we work to get you in ASAP.     Signed,  Maud Deed. Jamario Colina, MD, [place] at [facility] [place] [address] Phone: [phone] Fax: [phone]

## 2013-04-12 NOTE — Patient Instructions (Signed)
REFERRALS TO SPECIALISTS, SPECIAL TESTS (MRI, CT, ULTRASOUNDS)  GO THE WAITING ROOM AND TELL CHECK IN YOU NEED HELP WITH A REFERRAL. Either [facility] or [facility] will help you set it up.  If it is between 1-2 PM they may be at lunch.  After 5 PM, they will likely be at home.  They will call you, so please make sure the office has your correct phone number.  Referrals sometimes can be done same day if urgent, but others can take 2 or 3 days to get an appointment. Starting in 2015, many of the new Medicare insurance plans and Affordable Care Act (Obamacare) Health plans offered take much longer for referrals. They have added additional paperwork and steps.  MRI's and CT's can take up to a week for the test. (Emergencies like strokes take precedence. I will tell you if you have an emergency.)   If your referral is to an in-network [facility] office, their office may contact you directly prior to our office reaching you.  -- Examples:  Cardiology, [facility], [facility], [place]            Neurology, [facility], and many more.  Specialist appointment times vary a great deal, mostly on the specialist's schedule and if they have openings. -- Our office tries to get you in as fast as possible. -- Some specialists have very long wait times. (Example. Dermatology. Usually months) -- If you have a true emergency like new cancer, we work to get you in ASAP.   

## 2013-04-12 NOTE — Progress Notes (Signed)
Pre visit review using our clinic review tool, if applicable. No additional management support is needed unless otherwise documented below in the visit note. 

## 2013-04-12 NOTE — Telephone Encounter (Signed)
Can you have her f/u today? i had some other things i wanted to talk to her about.

## 2013-04-12 NOTE — Telephone Encounter (Signed)
Appointment scheduled for 04/12/2013 @ 5:15pm.  Gloria Rush is aware of appointment.

## 2013-04-13 DIAGNOSIS — Z1509 Genetic susceptibility to other malignant neoplasm: Secondary | ICD-10-CM | POA: Insufficient documentation

## 2013-04-18 ENCOUNTER — Encounter: Payer: Self-pay | Admitting: Internal Medicine

## 2013-05-09 ENCOUNTER — Encounter: Payer: BC Managed Care – PPO | Admitting: Family Medicine

## 2013-05-17 ENCOUNTER — Ambulatory Visit (INDEPENDENT_AMBULATORY_CARE_PROVIDER_SITE_OTHER): Payer: BC Managed Care – PPO | Admitting: Family Medicine

## 2013-05-17 ENCOUNTER — Encounter: Payer: Self-pay | Admitting: Family Medicine

## 2013-05-17 VITALS — BP 119/71 | HR 76 | Ht 65.0 in | Wt 133.0 lb

## 2013-05-17 DIAGNOSIS — Z1509 Genetic susceptibility to other malignant neoplasm: Secondary | ICD-10-CM

## 2013-05-17 DIAGNOSIS — N840 Polyp of corpus uteri: Secondary | ICD-10-CM

## 2013-05-17 DIAGNOSIS — J329 Chronic sinusitis, unspecified: Secondary | ICD-10-CM | POA: Insufficient documentation

## 2013-05-17 DIAGNOSIS — Z01812 Encounter for preprocedural laboratory examination: Secondary | ICD-10-CM

## 2013-05-17 LAB — POCT URINE PREGNANCY: Preg Test, Ur: NEGATIVE

## 2013-05-17 MED ORDER — AZITHROMYCIN 250 MG PO TABS: ORAL_TABLET | ORAL | Status: DC

## 2013-05-17 NOTE — Progress Notes (Signed)
    Subjective:    Patient ID: Gloria Rush is a 38 y.o. female presenting with uterine polyp  on 05/17/2013  HPI: New pt. For second opinion.  Previoulsy seen at [facility].  Underwent BRCA testing for fh breast ca.  Found to have Heath.  Had pelvic sono which showed endometrial polyp.  Normal ovaries.  Current guidelines suggest annual screening with pelvic sono and endometrial sampling with complete hysterectomy and BSO following completion of childbearing.  Pt. With a long h/o infertility and currently awaiting adoption.  Review of Systems  Constitutional: Negative for fever, chills and unexpected weight change.  Respiratory: Negative for shortness of breath.   Cardiovascular: Negative for chest pain and leg swelling.  Gastrointestinal: Negative for nausea, vomiting and abdominal pain.  Genitourinary: Negative for dysuria and vaginal bleeding.  Psychiatric/Behavioral: Negative for dysphoric mood.      Objective:    BP 119/71  Pulse 76  Ht $R'5\' 5"'yz$  (1.651 m)  Wt 133 lb (60.328 kg)  BMI 22.13 kg/m2  LMP 04/27/2013 Physical Exam  Constitutional: She is oriented to person, place, and time. She appears well-developed and well-nourished. No distress.  HENT:  Head: Normocephalic and atraumatic.  Eyes: No scleral icterus.  Cardiovascular: Normal rate and regular rhythm.   Pulmonary/Chest: Effort normal.  Abdominal: Soft. There is no tenderness.  Genitourinary: Vagina normal and uterus normal.  Musculoskeletal: She exhibits no edema.  Neurological: She is alert and oriented to person, place, and time.  Skin: Skin is warm and dry. No rash noted.  Psychiatric: She has a normal mood and affect.    Procedure: Patient given informed consent, signed copy in the chart, time out was performed. Appropriate time out taken. . The patient was placed in the lithotomy position and the cervix brought into view with sterile speculum.  Portio of cervix cleansed x 2 with betadine swabs.  A  tenaculum was placed in the anterior lip of the cervix.  The uterus was sounded for depth of 8 cm. A pipelle was introduced to into the uterus, suction created,  and an endometrial sample was obtained. All equipment was removed and accounted for.  The patient tolerated the procedure well.        Assessment & Plan:   Lynch syndrome Reviewed recommendations in Uptodate--for annual TVUS, and EMB.  Will attempt to get records from previous OB/GYN with pap and [place].  EMB performed today.  Discussed recommendation for hysterectomy with patient.  She is unwilling to undergo this right now. Has appointment with GI for colonoscopy.  Desires mammogram--will schedule although current guidelines do not suggest she needs earlier screening, her family hx of cancers only involves cancer of the breast.    Return in about 1 year (around 05/18/2014) for endometrial biopsy, TVUS.

## 2013-05-17 NOTE — Patient Instructions (Addendum)
Endometrial Biopsy Endometrial biopsy is a procedure in which a tissue sample is taken from inside the uterus. The tissue sample is then looked at under a microscope to see if the tissue is normal or abnormal. The endometrium is the lining of the uterus. This procedure helps determine where you are in your menstrual cycle and how hormone levels are affecting the lining of the uterus. This procedure may also be used to evaluate uterine bleeding or to diagnose endometrial cancer, tuberculosis, polyps, or inflammatory conditions.  LET [facility] CARE PROVIDER KNOW ABOUT:  Any allergies you have.  All medicines you are taking, including vitamins, herbs, eye drops, creams, and over-the-counter medicines.  Previous problems you or members of your family have had with the use of anesthetics.  Any blood disorders you have.  Previous surgeries you have had.  Medical conditions you have.  Possibility of pregnancy. RISKS AND COMPLICATIONS Generally, this is a safe procedure. However, as with any procedure, complications can occur. Possible complications include:  Bleeding.  Pelvic infection.  Puncture of the uterine wall with the biopsy device (rare). BEFORE THE PROCEDURE   Keep a record of your menstrual cycles as directed by your health care provider. You may need to schedule your procedure for a specific time in your cycle.  You may want to bring a sanitary pad to wear home after the procedure.  Arrange for someone to drive you home after the procedure if you will be given a medicine to help you relax (sedative). PROCEDURE   You may be given a sedative to relax you.  You will lie on an exam table with your feet and legs supported as in a pelvic exam.  Your health care provider will insert an instrument (speculum) into your vagina to see your cervix.  Your cervix will be cleansed with an antiseptic solution. A medicine (local anesthetic) will be used to numb the cervix.  A forceps  instrument (tenaculum) will be used to hold your cervix steady for the biopsy.  A thin, rodlike instrument (uterine sound) will be inserted through your cervix to determine the length of your uterus and the location where the biopsy sample will be removed.  A thin, flexible tube (catheter) will be inserted through your cervix and into the uterus. The catheter is used to collect the biopsy sample from your endometrial tissue.  The catheter and speculum will then be removed, and the tissue sample will be sent to a lab for examination. AFTER THE PROCEDURE  You will rest in a recovery area until you are ready to go home.  You may have mild cramping and a small amount of vaginal bleeding for a few days after the procedure. This is normal.  Make sure you find out how to get your test results. Document Released: 04/30/2004 Document Revised: 08/30/2012 Document Reviewed: 06/14/2012 [facility] Patient Information 2014 [place], [place]. Sinusitis Sinusitis is redness, soreness, and swelling (inflammation) of the paranasal sinuses. Paranasal sinuses are air pockets within the bones of your face (beneath the eyes, the middle of the forehead, or above the eyes). In healthy paranasal sinuses, mucus is able to drain out, and air is able to circulate through them by way of your nose. However, when your paranasal sinuses are inflamed, mucus and air can become trapped. This can allow bacteria and other germs to grow and cause infection. Sinusitis can develop quickly and last only a short time (acute) or continue over a long period (chronic). Sinusitis that lasts for more than  12 weeks is considered chronic.  CAUSES  Causes of sinusitis include:  Allergies.  Structural abnormalities, such as displacement of the cartilage that separates your nostrils (deviated septum), which can decrease the air flow through your nose and sinuses and affect sinus drainage.  Functional abnormalities, such as when the small hairs  (cilia) that line your sinuses and help remove mucus do not work properly or are not present. SYMPTOMS  Symptoms of acute and chronic sinusitis are the same. The primary symptoms are pain and pressure around the affected sinuses. Other symptoms include:  Upper toothache.  Earache.  Headache.  Bad breath.  Decreased sense of smell and taste.  A cough, which worsens when you are lying flat.  Fatigue.  Fever.  Thick drainage from your nose, which often is green and may contain pus (purulent).  Swelling and warmth over the affected sinuses. DIAGNOSIS  Your caregiver will perform a physical exam. During the exam, your caregiver may:  Look in your nose for signs of abnormal growths in your nostrils (nasal polyps).  Tap over the affected sinus to check for signs of infection.  View the inside of your sinuses (endoscopy) with a special imaging device with a light attached (endoscope), which is inserted into your sinuses. If your caregiver suspects that you have chronic sinusitis, one or more of the following tests may be recommended:  Allergy tests.  Nasal culture A sample of mucus is taken from your nose and sent to a lab and screened for bacteria.  Nasal cytology A sample of mucus is taken from your nose and examined by your caregiver to determine if your sinusitis is related to an allergy. TREATMENT  Most cases of acute sinusitis are related to a viral infection and will resolve on their own within 10 days. Sometimes medicines are prescribed to help relieve symptoms (pain medicine, decongestants, nasal steroid sprays, or saline sprays).  However, for sinusitis related to a bacterial infection, your caregiver will prescribe antibiotic medicines. These are medicines that will help kill the bacteria causing the infection.  Rarely, sinusitis is caused by a fungal infection. In theses cases, your caregiver will prescribe antifungal medicine. For some cases of chronic sinusitis,  surgery is needed. Generally, these are cases in which sinusitis recurs more than 3 times per year, despite other treatments. HOME CARE INSTRUCTIONS   Drink plenty of water. Water helps thin the mucus so your sinuses can drain more easily.  Use a humidifier.  Inhale steam 3 to 4 times a day (for example, sit in the bathroom with the shower running).  Apply a warm, moist washcloth to your face 3 to 4 times a day, or as directed by your caregiver.  Use saline nasal sprays to help moisten and clean your sinuses.  Take over-the-counter or prescription medicines for pain, discomfort, or fever only as directed by your caregiver. SEEK IMMEDIATE MEDICAL CARE IF:  You have increasing pain or severe headaches.  You have nausea, vomiting, or drowsiness.  You have swelling around your face.  You have vision problems.  You have a stiff neck.  You have difficulty breathing. MAKE SURE YOU:   Understand these instructions.  Will watch your condition.  Will get help right away if you are not doing well or get worse. Document Released: 12/28/2004 Document Revised: 03/22/2011 Document Reviewed: 01/12/2011 [facility] Patient Information 2014 [place], [place].

## 2013-05-18 NOTE — Assessment & Plan Note (Signed)
Reviewed recommendations in Uptodate--for annual TVUS, and EMB.  Will attempt to get records from previous OB/GYN with pap and [place].  EMB performed today.  Discussed recommendation for hysterectomy with patient.  She is unwilling to undergo this right now. Has appointment with GI for colonoscopy.  Desires mammogram--will schedule although current guidelines do not suggest she needs earlier screening, her family hx of cancers only involves cancer of the breast.

## 2013-05-24 ENCOUNTER — Encounter: Payer: Self-pay | Admitting: Family Medicine

## 2013-06-07 ENCOUNTER — Encounter: Payer: Self-pay | Admitting: Internal Medicine

## 2013-06-08 ENCOUNTER — Ambulatory Visit: Payer: BC Managed Care – PPO | Admitting: Internal Medicine

## 2013-06-08 ENCOUNTER — Other Ambulatory Visit: Payer: Self-pay | Admitting: Family Medicine

## 2013-06-13 ENCOUNTER — Other Ambulatory Visit: Payer: Self-pay | Admitting: Family Medicine

## 2013-08-16 ENCOUNTER — Encounter: Payer: Self-pay | Admitting: *Deleted

## 2013-08-23 ENCOUNTER — Ambulatory Visit: Payer: BC Managed Care – PPO | Admitting: Internal Medicine

## 2013-10-01 ENCOUNTER — Encounter: Payer: Self-pay | Admitting: Family Medicine

## 2013-10-01 ENCOUNTER — Ambulatory Visit (INDEPENDENT_AMBULATORY_CARE_PROVIDER_SITE_OTHER): Payer: BC Managed Care – PPO | Admitting: Family Medicine

## 2013-10-01 VITALS — BP 100/72 | HR 69 | Temp 98.3°F | Ht 65.0 in | Wt 138.8 lb

## 2013-10-01 DIAGNOSIS — J069 Acute upper respiratory infection, unspecified: Secondary | ICD-10-CM

## 2013-10-01 DIAGNOSIS — J4540 Moderate persistent asthma, uncomplicated: Secondary | ICD-10-CM

## 2013-10-01 DIAGNOSIS — J45909 Unspecified asthma, uncomplicated: Secondary | ICD-10-CM

## 2013-10-01 MED ORDER — AMOXICILLIN-POT CLAVULANATE 875-125 MG PO TABS: 1.0000 | ORAL_TABLET | Freq: Two times a day (BID) | ORAL | Status: DC

## 2013-10-01 MED ORDER — FLUCONAZOLE 150 MG PO TABS: 150.0000 mg | ORAL_TABLET | Freq: Once | ORAL | Status: DC

## 2013-10-01 NOTE — Progress Notes (Signed)
Dr. Frederico Hamman T. Baylin Gamblin, MD, [facility] [place], [address] Phone: [phone] Fax: [phone]  10/01/2013  Patient: Gloria Rush, MRN: [id number], DOB: [birth year], 38 y.o.  Primary Physician:  Owens Loffler, MD  Chief Complaint: Sinusitis and Fatigue  Subjective:   Gloria Rush is a 38 y.o. very pleasant female patient who presents with the following:  Sinus and throat symptoms. Breathing ok. Using inhaler 1-2 times a week. No fever. No strep last time. Achy a little.  Overall, does not feel that well. Husband was sick last week. No significant wheezing.   Only using Advair once a day.    Past Medical History, Surgical History, Social History, Family History, Problem List, Medications, and Allergies have been reviewed and updated if relevant.  ROS: GEN: Acute illness details above GI: Tolerating PO intake GU: maintaining adequate hydration and urination Pulm: No SOB Interactive and getting along well at home.  Otherwise, ROS is as per the HPI.   Objective:   BP 100/72  Pulse 69  Temp(Src) 98.3 F (36.8 C) (Oral)  Ht 5\' 5"  (1.651 m)  Wt 138 lb 12 oz (62.937 kg)  BMI 23.09 kg/m2  LMP 08/31/2013   Gen: WDWN, NAD; A & O x3, cooperative. Pleasant.Globally Non-toxic HEENT: Normocephalic and atraumatic. Throat clear, w/o exudate, R TM clear, L TM - good landmarks, No fluid present. rhinnorhea.  MMM Frontal sinuses: NT Max sinuses: mildly tender NECK: Anterior cervical  LAD is absent CV: RRR, No M/G/R, cap refill <2 sec PULM: Breathing comfortably in no respiratory distress. no wheezing, crackles, rhonchi EXT: No c/c/e PSYCH: Friendly, good eye contact MSK: Nml gait     Laboratory and Imaging Data:  Assessment and Plan:   URI (upper respiratory infection)  Asthma, moderate persistent, well-controlled  Probable uri with mild asthma exacerbation. Hold abx - if facial pain worsens or  fever, ok to fill  Increase advair to bid Albuterol prn No wheezing right now  Follow-up: No Follow-up on file.  New Prescriptions   AMOXICILLIN-CLAVULANATE (AUGMENTIN) 875-125 MG PER TABLET    Take 1 tablet by mouth 2 (two) times daily.   FLUCONAZOLE (DIFLUCAN) 150 MG TABLET    Take 1 tablet (150 mg total) by mouth once.   No orders of the defined types were placed in this encounter.    Signed,  Maud Deed. Syanna Remmert, MD   Patient's Medications  New Prescriptions   AMOXICILLIN-CLAVULANATE (AUGMENTIN) 875-125 MG PER TABLET    Take 1 tablet by mouth 2 (two) times daily.   FLUCONAZOLE (DIFLUCAN) 150 MG TABLET    Take 1 tablet (150 mg total) by mouth once.  Previous Medications   ALBUTEROL (PROVENTIL) (2.5 MG/3ML) 0.083% NEBULIZER SOLUTION    Take 2.5 mg by nebulization every 6 (six) hours as needed.     CETIRIZINE (ZYRTEC) 10 MG TABLET    Take 10 mg by mouth daily.   ESCITALOPRAM (LEXAPRO) 20 MG TABLET    TAKE 1/2 TABLET BY MOUTH DAILY.   FLUTICASONE-SALMETEROL (ADVAIR HFA) 45-21 MCG/ACT INHALER    Inhale 1 puff into the lungs 2 (two) times daily.   MUPIROCIN OINTMENT (BACTROBAN) 2 %    Apply topically 3 (three) times daily.   VENTOLIN HFA 108 (90 BASE) MCG/ACT INHALER    USE 2 PUFFS EVERY 4 HOURS AS NEEDED FOR WHEEZING  Modified Medications   No medications on file  Discontinued Medications   AZITHROMYCIN (ZITHROMAX) 250 MG TABLET  2 po on day 1, then 1 po daily x 4 days   LEVOCETIRIZINE (XYZAL) 5 MG TABLET    daily.

## 2013-10-01 NOTE — Progress Notes (Signed)
Pre visit review using our clinic review tool, if applicable. No additional management support is needed unless otherwise documented below in the visit note. 

## 2013-10-25 ENCOUNTER — Encounter: Payer: Self-pay | Admitting: Family Medicine

## 2013-10-25 MED ORDER — PANTOPRAZOLE SODIUM 40 MG PO TBEC: 40.0000 mg | DELAYED_RELEASE_TABLET | Freq: Every day | ORAL | Status: DC

## 2013-11-20 ENCOUNTER — Encounter: Payer: Self-pay | Admitting: Family Medicine

## 2013-11-27 MED ORDER — FLUTICASONE-SALMETEROL 115-21 MCG/ACT IN AERO: 2.0000 | INHALATION_SPRAY | Freq: Two times a day (BID) | RESPIRATORY_TRACT | Status: DC

## 2013-11-27 NOTE — Telephone Encounter (Signed)
Increased advair dose

## 2013-12-11 ENCOUNTER — Other Ambulatory Visit: Payer: Self-pay | Admitting: Family Medicine

## 2013-12-21 ENCOUNTER — Encounter: Payer: Self-pay | Admitting: Family Medicine

## 2013-12-21 ENCOUNTER — Ambulatory Visit (INDEPENDENT_AMBULATORY_CARE_PROVIDER_SITE_OTHER): Payer: BC Managed Care – PPO | Admitting: Family Medicine

## 2013-12-21 VITALS — BP 108/72 | HR 78 | Temp 98.0°F | Ht 65.0 in | Wt 132.2 lb

## 2013-12-21 DIAGNOSIS — J019 Acute sinusitis, unspecified: Secondary | ICD-10-CM | POA: Insufficient documentation

## 2013-12-21 DIAGNOSIS — J0100 Acute maxillary sinusitis, unspecified: Secondary | ICD-10-CM

## 2013-12-21 DIAGNOSIS — Z23 Encounter for immunization: Secondary | ICD-10-CM

## 2013-12-21 MED ORDER — AMOXICILLIN-POT CLAVULANATE 875-125 MG PO TABS: 1.0000 | ORAL_TABLET | Freq: Two times a day (BID) | ORAL | Status: DC

## 2013-12-21 NOTE — Progress Notes (Signed)
Pre visit review using our clinic review tool, if applicable. No additional management support is needed unless otherwise documented below in the visit note. 

## 2013-12-21 NOTE — Progress Notes (Signed)
Subjective:    Patient ID: Gloria Rush, female    DOB: [birth year], 38 y.o.   MRN: [id number]  HPI Here for sinus problems for a month  1 month of symptoms-not getting better  Also impetigo - using bactroban under her nose - and is getting better  Sore throat  Nasal congestion  Asthma acted up (just inc her medicines)  Facial pain under the eyes   Cannot get much mucous out of nose  Thinks it is stuck   Takes zyrtec Benadryl prn    Patient Active Problem List   Diagnosis Date Noted  . Lynch syndrome 04/13/2013  . Unspecified vitamin D deficiency 08/24/2012  . Major depressive disorder, recurrent, in full remission 12/18/2009  . ALLERGIC CONJUNCTIVITIS 12/23/2008  . DERMATITIS, ATOPIC 12/23/2008  . Panic disorder without agoraphobia with panic attacks in partial remission 01/23/2008  . GERD 12/05/2007  . ALLERGIC RHINITIS 12/27/2006  . Asthma, moderate persistent, well-controlled 12/27/2006   Past Medical History  Diagnosis Date  . Allergy   . Asthma   . Anxiety   . Lynch syndrome   . Panic disorder    No past surgical history on file. History  Substance Use Topics  . Smoking status: Never Smoker   . Smokeless tobacco: Never Used  . Alcohol Use: No   Family History  Problem Relation Age of Onset  . Anxiety disorder Mother   . Anxiety disorder Brother   . Diabetes Maternal Grandfather   . Breast cancer Maternal Grandmother 81  . Breast cancer Maternal Aunt 70   Allergies  Allergen Reactions  . Dexilant [Dexlansoprazole] Other (See Comments)    Throat feels tight   Current Outpatient Prescriptions on File Prior to Visit  Medication Sig Dispense Refill  . albuterol (PROVENTIL) (2.5 MG/3ML) 0.083% nebulizer solution Take 2.5 mg by nebulization every 6 (six) hours as needed.      . cetirizine (ZYRTEC) 10 MG tablet Take 10 mg by mouth daily.    Marland Kitchen escitalopram (LEXAPRO) 20 MG tablet TAKE 1/2 TABLET BY MOUTH DAILY. 30 tablet 2  . fluticasone-salmeterol  (ADVAIR HFA) 115-21 MCG/ACT inhaler Inhale 2 puffs into the lungs 2 (two) times daily. 1 Inhaler 12  . mupirocin ointment (BACTROBAN) 2 % Apply topically 3 (three) times daily. 22 g 2  . pantoprazole (PROTONIX) 40 MG tablet Take 1 tablet (40 mg total) by mouth daily. 30 tablet 11  . VENTOLIN HFA 108 (90 BASE) MCG/ACT inhaler USE 2 PUFFS EVERY 4 HOURS AS NEEDED FOR WHEEZING 18 each 3   No current facility-administered medications on file prior to visit.     Review of Systems Review of Systems  Constitutional: Negative for fever, appetite change, and unexpected weight change.  ENT pos for cong and facial pain  Eyes: Negative for pain and visual disturbance.  Respiratory: Negative for cough and shortness of breath.   Cardiovascular: Negative for cp or palpitations    Gastrointestinal: Negative for nausea, diarrhea and constipation.  Genitourinary: Negative for urgency and frequency.  Skin: Negative for pallor and pos for rash under nose (impetigo) that is much improved  Neurological: Negative for weakness, light-headedness, numbness and headaches.  Hematological: Negative for adenopathy. Does not bruise/bleed easily.  Psychiatric/Behavioral: Negative for dysphoric mood. The patient is not nervous/anxious.         Objective:   Physical Exam  Constitutional: She appears well-developed and well-nourished. No distress.  HENT:  Head: Normocephalic and atraumatic.  Right Ear: External ear normal.  Mouth/Throat:  Oropharynx is clear and moist.  Nares are injected and congested   bilat frontal and maxillary sinus tenderness  Eyes: Conjunctivae and EOM are normal. Pupils are equal, round, and reactive to light. Right eye exhibits no discharge. Left eye exhibits no discharge.  Neck: Normal range of motion. Neck supple.  Cardiovascular: Normal rate and regular rhythm.   Pulmonary/Chest: Effort normal. No respiratory distress. She has no wheezes. She has no rales.  Neurological: She is alert. No  cranial nerve deficit.  Skin: Skin is warm and dry. No rash noted. There is erythema.  Scant redness under nose where impetigo is healing   Psychiatric: She has a normal mood and affect.          Assessment & Plan:   Problem List Items Addressed This Visit      Respiratory   Acute sinusitis - Primary    Cover with augmentin  Disc symptomatic care - see instructions on AVS  Update if not starting to improve in a week or if worsening      Relevant Medications      amoxicillin-clavulanate (AUGMENTIN) tablet 875-125 mg    Other Visit Diagnoses    Need for prophylactic vaccination and inoculation against influenza        Relevant Orders       Flu vaccine, recombinant, trivalent, inj (Flublok egg free) (Completed)

## 2013-12-21 NOTE — Patient Instructions (Signed)
Take augmentin as directed for sinus infection  Drink lots of fluids Breathe steam / try nasal saline spray  Also warm compress on sinuses

## 2013-12-22 ENCOUNTER — Telehealth: Payer: Self-pay | Admitting: Family Medicine

## 2013-12-22 MED ORDER — AZITHROMYCIN 250 MG PO TABS: ORAL_TABLET | ORAL | Status: DC

## 2013-12-22 NOTE — Telephone Encounter (Signed)
Received call from nurse on call.  Pt was given rx for amoxicillin yesterday for sinusitis.  Last time she had it, caused itching.  Now having rapid heart rate and thinks it is from the amoxicillin and asking for [facility].  Likely anxiety related but given previous history of itching, will [facility] and add Amoxicillin to her allergy list.

## 2013-12-23 NOTE — Assessment & Plan Note (Signed)
Cover with augmentin  Disc symptomatic care - see instructions on AVS  Update if not starting to improve in a week or if worsening   

## 2013-12-24 ENCOUNTER — Telehealth: Payer: Self-pay

## 2013-12-24 NOTE — Telephone Encounter (Signed)
PLEASE NOTE: All timestamps contained within this report are represented as [place] Standard Time. CONFIDENTIALTY NOTICE: This fax transmission is intended only for the addressee. It contains information that is legally privileged, confidential or otherwise protected from use or disclosure. If you are not the intended recipient, you are strictly prohibited from reviewing, disclosing, copying using or disseminating any of this information or taking any action in reliance on or regarding this information. If you have received this fax in error, please notify us immediately by telephone so that we can arrange for its return to [place]. Phone: [phone], Toll-Free: [phone], Fax: [phone] Page: 1 of 1 Call Id: [id number] [place] Patient Name: Gloria Rush Gender: Female DOB: 24-Nov-1975 Age: 38 Y 23 M 21 D Return Phone Number: [phone] (Primary) Address: [address] City/State/Zip: [place] [address] Client [place] [facility] - Client Client Site [facility] [place] Contact Type Call Call Type Triage / Clinical Relationship To Patient Self Return Phone Number [phone] (Primary) Chief Complaint Prescription Refill or Medication Request (non symptomatic) Initial Comment Caller states that she was prescribed rx, now is jittery (amoxicillin), for sinus infection. Nurse Assessment Guidelines Guideline Title Affirmed Question Affirmed Notes Nurse Date/Time (Eastern Time) Disp. Time Eilene Ghazi Time) Disposition Final User 12/22/2013 6:18:16 PM FINAL ATTEMPT MADE - message left Yes Lovena Le RN, Truman Hayward After Care Instructions Given Call Event Type User Date / Time Description

## 2013-12-24 NOTE — Telephone Encounter (Signed)
Aware-it looks like Dr Deborra Medina changed her abx to zpack  Let us know if no improvement

## 2013-12-24 NOTE — Telephone Encounter (Signed)
PLEASE NOTE: All timestamps contained within this report are represented as [place] Standard Time. CONFIDENTIALTY NOTICE: This fax transmission is intended only for the addressee. It contains information that is legally privileged, confidential or otherwise protected from use or disclosure. If you are not the intended recipient, you are strictly prohibited from reviewing, disclosing, copying using or disseminating any of this information or taking any action in reliance on or regarding this information. If you have received this fax in error, please notify us immediately by telephone so that we can arrange for its return to [place]. Phone: [phone], Toll-Free: [phone], Fax: [phone] Page: 1 of 2 Call Id: [id number] [place] Patient Name: Gloria Rush Gender: Female DOB: [birth year] Age: 38 Y 12 M 21 D Return Phone Number: [phone] (Primary) Address: [address] City/State/Zip: [place] [address] Client [place] [facility] - Client Client Site [place] - Night Contact Type Call Call Type Call Back Waiting for Nurse Relationship To Patient Self Return Phone Number [phone] (Primary) Chief Complaint Chest Pain (non urgent symptoms) Initial Comment caller has intermittent chest tightness and rapid heart rate; NO currnet SXs; after starting Rx for sinus infection PreDisposition Call Doctor Nurse Assessment Nurse: Shawn Stall, RN, Trish Date/Time (Eastern Time): 12/22/2013 6:29:25 PM Confirm and document reason for call. If symptomatic, describe symptoms. ---Patient is calling for self and caller has intermittent chest tightness and rapid heart rate. She is on amoxicillin for sinus infection. Was prescribed by dr towers. Dr. Margaretmary Bayley is PCP. [phone] itching with amoxicillin Has the patient traveled out of the country within the  last 30 days? ---No Does the patient require triage? ---Yes Related visit to physician within the last 2 weeks? ---Yes Does the PT have any chronic conditions? (i.e. diabetes, asthma, etc.) ---Yes List chronic conditions. ---Asthma Did the patient indicate they were pregnant? ---No Guidelines Guideline Title Affirmed Question Affirmed Notes Nurse Date/Time Eilene Ghazi Time) Medication Question Call [1] Prescription not at pharmacy AND [2] was prescribed today by PCP Shawn Stall, RN, Trish 12/22/2013 6:45:09 PM Disp. Time Eilene Ghazi Time) Disposition Final User 12/22/2013 6:24:24 PM Send To Clinical Follow Up [place] 12/22/2013 6:47:04 PM Called On-Call Provider Shawn Stall, RN, Trish Reason: Called Dr. Marjory Lies and notified of patients complaints and she perscribed z-pac. Patient aware. 12/22/2013 6:45:57 PM Call PCP Now Yes Shawn Stall, RN, Trish PLEASE NOTE: All timestamps contained within this report are represented as [place] Standard Time. CONFIDENTIALTY NOTICE: This fax transmission is intended only for the addressee. It contains information that is legally privileged, confidential or otherwise protected from use or disclosure. If you are not the intended recipient, you are strictly prohibited from reviewing, disclosing, copying using or disseminating any of this information or taking any action in reliance on or regarding this information. If you have received this fax in error, please notify us immediately by telephone so that we can arrange for its return to [place]. Phone: [phone], Toll-Free: [phone], Fax: [phone] Page: 2 of 2 Call Id: [id number] Caller Understands: Yes Disagree/Comply: Comply Care Advice Given Per Guideline CALL PCP NOW: You need to discuss this with your doctor. I'll page him now. If you haven't heard from the on-call doctor within 30 minutes, call again. CARE ADVICE given per Medication Question Call (Adult) guideline. After Care Instructions  Given Call Event Type User Date / Time Description Referrals REFERRED TO PCP OFFICE

## 2013-12-27 ENCOUNTER — Emergency Department: Payer: Self-pay | Admitting: Emergency Medicine

## 2013-12-27 LAB — CBC
HCT: 43.1 % (ref 35.0–47.0)
HGB: 13.9 g/dL (ref 12.0–16.0)
MCH: 28.6 pg (ref 26.0–34.0)
MCHC: 32.2 g/dL (ref 32.0–36.0)
MCV: 89 fL (ref 80–100)
PLATELETS: 187 10*3/uL (ref 150–440)
RBC: 4.86 10*6/uL (ref 3.80–5.20)
RDW: 13.7 % (ref 11.5–14.5)
WBC: 8.8 10*3/uL (ref 3.6–11.0)

## 2013-12-27 LAB — BASIC METABOLIC PANEL
Anion Gap: 11 (ref 7–16)
BUN: 8 mg/dL (ref 7–18)
CALCIUM: 8.7 mg/dL (ref 8.5–10.1)
CHLORIDE: 107 mmol/L (ref 98–107)
CO2: 20 mmol/L — AB (ref 21–32)
Creatinine: 0.91 mg/dL (ref 0.60–1.30)
EGFR (African American): >60
EGFR (Non-African Amer.): >60
Glucose: 108 mg/dL — ABNORMAL HIGH (ref 65–99)
Osmolality: 275 (ref 275–301)
Potassium: 3.2 mmol/L — ABNORMAL LOW (ref 3.5–5.1)
Sodium: 138 mmol/L (ref 136–145)

## 2013-12-27 LAB — TROPONIN I: Troponin-I: <0.02 ng/mL

## 2013-12-28 ENCOUNTER — Telehealth: Payer: Self-pay | Admitting: Internal Medicine

## 2013-12-28 NOTE — Telephone Encounter (Signed)
Opened in error

## 2013-12-28 NOTE — Telephone Encounter (Signed)
FYI. (late entry).  Pt called 12/27/13 (1900) with concerns regarding "lung pain".  Nurse reports some sob.  Increased pain with no relief.  Recommend evaluation now.    Einar Pheasant

## 2013-12-28 NOTE — Telephone Encounter (Signed)
PLEASE NOTE: All timestamps contained within this report are represented as [place] Standard Time. CONFIDENTIALTY NOTICE: This fax transmission is intended only for the addressee. It contains information that is legally privileged, confidential or otherwise protected from use or disclosure. If you are not the intended recipient, you are strictly prohibited from reviewing, disclosing, copying using or disseminating any of this information or taking any action in reliance on or regarding this information. If you have received this fax in error, please notify us immediately by telephone so that we can arrange for its return to [place]. Phone: [phone], Toll-Free: [phone], Fax: [phone] Page: 1 of 2 Call Id: [id number] [place] Patient Name: Gloria Rush Gender: Female DOB: April 23, 1975 Age: 38 Y 74 M 27 D Return Phone Number: [phone] (Primary) Address: [address] City/State/Zip: [place] [address] Client [place] Night - Client Client Site [facility] [place] [place] Contact Type Call Call Type Triage / Clinical Relationship To Patient Self Return Phone Number [phone] (Primary) Chief Complaint Flank Pain Initial Comment Caller states the are having lung pain, on the sides. PreDisposition InappropriateToAsk Nurse Assessment Nurse: Dimas Chyle, RN, Levada Dy Date/Time Eilene Ghazi Time): 12/27/2013 6:50:26 PM Confirm and document reason for call. If symptomatic, describe symptoms. ---Caller states the are having lung pain, on the sides. Started this afternoon. Peak was 450-500. On an antibiotic for sinus infection and just took the last one yesterday. Has the patient traveled out of the country within the last 30 days? ---Not Applicable Does the patient require triage? ---Yes Related visit to physician within the last  2 weeks? ---No Does the PT have any chronic conditions? (i.e. diabetes, asthma, etc.) ---Yes List chronic conditions. ---aSTHMA Did the patient indicate they were pregnant? ---No Guidelines Guideline Title Affirmed Question Affirmed Notes Nurse Date/Time (Eastern Time) Chest Pain Patient sounds very sick or weak to the triager Dimas Chyle, RN, Levada Dy 12/27/2013 6:53:31 PM Disp. Time Eilene Ghazi Time) Disposition Final User 12/27/2013 6:44:07 PM Attempt made - message left Gaylyn Cheers 12/27/2013 7:01:15 PM Paged On Call back to [facility], RN, Levada Dy Reason: Dr. Nicki Reaper 12/27/2013 8:14:10 PM Call Completed Dimas Chyle, RN, Levada Dy 12/27/2013 6:55:41 PM Go to ED Now (or PCP triage) Yes Dimas Chyle, RN, Levada Dy PLEASE NOTE: All timestamps contained within this report are represented as [place] Standard Time. CONFIDENTIALTY NOTICE: This fax transmission is intended only for the addressee. It contains information that is legally privileged, confidential or otherwise protected from use or disclosure. If you are not the intended recipient, you are strictly prohibited from reviewing, disclosing, copying using or disseminating any of this information or taking any action in reliance on or regarding this information. If you have received this fax in error, please notify us immediately by telephone so that we can arrange for its return to [place]. Phone: [phone], Toll-Free: [phone], Fax: [phone] Page: 2 of 2 Call Id: [id number] [place] Understands: Yes Disagree/Comply: Comply Care Advice Given Per Guideline GO TO ED NOW (OR PCP TRIAGE): * IF NO PCP TRIAGE: You need to be seen. Go to the [facility] at _[facility] Hospital within the next hour. Leave as soon as you can. DRIVING: Another adult should drive. BRING MEDICINES: * Please bring a list of your current medicines when you go to see the doctor. CARE ADVICE given per Chest Pain (Adult) guideline. After Care Instructions Given Call Event  Type User Date / Time Description Comments  User: Dorann Lodge, RN Date/Time Eilene Ghazi Time): 12/27/2013 7:02:25 PM Spoke with Dr. Nicki Reaper and she agreed with the triage outcome and instructed patient to go to the ER.

## 2013-12-28 NOTE — Telephone Encounter (Signed)
Noted  

## 2014-02-22 ENCOUNTER — Encounter: Payer: Self-pay | Admitting: Family Medicine

## 2014-02-22 ENCOUNTER — Ambulatory Visit (INDEPENDENT_AMBULATORY_CARE_PROVIDER_SITE_OTHER): Payer: BLUE CROSS/BLUE SHIELD | Admitting: Family Medicine

## 2014-02-22 VITALS — BP 106/69 | HR 81 | Temp 98.3°F | Ht 65.0 in | Wt 132.2 lb

## 2014-02-22 DIAGNOSIS — J029 Acute pharyngitis, unspecified: Secondary | ICD-10-CM

## 2014-02-22 LAB — POCT RAPID STREP A (OFFICE): RAPID STREP A SCREEN: NEGATIVE

## 2014-02-22 NOTE — Assessment & Plan Note (Signed)
Neg rapid strep, most likely viral pharyngiotis, but pt wishes to eval with culture given false neg rapid strep in past.

## 2014-02-22 NOTE — Progress Notes (Signed)
Pre visit review using our clinic review tool, if applicable. No additional management support is needed unless otherwise documented below in the visit note. 

## 2014-02-22 NOTE — Patient Instructions (Signed)
Rest, fluids.  Ambesol on ulcer. Tylenol and ibuprofen for fever, sore throat.  We will call with culture results early next week.

## 2014-02-22 NOTE — Progress Notes (Signed)
   Subjective:    Patient ID: Gloria Rush, female    DOB: 11-15-1975, 39 y.o.   MRN: [id number]  Sore Throat  This is a new problem. The current episode started in the past 7 days. The problem has been gradually worsening. The maximum temperature recorded prior to her arrival was 100.4 - 100.9 F. The fever has been present for 3 to 4 days. The pain is moderate. Associated symptoms include a plugged ear sensation, swollen glands and trouble swallowing. Pertinent negatives include no abdominal pain, ear discharge or shortness of breath. She has had no exposure to strep or mono. She has tried acetaminophen (dayquil) for the symptoms. The treatment provided mild relief.  Cough The cough is non-productive. Pertinent negatives include no chest pain, fever or shortness of breath. Risk factors: nonsmoker. Her past medical history is significant for asthma. There is no history of COPD or environmental allergies.    Sore on right inner cheek.  Review of Systems  Constitutional: Negative for fever and fatigue.  HENT: Positive for trouble swallowing. Negative for ear discharge.   Eyes: Negative for pain.  Respiratory: Negative for chest tightness and shortness of breath.   Cardiovascular: Negative for chest pain, palpitations and leg swelling.  Gastrointestinal: Negative for abdominal pain.  Genitourinary: Negative for dysuria.  Allergic/Immunologic: Negative for environmental allergies.       Objective:   Physical Exam  Constitutional: Vital signs are normal. She appears well-developed and well-nourished. She is cooperative.  Non-toxic appearance. She does not appear ill. No distress.  HENT:  Head: Normocephalic.  Right Ear: Hearing, tympanic membrane, external ear and ear canal normal. Tympanic membrane is not erythematous, not retracted and not bulging.  Left Ear: Hearing, tympanic membrane, external ear and ear canal normal. Tympanic membrane is not erythematous, not retracted and not  bulging.  Nose: No mucosal edema or rhinorrhea. Right sinus exhibits no maxillary sinus tenderness and no frontal sinus tenderness. Left sinus exhibits no maxillary sinus tenderness and no frontal sinus tenderness.  Mouth/Throat: Uvula is midline and mucous membranes are normal. No uvula swelling. Posterior oropharyngeal erythema present. No oropharyngeal exudate, posterior oropharyngeal edema or tonsillar abscesses.  1 cm aphthous ulcer on right buccal mucosa  Eyes: Conjunctivae, EOM and lids are normal. Pupils are equal, round, and reactive to light. Lids are everted and swept, no foreign bodies found.  Neck: Trachea normal and normal range of motion. Neck supple. Carotid bruit is not present. No thyroid mass and no thyromegaly present.  Cardiovascular: Normal rate, regular rhythm, S1 normal, S2 normal, normal heart sounds, intact distal pulses and normal pulses.  Exam reveals no gallop and no friction rub.   No murmur heard. Pulmonary/Chest: Effort normal and breath sounds normal. No tachypnea. No respiratory distress. She has no decreased breath sounds. She has no wheezes. She has no rhonchi. She has no rales.  Abdominal: Soft. Normal appearance and bowel sounds are normal. There is no tenderness.  Neurological: She is alert.  Skin: Skin is warm, dry and intact. No rash noted.  Psychiatric: Her speech is normal and behavior is normal. Judgment and thought content normal. Her mood appears not anxious. Cognition and memory are normal. She does not exhibit a depressed mood.          Assessment & Plan:

## 2014-02-22 NOTE — Addendum Note (Signed)
Addended by: Ellamae Sia on: 02/22/2014 05:33 PM   Modules accepted: Orders

## 2014-02-23 ENCOUNTER — Telehealth: Payer: Self-pay | Admitting: Family Medicine

## 2014-02-24 LAB — CULTURE, GROUP A STREP: Organism ID, Bacteria: NORMAL

## 2014-02-25 NOTE — Telephone Encounter (Signed)
Please schedule CPE with fasting labs prior for Dr. Lorelei Pont sometime in the next three months.

## 2014-02-26 ENCOUNTER — Telehealth: Payer: Self-pay | Admitting: Family Medicine

## 2014-02-26 NOTE — Telephone Encounter (Signed)
Lab 4/1 cpx 4/6 Pt aware Please close

## 2014-02-26 NOTE — Telephone Encounter (Signed)
Notified pt of negative strep culture.  Pt still not feeling well at all.  Very congested and feels it may have turned into a sinus infection.  She wants to know how long should she expect to not feel good if this is just a virus.  She is trying to figure out when/if she needs to be seen again.

## 2014-02-26 NOTE — Telephone Encounter (Signed)
Expect ST and any fever to improve in first 3-4 days, but congestion and cough will worsen and last until day 7-10, If at that point getting worse not better each day, call to update [place].

## 2014-02-26 NOTE — Telephone Encounter (Signed)
Luana notified as instructed by telephone.  

## 2014-03-08 ENCOUNTER — Other Ambulatory Visit: Payer: Self-pay | Admitting: Family Medicine

## 2014-03-08 ENCOUNTER — Encounter: Payer: Self-pay | Admitting: Family Medicine

## 2014-03-08 MED ORDER — CLARITHROMYCIN 500 MG PO TABS: 500.0000 mg | ORAL_TABLET | Freq: Two times a day (BID) | ORAL | Status: DC

## 2014-04-11 NOTE — Telephone Encounter (Signed)
Pt called to cancel appointment.  She stated she gets her cpx done at her obgyn

## 2014-04-12 ENCOUNTER — Other Ambulatory Visit: Payer: BLUE CROSS/BLUE SHIELD

## 2014-04-17 ENCOUNTER — Encounter: Payer: BLUE CROSS/BLUE SHIELD | Admitting: Family Medicine

## 2014-04-19 ENCOUNTER — Other Ambulatory Visit: Payer: BLUE CROSS/BLUE SHIELD

## 2014-06-27 ENCOUNTER — Other Ambulatory Visit: Payer: Self-pay | Admitting: Family Medicine

## 2014-06-27 ENCOUNTER — Telehealth: Payer: Self-pay

## 2014-06-27 ENCOUNTER — Telehealth: Payer: Self-pay | Admitting: Family Medicine

## 2014-06-27 NOTE — Telephone Encounter (Signed)
Left v/m since end of day that refill was done as requested.

## 2014-06-27 NOTE — Telephone Encounter (Signed)
[name] called to let you know that finally adopted a baby  Baby boy Gloria Rush 4/7  [email or web address]   She would like to bring they baby by so you can see him.

## 2014-06-27 NOTE — Telephone Encounter (Signed)
Pt wants to know if has samples of advair inhaler; today is last day pt will have ins; pt will start new ins on 07/02/14. Advised pt [facility] no longer has samples of any meds. Pt will get refill done today then.

## 2014-06-27 NOTE — Telephone Encounter (Signed)
Pt request ventolin inhaler to [facility]; pt said only has 3 puffs left in inhaler that she has and today is last day of ins. Please advise.last CPX 02/07/2013.pt cancelled 04/2014 CPX.Please advise.

## 2014-07-24 NOTE — Telephone Encounter (Signed)
Spoke to [place], and she is doing very well with her new baby.   Wants to bring him by the office.   08/15/2014? Thursday PM appt?

## 2014-07-30 NOTE — Telephone Encounter (Signed)
Gloria Rush, would you mind calling [place] or TJ Cashwell. We talked last week, and they wanted to bring their new baby by to visit, but I thought it would be better if it was in the evening to avoid germs.   I think I am not working late until. 08/29/2014  Can you block a 15 min OV with me - something like "no charge social baby visit' 6:15 PM would be good, but if she wanted to do a little earlier, that is fine, too.  Thanks!

## 2014-08-05 NOTE — Telephone Encounter (Signed)
Brandee called and said family will be on vacation 08/29/14.  She scheduled appointment on 08/15/14 at 3:45.  Mr.Sykora will check in and wait for Butch Penny and when Butch Penny comes out Shanisha will bring the baby in to the office.

## 2014-08-05 NOTE — Telephone Encounter (Signed)
ok 

## 2014-08-13 ENCOUNTER — Telehealth: Payer: Self-pay

## 2014-08-13 NOTE — Telephone Encounter (Signed)
Pt left v/m; pt has changed prescription plans and advair cost to pt is $ 300.00. Spoke with pt and she will contact ins co to see what substitute med is covered under her plan and will cb with info at later time.[facility].

## 2014-08-22 ENCOUNTER — Encounter: Payer: Self-pay | Admitting: Family Medicine

## 2014-09-17 ENCOUNTER — Other Ambulatory Visit: Payer: Self-pay | Admitting: Family Medicine

## 2014-10-31 ENCOUNTER — Encounter: Payer: Self-pay | Admitting: Family Medicine

## 2014-11-01 ENCOUNTER — Encounter: Payer: Self-pay | Admitting: Family Medicine

## 2014-11-01 ENCOUNTER — Other Ambulatory Visit: Payer: Self-pay | Admitting: Family Medicine

## 2014-11-01 MED ORDER — MOMETASONE FUROATE 200 MCG/ACT IN AERO: 2.0000 | INHALATION_SPRAY | Freq: Two times a day (BID) | RESPIRATORY_TRACT | Status: DC

## 2014-12-13 ENCOUNTER — Other Ambulatory Visit: Payer: Self-pay | Admitting: Family Medicine

## 2014-12-13 NOTE — Telephone Encounter (Signed)
Last office visit 02/22/2014 with Dr. Diona Browner.  Last refilled 02/25/2014 for #30 with 2 refills.  Ok to refill?

## 2014-12-13 NOTE — Telephone Encounter (Signed)
See Dr. Lillie Fragmin note below.  Please call and schedule follow up appointment.

## 2014-12-13 NOTE — Telephone Encounter (Signed)
Ok to refill 30, 1 refill  She cancelled her CPX with me. Please have her set up a regular f/u for depression and asthma - i haven't seen her in more than a year

## 2015-01-20 ENCOUNTER — Ambulatory Visit: Payer: BLUE CROSS/BLUE SHIELD | Admitting: Family Medicine

## 2015-01-27 ENCOUNTER — Ambulatory Visit: Payer: BLUE CROSS/BLUE SHIELD | Admitting: Family Medicine

## 2015-01-30 ENCOUNTER — Ambulatory Visit: Payer: BLUE CROSS/BLUE SHIELD | Admitting: Family Medicine

## 2015-02-21 ENCOUNTER — Ambulatory Visit (INDEPENDENT_AMBULATORY_CARE_PROVIDER_SITE_OTHER): Payer: BLUE CROSS/BLUE SHIELD | Admitting: Family Medicine

## 2015-02-21 ENCOUNTER — Encounter: Payer: Self-pay | Admitting: Family Medicine

## 2015-02-21 VITALS — BP 100/80 | HR 117 | Temp 99.4°F | Ht 65.0 in | Wt 122.8 lb

## 2015-02-21 DIAGNOSIS — J029 Acute pharyngitis, unspecified: Secondary | ICD-10-CM | POA: Diagnosis not present

## 2015-02-21 DIAGNOSIS — J4540 Moderate persistent asthma, uncomplicated: Secondary | ICD-10-CM

## 2015-02-21 DIAGNOSIS — R509 Fever, unspecified: Secondary | ICD-10-CM

## 2015-02-21 LAB — POCT INFLUENZA A/B
Influenza A, POC: NEGATIVE
Influenza B, POC: NEGATIVE

## 2015-02-21 LAB — POCT RAPID STREP A (OFFICE): Rapid Strep A Screen: POSITIVE — AB

## 2015-02-21 MED ORDER — AZITHROMYCIN 250 MG PO TABS: ORAL_TABLET | ORAL | Status: DC

## 2015-02-21 NOTE — Progress Notes (Signed)
Pre visit review using our clinic review tool, if applicable. No additional management support is needed unless otherwise documented below in the visit note. 

## 2015-02-21 NOTE — Assessment & Plan Note (Signed)
Strep positive. Atypical symtpoms, possible false positive or carrier but pt with severe sore throat.  Will treat with azithromycin given amox allergyu

## 2015-02-21 NOTE — Patient Instructions (Addendum)
Push fluids.  Ibuprofen 800 mg every 8 hours for sore throat. Complete course of antibiotics.  Call if fever on antibiotics or unable to swallow. Continue using ventolin as needed for wheeze, but no clear current asthma exacerbation.

## 2015-02-21 NOTE — Progress Notes (Signed)
   Subjective:    Patient ID: Gloria Rush, female    DOB: October 03, 1975, 40 y.o.   MRN: [id number]  Cough This is a new problem. The current episode started in the past 7 days (productive cough, now in last 24 hours sor throat has become severe.). Associated symptoms include ear congestion, ear pain, a fever, myalgias, nasal congestion, postnasal drip, shortness of breath and wheezing. The symptoms are aggravated by lying down. Risk factors: nonsmoker. She has tried a beta-agonist inhaler (benadryl) for the symptoms. The treatment provided mild relief. Her past medical history is significant for asthma. There is no history of COPD, environmental allergies or pneumonia. moderate persitant asthma.  Wheezing  Associated symptoms include coughing, ear pain, a fever and shortness of breath. Her past medical history is significant for asthma. There is no history of COPD or pneumonia. moderate persitant asthma.  Sore Throat  This is a new problem. Associated symptoms include coughing, ear pain and shortness of breath.  Fever  The current episode started yesterday. The maximum temperature noted was 99 to 99.9 F. Associated symptoms include coughing, ear pain and wheezing. She has tried NSAIDs for the symptoms. The treatment provided moderate relief.    Sick contacts : husband with viral bronchitis , child ill as well. Virus, ear infection.   Review of Systems  Constitutional: Positive for fever.  HENT: Positive for ear pain and postnasal drip.   Respiratory: Positive for cough, shortness of breath and wheezing.   Musculoskeletal: Positive for myalgias.  Allergic/Immunologic: Negative for environmental allergies.       Objective:   Physical Exam  Constitutional: Vital signs are normal. She appears well-developed and well-nourished. She is cooperative.  Non-toxic appearance. She does not appear ill. No distress.  HENT:  Head: Normocephalic.  Right Ear: Hearing, external ear and ear canal normal.  Tympanic membrane is not erythematous, not retracted and not bulging. A middle ear effusion is present.  Left Ear: Hearing, external ear and ear canal normal. Tympanic membrane is not erythematous, not retracted and not bulging. A middle ear effusion is present.  Nose: Mucosal edema and rhinorrhea present. Right sinus exhibits no maxillary sinus tenderness and no frontal sinus tenderness. Left sinus exhibits no maxillary sinus tenderness and no frontal sinus tenderness.  Mouth/Throat: Uvula is midline and mucous membranes are normal. Posterior oropharyngeal edema and posterior oropharyngeal erythema present. No oropharyngeal exudate.  Eyes: Conjunctivae, EOM and lids are normal. Pupils are equal, round, and reactive to light. Lids are everted and swept, no foreign bodies found.  Neck: Trachea normal and normal range of motion. Neck supple. Carotid bruit is not present. No thyroid mass and no thyromegaly present.  Cardiovascular: Normal rate, regular rhythm, S1 normal, S2 normal, normal heart sounds, intact distal pulses and normal pulses.  Exam reveals no gallop and no friction rub.   No murmur heard. Pulmonary/Chest: Effort normal and breath sounds normal. No tachypnea. No respiratory distress. She has no decreased breath sounds. She has no wheezes. She has no rhonchi. She has no rales.  Neurological: She is alert.  Skin: Skin is warm, dry and intact. No rash noted.  Psychiatric: Her speech is normal and behavior is normal. Judgment normal. Her mood appears not anxious. Cognition and memory are normal. She does not exhibit a depressed mood.          Assessment & Plan:

## 2015-02-21 NOTE — Assessment & Plan Note (Signed)
No clear current asthma falre. Using albuterol as needed.

## 2015-02-21 NOTE — Addendum Note (Signed)
Addended by: Carter Kitten on: 02/21/2015 03:33 PM   Modules accepted: Orders

## 2015-05-06 ENCOUNTER — Encounter: Payer: Self-pay | Admitting: Family Medicine

## 2015-05-12 ENCOUNTER — Other Ambulatory Visit: Payer: Self-pay | Admitting: Family Medicine

## 2015-05-12 MED ORDER — LEVOCETIRIZINE DIHYDROCHLORIDE 5 MG PO TABS: ORAL_TABLET | ORAL | Status: DC

## 2015-05-12 NOTE — Telephone Encounter (Signed)
Last office visit 02/21/15 with Dr. Diona Browner.  Not on current medication list.  Refill?

## 2015-05-12 NOTE — Addendum Note (Signed)
Addended by: Carter Kitten on: 05/12/2015 04:06 PM   Modules accepted: Orders

## 2015-05-13 ENCOUNTER — Other Ambulatory Visit: Payer: Self-pay | Admitting: Family Medicine

## 2015-05-15 ENCOUNTER — Other Ambulatory Visit: Payer: Self-pay | Admitting: Family Medicine

## 2015-05-16 NOTE — Telephone Encounter (Signed)
Last office visit 02/21/2015 with Allie Bossier.  Ok to refill?

## 2015-06-13 ENCOUNTER — Ambulatory Visit: Payer: BLUE CROSS/BLUE SHIELD | Admitting: Family Medicine

## 2015-06-13 ENCOUNTER — Ambulatory Visit (INDEPENDENT_AMBULATORY_CARE_PROVIDER_SITE_OTHER): Payer: BLUE CROSS/BLUE SHIELD | Admitting: Family Medicine

## 2015-06-13 ENCOUNTER — Encounter: Payer: Self-pay | Admitting: Family Medicine

## 2015-06-13 VITALS — BP 108/70 | HR 88 | Temp 98.3°F | Wt 123.8 lb

## 2015-06-13 DIAGNOSIS — R0789 Other chest pain: Secondary | ICD-10-CM

## 2015-06-13 NOTE — Patient Instructions (Addendum)
I think this is more anxiety but start ranitidine 150mg  nightly for 1 week then as needed. Ok to continue benadryl 1/2 tablet as needed. Avoidance of citrus, fatty foods, chocolate, peppermint, and excessive alcohol, along with sodas, orange juice (acidic drinks) At least a few hours between dinner and bed, minimize naps after eating. Let us know if not improving with this.

## 2015-06-13 NOTE — Assessment & Plan Note (Addendum)
Anticipate these episodes are anxiety related given sxs endorsed and improved with benadryl, less likely GERD related. Discussed treatment with ranitidine nightly x 1 wk then PRN, ok to continue benadryl PRN, discussed GERD precautions.  Update if not better with treatment.  Asthma flare effectively r/o given normal peak flows during flares.

## 2015-06-13 NOTE — Progress Notes (Signed)
BP 108/70 mmHg  Pulse 88  Temp(Src) 98.3 F (36.8 C) (Oral)  Wt 123 lb 12 oz (56.133 kg)  LMP 05/12/2015   CC: GERD?  Subjective:    Patient ID: Gloria Rush, female    DOB: 09-07-1975, 40 y.o.   MRN: [id number]  HPI: Gloria Rush is a 40 y.o. female presenting on 06/13/2015 for Gastroesophageal Reflux   Ongoing episodes of chest tightness, dyspnea, burping happening once a week usually after she eats a meal. Benadryl helps - without significant sedation. Using albuterol about twice weekly. Known anxiety. She takes lexapro 10mg  daily - this is improving. Recent adoption 04/2014.   Known asthmatic - checks PF 450 normal, O2 sats normal. Never wheezing.   Stopped protonix last Spring due to cost. This wasn't really helping control GERD symptoms - ongoing burping and chest burning/tightness.   She has been taking tums. She does have allergen testing confirmed chicken allergy - presenting with throat tightness and worsening GERD. She was tested for other common food allergies and all other ones came back normal.   Relevant past medical, surgical, family and social history reviewed and updated as indicated. Interim medical history since our last visit reviewed. Allergies and medications reviewed and updated. Current Outpatient Prescriptions on File Prior to Visit  Medication Sig  . escitalopram (LEXAPRO) 20 MG tablet TAKE 1/2 TABLET BY MOUTH DAILY.  Marland Kitchen Mometasone Furoate (ASMANEX HFA) 200 MCG/ACT AERO Inhale 2 puffs into the lungs 2 (two) times daily.  . mupirocin ointment (BACTROBAN) 2 % Apply topically 3 (three) times daily.  . VENTOLIN HFA 108 (90 BASE) MCG/ACT inhaler USE 2 PUFFS EVERY 4 HOURS AS NEEDED FOR WHEEZING   No current facility-administered medications on file prior to visit.    Review of Systems Per HPI unless specifically indicated in ROS section     Objective:    BP 108/70 mmHg  Pulse 88  Temp(Src) 98.3 F (36.8 C) (Oral)  Wt 123 lb 12 oz (56.133 kg)   LMP 05/12/2015  Wt Readings from Last 3 Encounters:  06/13/15 123 lb 12 oz (56.133 kg)  02/21/15 122 lb 12 oz (55.679 kg)  02/22/14 132 lb 4 oz (59.988 kg)    Physical Exam  Constitutional: She appears well-developed and well-nourished. No distress.  HENT:  Mouth/Throat: Oropharynx is clear and moist. No oropharyngeal exudate.  Cardiovascular: Normal rate, regular rhythm, normal heart sounds and intact distal pulses.   No murmur heard. Pulmonary/Chest: Effort normal and breath sounds normal. No respiratory distress. She has no wheezes. She has no rales.  Abdominal: Soft. Bowel sounds are normal. She exhibits no distension and no mass. There is no tenderness. There is no rebound and no guarding.  Psychiatric: Her speech is normal and behavior is normal. Judgment and thought content normal. Her mood appears anxious. Cognition and memory are normal.  Nursing note and vitals reviewed.     Assessment & Plan:   Problem List Items Addressed This Visit    Chest tightness - Primary    Anticipate these episodes are anxiety related given sxs endorsed and improved with benadryl, less likely GERD related. Discussed treatment with ranitidine nightly x 1 wk then PRN, ok to continue benadryl PRN, discussed GERD precautions.  Update if not better with treatment.  Asthma flare effectively r/o given normal peak flows during flares.           Follow up plan: Return if symptoms worsen or fail to improve.  Ria Bush, MD

## 2015-06-13 NOTE — Progress Notes (Signed)
Pre visit review using our clinic review tool, if applicable. No additional management support is needed unless otherwise documented below in the visit note. 

## 2015-06-23 ENCOUNTER — Encounter: Payer: Self-pay | Admitting: Family Medicine

## 2015-08-12 ENCOUNTER — Encounter: Payer: Self-pay | Admitting: Family Medicine

## 2015-08-14 MED ORDER — FLUTICASONE PROPIONATE HFA 110 MCG/ACT IN AERO: 2.0000 | INHALATION_SPRAY | Freq: Two times a day (BID) | RESPIRATORY_TRACT | 12 refills | Status: DC

## 2015-09-02 ENCOUNTER — Other Ambulatory Visit: Payer: Self-pay | Admitting: Family Medicine

## 2015-09-04 ENCOUNTER — Ambulatory Visit (INDEPENDENT_AMBULATORY_CARE_PROVIDER_SITE_OTHER): Payer: BLUE CROSS/BLUE SHIELD | Admitting: Family Medicine

## 2015-09-04 ENCOUNTER — Encounter: Payer: Self-pay | Admitting: Family Medicine

## 2015-09-04 VITALS — BP 98/60 | HR 64 | Temp 98.2°F | Wt 126.0 lb

## 2015-09-04 DIAGNOSIS — J301 Allergic rhinitis due to pollen: Secondary | ICD-10-CM

## 2015-09-04 DIAGNOSIS — H1013 Acute atopic conjunctivitis, bilateral: Secondary | ICD-10-CM | POA: Diagnosis not present

## 2015-09-04 DIAGNOSIS — Z1509 Genetic susceptibility to other malignant neoplasm: Secondary | ICD-10-CM | POA: Diagnosis not present

## 2015-09-04 NOTE — Patient Instructions (Addendum)
Can use a lubricating eye drop in addition to the one you are using  Add back your antihistamine   Allergic Conjunctivitis Allergic conjunctivitis is inflammation of the clear membrane that covers the white part of your eye and the inner surface of your eyelid (conjunctiva), and it is caused by allergies. The blood vessels in the conjunctiva become inflamed, and this causes the eye to become red or pink, and it often causes itchiness in the eye. Allergic conjunctivitis cannot be spread by one person to another person (noncontagious). CAUSES This condition is caused by an allergic reaction. Common causes of an allergic reaction (allergens) include:  Dust.  Pollen.  Mold.  Animal dander or secretions. RISK FACTORS This condition is more likely to develop if you are exposed to high levels of allergens that cause the allergic reaction. This might include being outdoors when air pollen levels are high or being around animals that you are allergic to. SYMPTOMS Symptoms of this condition may include:  Eye redness.  Tearing of the eyes.  Watery eyes.  Itchy eyes.  Burning feeling in the eyes.  Clear drainage from the eyes.  Swollen eyelids. DIAGNOSIS This condition may be diagnosed by medical history and physical exam. If you have drainage from your eyes, it may be tested to rule out other causes of conjunctivitis. TREATMENT Treatment for this condition often includes medicines. These may be eye drops, ointments, or oral medicines. They may be prescription medicines or over-the-counter medicines. HOME CARE INSTRUCTIONS  Take or apply medicines only as directed by your health care provider.  Do not touch or rub your eyes.  Do not wear contact lenses until the inflammation is gone. Wear glasses instead.  Do not wear eye makeup until the inflammation is gone.  Apply a cool, clean washcloth to your eye for 10-20 minutes, 3-4 times a day.  Try to avoid whatever allergen is causing  the allergic reaction. SEEK MEDICAL CARE IF:  Your symptoms get worse.  You have pus draining from your eye.  You have new symptoms.  You have a fever.   This information is not intended to replace advice given to you by your health care provider. Make sure you discuss any questions you have with your health care provider.   Document Released: 03/20/2002 Document Revised: 01/18/2014 Document Reviewed: 10/09/2013 Elsevier Interactive Patient Education [employer].

## 2015-09-04 NOTE — Progress Notes (Signed)
Pre visit review using our clinic review tool, if applicable. No additional management support is needed unless otherwise documented below in the visit note. 

## 2015-09-04 NOTE — Progress Notes (Signed)
   Subjective:    Patient ID: Gloria Rush, female    DOB: 03/23/1975, 41 y.o.   MRN: [id number]  HPI This is a 40 yo female who is accompanied by her 16 mo baby. She presents today with burning of both eyes for 1-2 days. Had a little crusting of right eye this morning. No drainage. No foreign body sensation. No fever/chills. She is using a homeopathic antihistamine eye drop for symptoms with some relief. She has had recent eye exam which was normal. She has had some post nasal drainage. She has known allergy to rag weed. Not currently taking antihistamine. Spends time out of doors daily. She is travelling on vacation soon and is concerned about being contagious.   Has Lynch syndrome. Sees gyn. Reports she has seen GI in the past and ColoGuard testing was recommended. She has high deductible insurance and states the test was unaffordable.   Has history of asthma, anxiety and GERD. She reports breathing and anxiety much improved on current regimen of Flovent and zantac.   Past Medical History:  Diagnosis Date  . Allergy   . Anxiety   . Asthma   . Lynch syndrome   . Panic disorder    No past surgical history on file. Family History  Problem Relation Age of Onset  . Anxiety disorder Mother   . Anxiety disorder Brother   . Diabetes Maternal Grandfather   . Breast cancer Maternal Grandmother 61  . Breast cancer Maternal Aunt 70   Social History  Substance Use Topics  . Smoking status: Never Smoker  . Smokeless tobacco: Never Used  . Alcohol use No      Review of Systems Per HPI    Objective:   Physical Exam  Constitutional: She is oriented to person, place, and time. She appears well-developed and well-nourished. No distress.  HENT:  Head: Normocephalic and atraumatic.  Post nasal drainage noted.    Eyes: Conjunctivae are normal. Pupils are equal, round, and reactive to light. Right eye exhibits no discharge. Left eye exhibits no discharge. No scleral icterus.    Cardiovascular: Normal rate.   Pulmonary/Chest: Effort normal.  Neurological: She is alert and oriented to person, place, and time.  Skin: Skin is warm and dry. She is not diaphoretic.  Psychiatric: She has a normal mood and affect. Her behavior is normal. Judgment and thought content normal.  Vitals reviewed.   BP 98/60   Pulse 64   Temp 98.2 F (36.8 C)   Wt 126 lb (57.2 kg)   LMP 09/04/2015 Comment: pt is currently menstruating  SpO2 98%   BMI 20.97 kg/m  Wt Readings from Last 3 Encounters:  09/04/15 126 lb (57.2 kg)  06/13/15 123 lb 12 oz (56.1 kg)  02/21/15 122 lb 12 oz (55.7 kg)  .   Assessment & Plan:  1. Allergic conjunctivitis, bilateral -Provided written and verbal information regarding diagnosis and treatment. - continue current eye drops and can add lubricating eye drop for comfort - resume long acting antihistamine - RTC precautions reviewed- vision change/fever/purulent drainage  2. Allergic rhinitis due to pollen -resume antihistamine of choice, consider inhaled nasal steroid  3. Lynch syndrome - encouraged patient to follow up with gyn and GI - sent her a message via mychart and offered her a new referral to GI- she really needs colonoscopy/EDG   Clarene Reamer, FNP-BC  [facility] at [facility], [facility]  09/04/2015 9:34 AM

## 2015-09-05 ENCOUNTER — Other Ambulatory Visit: Payer: Self-pay | Admitting: Family Medicine

## 2015-09-05 DIAGNOSIS — Z1509 Genetic susceptibility to other malignant neoplasm: Secondary | ICD-10-CM

## 2015-09-19 ENCOUNTER — Encounter: Payer: Self-pay | Admitting: Family Medicine

## 2015-09-24 ENCOUNTER — Encounter: Payer: Self-pay | Admitting: Internal Medicine

## 2015-09-29 ENCOUNTER — Encounter: Payer: Self-pay | Admitting: Family Medicine

## 2015-11-12 ENCOUNTER — Ambulatory Visit: Payer: BLUE CROSS/BLUE SHIELD | Admitting: Family Medicine

## 2015-11-18 ENCOUNTER — Encounter: Payer: Self-pay | Admitting: Family Medicine

## 2015-11-19 ENCOUNTER — Ambulatory Visit (INDEPENDENT_AMBULATORY_CARE_PROVIDER_SITE_OTHER): Payer: BLUE CROSS/BLUE SHIELD | Admitting: Family Medicine

## 2015-11-19 ENCOUNTER — Encounter: Payer: Self-pay | Admitting: Family Medicine

## 2015-11-19 DIAGNOSIS — K219 Gastro-esophageal reflux disease without esophagitis: Secondary | ICD-10-CM | POA: Diagnosis not present

## 2015-11-19 DIAGNOSIS — Z23 Encounter for immunization: Secondary | ICD-10-CM

## 2015-11-19 DIAGNOSIS — Z130 Encounter for screening for diseases of the blood and blood-forming organs and certain disorders involving the immune mechanism: Secondary | ICD-10-CM

## 2015-11-19 DIAGNOSIS — F411 Generalized anxiety disorder: Secondary | ICD-10-CM | POA: Diagnosis not present

## 2015-11-19 LAB — CBC WITH DIFFERENTIAL/PLATELET
BASOS ABS: 0 10*3/uL (ref 0.0–0.1)
Basophils Relative: 0.3 % (ref 0.0–3.0)
EOS ABS: 0.2 10*3/uL (ref 0.0–0.7)
Eosinophils Relative: 2.5 % (ref 0.0–5.0)
HEMATOCRIT: 34.6 % — AB (ref 36.0–46.0)
Hemoglobin: 11.2 g/dL — ABNORMAL LOW (ref 12.0–15.0)
LYMPHS ABS: 1.7 10*3/uL (ref 0.7–4.0)
LYMPHS PCT: 22.5 % (ref 12.0–46.0)
MCHC: 32.4 g/dL (ref 30.0–36.0)
MCV: 80.3 fl (ref 78.0–100.0)
MONOS PCT: 5.2 % (ref 3.0–12.0)
Monocytes Absolute: 0.4 10*3/uL (ref 0.1–1.0)
NEUTROS PCT: 69.5 % (ref 43.0–77.0)
Neutro Abs: 5.3 10*3/uL (ref 1.4–7.7)
Platelets: 158 10*3/uL (ref 150.0–400.0)
RBC: 4.31 Mil/uL (ref 3.87–5.11)
RDW: 18.1 % — ABNORMAL HIGH (ref 11.5–15.5)
WBC: 7.7 10*3/uL (ref 4.0–10.5)

## 2015-11-19 LAB — FERRITIN: Ferritin: 4 ng/mL — ABNORMAL LOW (ref 10.0–291.0)

## 2015-11-19 NOTE — Progress Notes (Signed)
Subjective:    Patient ID: Gloria Rush, female    DOB: 07/01/1975, 40 y.o.   MRN: [id number]  HPI This is a 40 yo female, accompanied by her husband and toddler son,  who presents today with complaint of acid reflux symptoms at bedtime for either a couple of weeks or months, she is not sure. She does not have symptoms typically during the day, but has symptoms after dinner and with lying down. She has been checking her peak flow and they are running 450- 500 and pulse ox is over 95. When she gets symptoms, she feels anxious and this causes her chest to feel tight. No sore throat. She is concerned that she will stop breathing. She took ranitidine for a week then stopped taking daily because she thought she was not supposed to take daily. Good symptom control with daily otc ranitidine.   She has a history of chicken allergy diagnosed with skin testing. Per the patient, she had acid reflux symptoms associated with chicken consumption after she was told her skin test was 4/5 and associated this with allergy symptoms. She was not told to avoid poultry. She has noticed recent ice cravings and is concerned about anemia. Periods not heavy.   Longstanding history of anxiety and finds this to be worse since she is staying home with her son. She has more time on her hands to worry about things. Is currently decluttering her home which is an activity she likes. Rarely exercises.   Past Medical History:  Diagnosis Date  . Allergy   . Anxiety   . Asthma   . Lynch syndrome   . Panic disorder    No past surgical history on file. Family History  Problem Relation Age of Onset  . Anxiety disorder Mother   . Anxiety disorder Brother   . Diabetes Maternal Grandfather   . Breast cancer Maternal Grandmother 44  . Breast cancer Maternal Aunt 70   Social History  Substance Use Topics  . Smoking status: Never Smoker  . Smokeless tobacco: Never Used  . Alcohol use No    Review of Systems Per HPI      Objective:   Physical Exam  Constitutional: She is oriented to person, place, and time. She appears well-developed and well-nourished. No distress.  HENT:  Head: Normocephalic and atraumatic.  Mouth/Throat: Oropharynx is clear and moist.  Eyes: Conjunctivae are normal.  Cardiovascular: Normal rate, regular rhythm and normal heart sounds.   Pulmonary/Chest: Effort normal and breath sounds normal.  Neurological: She is alert and oriented to person, place, and time.  Skin: Skin is warm and dry. She is not diaphoretic.  Psychiatric: Her behavior is normal. Judgment and thought content normal.  Mildly anxious.   Vitals reviewed.     BP 98/62 (BP Location: Left Arm, Patient Position: Sitting, Cuff Size: Normal)   Pulse 99   Temp 98.4 F (36.9 C) (Oral)   Wt 120 lb (54.4 kg)   SpO2 97%   BMI 19.97 kg/m  Wt Readings from Last 3 Encounters:  11/19/15 120 lb (54.4 kg)  09/04/15 126 lb (57.2 kg)  06/13/15 123 lb 12 oz (56.1 kg)       Assessment & Plan:  1. Gastroesophageal reflux disease, esophagitis presence not specified - resume ranitidine- can take BID for 7 days, then qd - has an appointment with GI next month, can discuss if not improved - Provided written and verbal information regarding diagnosis and treatment. - encouraged her to  keep a log of triggers  2. Screening for iron deficiency anemia - CBC with Differential/Platelet - Ferritin  3. Generalized anxiety disorder - continue lexapro - encouraged daily exercise, yoga/meditation, creative outlets and regular scheduling of activities  4. Need for influenza vaccination - Flu vaccine, recombinant, quadrivalent, inj (Flublok quad egg free)   Clarene Reamer, FNP-BC  [facility] at [facility], [place]  11/19/2015 2:04 PM

## 2015-11-19 NOTE — Progress Notes (Signed)
Pre visit review using our clinic review tool, if applicable. No additional management support is needed unless otherwise documented below in the visit note. 

## 2015-11-19 NOTE — Patient Instructions (Signed)
Take your ranitidine twice a day for 7 days then once a day   Gastroesophageal Reflux Disease, Adult Normally, food travels down the esophagus and stays in the stomach to be digested. However, when a person has gastroesophageal reflux disease (GERD), food and stomach acid move back up into the esophagus. When this happens, the esophagus becomes sore and inflamed. Over time, GERD can create small holes (ulcers) in the lining of the esophagus.  CAUSES This condition is caused by a problem with the muscle between the esophagus and the stomach (lower esophageal sphincter, or LES). Normally, the LES muscle closes after food passes through the esophagus to the stomach. When the LES is weakened or abnormal, it does not close properly, and that allows food and stomach acid to go back up into the esophagus. The LES can be weakened by certain dietary substances, medicines, and medical conditions, including:  Tobacco use.  Pregnancy.  Having a hiatal hernia.  Heavy alcohol use.  Certain foods and beverages, such as coffee, chocolate, onions, and peppermint. RISK FACTORS This condition is more likely to develop in:  People who have an increased body weight.  People who have connective tissue disorders.  People who use NSAID medicines. SYMPTOMS Symptoms of this condition include:  Heartburn.  Difficult or painful swallowing.  The feeling of having a lump in the throat.  Abitter taste in the mouth.  Bad breath.  Having a large amount of saliva.  Having an upset or bloated stomach.  Belching.  Chest pain.  Shortness of breath or wheezing.  Ongoing (chronic) cough or a night-time cough.  Wearing away of tooth enamel.  Weight loss. Different conditions can cause chest pain. Make sure to see your health care provider if you experience chest pain. DIAGNOSIS Your health care provider will take a medical history and perform a physical exam. To determine if you have mild or severe  GERD, your health care provider may also monitor how you respond to treatment. You may also have other tests, including:  An endoscopy toexamine your stomach and esophagus with a small camera.  A test thatmeasures the acidity level in your esophagus.  A test thatmeasures how much pressure is on your esophagus.  A barium swallow or modified barium swallow to show the shape, size, and functioning of your esophagus. TREATMENT The goal of treatment is to help relieve your symptoms and to prevent complications. Treatment for this condition may vary depending on how severe your symptoms are. Your health care provider may recommend:  Changes to your diet.  Medicine.  Surgery. HOME CARE INSTRUCTIONS Diet  Follow a diet as recommended by your health care provider. This may involve avoiding foods and drinks such as:  Coffee and tea (with or without caffeine).  Drinks that containalcohol.  Energy drinks and sports drinks.  Carbonated drinks or sodas.  Chocolate and cocoa.  Peppermint and mint flavorings.  Garlic and onions.  Horseradish.  Spicy and acidic foods, including peppers, chili powder, curry powder, vinegar, hot sauces, and barbecue sauce.  Citrus fruit juices and citrus fruits, such as oranges, lemons, and limes.  Tomato-based foods, such as red sauce, chili, salsa, and pizza with red sauce.  Fried and fatty foods, such as donuts, french fries, potato chips, and high-fat dressings.  High-fat meats, such as hot dogs and fatty cuts of red and white meats, such as rib eye steak, sausage, ham, and bacon.  High-fat dairy items, such as whole milk, butter, and cream cheese.  Eat small,  frequent meals instead of large meals.  Avoid drinking large amounts of liquid with your meals.  Avoid eating meals during the 2-3 hours before bedtime.  Avoid lying down right after you eat.  Do not exercise right after you eat. General Instructions  Pay attention to any  changes in your symptoms.  Take over-the-counter and prescription medicines only as told by your health care provider. Do not take aspirin, ibuprofen, or other NSAIDs unless your health care provider told you to do so.  Do not use any tobacco products, including cigarettes, chewing tobacco, and e-cigarettes. If you need help quitting, ask your health care provider.  Wear loose-fitting clothing. Do not wear anything tight around your waist that causes pressure on your abdomen.  Raise (elevate) the head of your bed 6 inches (15cm).  Try to reduce your stress, such as with yoga or meditation. If you need help reducing stress, ask your health care provider.  If you are overweight, reduce your weight to an amount that is healthy for you. Ask your health care provider for guidance about a safe weight loss goal.  Keep all follow-up visits as told by your health care provider. This is important. SEEK MEDICAL CARE IF:  You have new symptoms.  You have unexplained weight loss.  You have difficulty swallowing, or it hurts to swallow.  You have wheezing or a persistent cough.  Your symptoms do not improve with treatment.  You have a hoarse voice. SEEK IMMEDIATE MEDICAL CARE IF:  You have pain in your arms, neck, jaw, teeth, or back.  You feel sweaty, dizzy, or light-headed.  You have chest pain or shortness of breath.  You vomit and your vomit looks like blood or coffee grounds.  You faint.  Your stool is bloody or black.  You cannot swallow, drink, or eat.   This information is not intended to replace advice given to you by your health care provider. Make sure you discuss any questions you have with your health care provider.   Document Released: 10/07/2004 Document Revised: 09/18/2014 Document Reviewed: 04/24/2014 Elsevier Interactive Patient Education [employer].

## 2015-11-20 ENCOUNTER — Encounter: Payer: Self-pay | Admitting: Family Medicine

## 2015-11-28 ENCOUNTER — Other Ambulatory Visit: Payer: Self-pay | Admitting: Family Medicine

## 2015-11-28 MED ORDER — NYSTATIN 100000 UNIT/ML MT SUSP: OROMUCOSAL | 0 refills | Status: DC

## 2015-11-28 NOTE — Progress Notes (Signed)
Was contacted via mychart, patient recently seen and noted to have multiple deep fissures on tongue. Has developed white coating. Will treat with Nystatin swish and swallow, RTC if no improvement. See Mychart message for further details.

## 2015-12-17 ENCOUNTER — Encounter: Payer: Self-pay | Admitting: Family Medicine

## 2015-12-19 ENCOUNTER — Ambulatory Visit: Payer: BLUE CROSS/BLUE SHIELD | Admitting: Internal Medicine

## 2016-01-08 ENCOUNTER — Ambulatory Visit: Payer: BLUE CROSS/BLUE SHIELD | Admitting: Family Medicine

## 2016-01-08 ENCOUNTER — Emergency Department
Admission: EM | Admit: 2016-01-08 | Discharge: 2016-01-08 | Disposition: A | Discharge status: Home / Self Care | Payer: BLUE CROSS/BLUE SHIELD | Attending: Emergency Medicine | Admitting: Emergency Medicine

## 2016-01-08 DIAGNOSIS — J9801 Acute bronchospasm: Secondary | ICD-10-CM | POA: Insufficient documentation

## 2016-01-08 DIAGNOSIS — Z79899 Other long term (current) drug therapy: Secondary | ICD-10-CM | POA: Insufficient documentation

## 2016-01-08 MED ORDER — IPRATROPIUM-ALBUTEROL 0.5-2.5 (3) MG/3ML IN SOLN
3.0000 mL | Freq: Once | RESPIRATORY_TRACT | Status: AC
  Administered 2016-01-08: 3 mL via RESPIRATORY_TRACT
  Filled 2016-01-08: qty 3

## 2016-01-08 MED ORDER — ONDANSETRON 4 MG PO TBDP
4.0000 mg | ORAL_TABLET | Freq: Once | ORAL | Status: AC
  Administered 2016-01-08: 4 mg via ORAL
  Filled 2016-01-08: qty 1

## 2016-01-08 MED ORDER — PSEUDOEPH-BROMPHEN-DM 30-2-10 MG/5ML PO SYRP: 5.0000 mL | ORAL_SOLUTION | Freq: Four times a day (QID) | ORAL | 0 refills | Status: DC | PRN

## 2016-01-08 MED ORDER — HYDROCOD POLST-CPM POLST ER 10-8 MG/5ML PO SUER
5.0000 mL | Freq: Once | ORAL | Status: AC
Start: 2016-01-08 — End: 2016-01-08
  Administered 2016-01-08: 5 mL via ORAL
  Filled 2016-01-08: qty 5

## 2016-01-08 MED ORDER — METHYLPREDNISOLONE 4 MG PO TBPK: ORAL_TABLET | ORAL | 0 refills | Status: DC

## 2016-01-08 MED ORDER — AZITHROMYCIN 250 MG PO TABS: ORAL_TABLET | ORAL | 0 refills | Status: AC

## 2016-01-08 NOTE — ED Notes (Signed)
See triage note  States she developed cough and some discomfort in chest with cough yesterday   Cough has been slightly prod   No fever

## 2016-01-08 NOTE — ED Notes (Signed)
States she feels much better after SVN  resp even and non labored

## 2016-01-08 NOTE — ED Provider Notes (Signed)
[facility] Emergency Department Provider Note   ____________________________________________   First MD Initiated Contact with Patient 01/08/16 0940     (approximate)  I have reviewed the triage vital signs and the nursing notes.   HISTORY  Chief Complaint URI    HPI Gloria Rush is a 40 y.o. female patient complaining of cough or chest congestion since yesterday. Patient state has asthma and change in weather is cause flareup. Patient state using her inhaler prior to arrival but still felt tightness in the chest. Patient states she also has a cough which is mildly productive. Patient denies any fever with this complaint. Patient stated there is some mild nasal congestion. Patient rates her pain discomfort as a 5/10. Patient describes her discomfort as "tightness". No other palliative measures for her complaint.   Past Medical History:  Diagnosis Date  . Allergy   . Anxiety   . Asthma   . Lynch syndrome   . Panic disorder     Patient Active Problem List   Diagnosis Date Noted  . Chest tightness 06/13/2015  . Lynch syndrome 04/13/2013  . Unspecified vitamin D deficiency 08/24/2012  . Major depressive disorder, recurrent, in full remission ([place]) 12/18/2009  . ALLERGIC CONJUNCTIVITIS 12/23/2008  . DERMATITIS, ATOPIC 12/23/2008  . Panic disorder without agoraphobia with panic attacks in partial remission 01/23/2008  . GERD 12/05/2007  . Allergic rhinitis 12/27/2006  . Asthma, moderate persistent, well-controlled 12/27/2006    History reviewed. No pertinent surgical history.  Prior to Admission medications   Medication Sig Start Date End Date Taking? Authorizing Provider  azithromycin (ZITHROMAX Z-PAK) 250 MG tablet Take 2 tablets (500 mg) on  Day 1,  followed by 1 tablet (250 mg) once daily on Days 2 through 5. 01/08/16 01/13/16  Sable Feil, PA-C  brompheniramine-pseudoephedrine-DM 30-2-10 MG/5ML syrup Take 5 mLs by mouth 4 (four) times  daily as needed. 01/08/16   Sable Feil, PA-C  diphenhydrAMINE (BENADRYL) 25 MG tablet Take 25 mg by mouth every 6 (six) hours as needed.    Historical Provider, MD  escitalopram (LEXAPRO) 20 MG tablet TAKE 1/2 TABLET BY MOUTH DAILY. 05/16/15   Owens Loffler, MD  fluticasone (FLOVENT HFA) 110 MCG/ACT inhaler Inhale 2 puffs into the lungs 2 (two) times daily. 08/14/15   Amy Cletis [place], MD  loratadine (CLARITIN) 10 MG tablet Take 10 mg by mouth daily.    Historical Provider, MD  methylPREDNISolone (MEDROL DOSEPAK) 4 MG TBPK tablet Take Tapered dose as directed 01/08/16   Sable Feil, PA-C  mupirocin ointment (BACTROBAN) 2 % Apply topically 3 (three) times daily. 04/14/12   Owens Loffler, MD  nystatin (MYCOSTATIN) 100000 UNIT/ML suspension Swish and swallow 5 ml four times a day for 7 days 11/28/15   Elby Beck, FNP  VENTOLIN HFA 108 (90 Base) MCG/ACT inhaler USE 2 PUFFS EVERY 4 HOURS AS NEEDED FOR WHEEZING 09/02/15   Owens Loffler, MD    Allergies Amoxicillin; Chicken meat (diagnostic); and Dexilant [dexlansoprazole]  Family History  Problem Relation Age of Onset  . Anxiety disorder Mother   . Anxiety disorder Brother   . Diabetes Maternal Grandfather   . Breast cancer Maternal Grandmother 33  . Breast cancer Maternal Aunt 70    Social History Social History  Substance Use Topics  . Smoking status: Never Smoker  . Smokeless tobacco: Never Used  . Alcohol use No    Review of Systems Constitutional: No fever/chills Eyes: No visual changes. ENT: No  sore throat. Cardiovascular: Denies chest pain. Respiratory: Denies shortness of breath. Gastrointestinal: No abdominal pain.  No nausea, no vomiting.  No diarrhea.  No constipation. Genitourinary: Negative for dysuria. Musculoskeletal: Negative for back pain. Skin: Negative for rash. Neurological: Negative for headaches, focal weakness or numbness. Psychiatric: Anxiety with panic  disorder  ____________________________________________   PHYSICAL EXAM:  VITAL SIGNS: ED Triage Vitals  Enc Vitals Group     BP 01/08/16 0915 106/78     Pulse Rate 01/08/16 0915 (!) 126     Resp 01/08/16 0915 20     Temp 01/08/16 0915 98.5 F (36.9 C)     Temp Source 01/08/16 0915 Oral     SpO2 01/08/16 0915 100 %     Weight 01/08/16 0912 115 lb (52.2 kg)     Height 01/08/16 0912 5\' 5"  (1.651 m)     Head Circumference --      Peak Flow --      Pain Score 01/08/16 0913 5     Pain Loc --      Pain Edu? --      Excl. in [place]? --     Constitutional: Alert and oriented. Well appearing and in no acute distress. Eyes: Conjunctivae are normal. PERRL. EOMI. Head: Atraumatic. Nose: Nasal congestion Mouth/Throat: Mucous membranes are moist.  Oropharynx non-erythematous. Neck: No stridor.  No cervical spine tenderness to palpation. Hematological/Lymphatic/Immunilogical: No cervical lymphadenopathy. Cardiovascular: Normal rate, regular rhythm. Grossly normal heart sounds.  Good peripheral circulation. Respiratory: Normal respiratory effort.  No retractions. Lungs wheezing with nonproductive cough Gastrointestinal: Soft and nontender. No distention. No abdominal bruits. No CVA tenderness. Musculoskeletal: No lower extremity tenderness nor edema.  No joint effusions. Neurologic:  Normal speech and language. No gross focal neurologic deficits are appreciated. No gait instability. Skin:  Skin is warm, dry and intact. No rash noted. Psychiatric: Mood and affect are normal. Speech and behavior are normal.  ____________________________________________   LABS (all labs ordered are listed, but only abnormal results are displayed)  Labs Reviewed - No data to display ____________________________________________  EKG   ____________________________________________  RADIOLOGY   ____________________________________________   PROCEDURES  Procedure(s) performed:  None  Procedures  Critical Care performed: No  ____________________________________________   INITIAL IMPRESSION / ASSESSMENT AND PLAN / ED COURSE  Pertinent labs & imaging results that were available during my care of the patient were reviewed by me and considered in my medical decision making (see chart for details).  Cough secondary to bronchospasm. Discussed sequela of complaint with patient. Patient given discharge care instructions. Patient given prescription for Medrol Dosepak, from felt DM, and Zithromax. Patient advised follow-up family doctor condition persists.  Clinical Course   Patient presents with cough and chest congestion for 2 days. Patient states change in weather initiated an asthma flareup. Patient stated only mild relief with inhaler. Patient responding well to one DuoNeb treatment.   ____________________________________________   FINAL CLINICAL IMPRESSION(S) / ED DIAGNOSES  Final diagnoses:  Cough due to bronchospasm      NEW MEDICATIONS STARTED DURING THIS VISIT:  New Prescriptions   AZITHROMYCIN (ZITHROMAX Z-PAK) 250 MG TABLET    Take 2 tablets (500 mg) on  Day 1,  followed by 1 tablet (250 mg) once daily on Days 2 through 5.   BROMPHENIRAMINE-PSEUDOEPHEDRINE-DM 30-2-10 MG/5ML SYRUP    Take 5 mLs by mouth 4 (four) times daily as needed.   METHYLPREDNISOLONE (MEDROL DOSEPAK) 4 MG TBPK TABLET    Take Tapered dose as directed  Note:  This document was prepared using Dragon voice recognition software and may include unintentional dictation errors.    Sable Feil, PA-C 01/08/16 [place], MD 01/08/16 [phone]

## 2016-01-08 NOTE — ED Triage Notes (Signed)
Pt c/o cough with congestion since yesterday, states she has asthma and that has flared.. Pt is in NAD on arrival..

## 2016-01-10 ENCOUNTER — Telehealth: Payer: Self-pay | Admitting: Family Medicine

## 2016-01-10 NOTE — Telephone Encounter (Signed)
[facility] Patient Name: Gloria Rush DOB: 02-Feb-1975 Initial Comment Caller has bronchitis, has a nebulizer, was hoping to get an Rx for Albuterol to help. Has a cough. Nurse Assessment Nurse: Dimas Chyle, RN, Dellis Filbert Date/Time Eilene Ghazi Time): 01/10/2016 1:49:51 PM Confirm and document reason for call. If symptomatic, describe symptoms. ---Caller has bronchitis, has a nebulizer, was hoping to get an Rx for Albuterol to help. Has a cough. On antibiotic and prednisone. Caller has rescue inhaler. Needing albuterol solution for nebulizer. Seen in ED on Thursday morning. Does the patient have any new or worsening symptoms? ---Yes Will a triage be completed? ---Yes Related visit to physician within the last 2 weeks? ---Yes Does the PT have any chronic conditions? (i.e. diabetes, asthma, etc.) ---Yes List chronic conditions. ---Asthma and anemic Is the patient pregnant or possibly pregnant? (Ask all females between the ages of 69-55) ---No Is this a behavioral health or substance abuse call? ---No Guidelines Guideline Title Affirmed Question Affirmed Notes Asthma Attack [1] MILD asthma attack (e.g., no SOB at rest, mild SOB with walking, speaks normally in sentences, mild wheezing) AND [2] persists > 24 hours on appropriate treatment Final Disposition User See Physician within [place], RN, Dellis Filbert Comments Called back to let caller know that I had spoken with on-call, Dr. Sarajane Jews, and that he agreed to Rx for albuterol vials for nebulizer and Rx was called in to the requested pharmacy. Referrals REFERRED TO PCP OFFICE Disagree/Comply: Comply

## 2016-01-13 NOTE — Telephone Encounter (Signed)
FYI: refer to team health note from the weekend.  Appears on call - Dr. Sarajane Jews responded and managed care need.  Patient has an appointment with PCP, Dr. Lorelei Pont, on 01/28/16 for follow up.

## 2016-01-21 ENCOUNTER — Encounter: Payer: Self-pay | Admitting: Family Medicine

## 2016-01-21 ENCOUNTER — Ambulatory Visit: Payer: Self-pay | Admitting: Family Medicine

## 2016-01-21 ENCOUNTER — Ambulatory Visit (INDEPENDENT_AMBULATORY_CARE_PROVIDER_SITE_OTHER): Payer: BLUE CROSS/BLUE SHIELD | Admitting: Family Medicine

## 2016-01-21 VITALS — BP 100/60 | HR 88 | Temp 98.0°F | Ht 65.0 in | Wt 117.5 lb

## 2016-01-21 DIAGNOSIS — K219 Gastro-esophageal reflux disease without esophagitis: Secondary | ICD-10-CM | POA: Diagnosis not present

## 2016-01-21 DIAGNOSIS — F410 Panic disorder [episodic paroxysmal anxiety] without agoraphobia: Secondary | ICD-10-CM | POA: Diagnosis not present

## 2016-01-21 DIAGNOSIS — J4540 Moderate persistent asthma, uncomplicated: Secondary | ICD-10-CM

## 2016-01-21 MED ORDER — PANTOPRAZOLE SODIUM 40 MG PO TBEC: 40.0000 mg | DELAYED_RELEASE_TABLET | Freq: Every day | ORAL | 5 refills | Status: DC

## 2016-01-21 MED ORDER — MUPIROCIN 2 % EX OINT: TOPICAL_OINTMENT | Freq: Three times a day (TID) | CUTANEOUS | 2 refills | Status: DC

## 2016-01-21 NOTE — Patient Instructions (Addendum)
Start to take the [place]: 2 puffs twice a day.  Take your protonix 1 tablet: 30 minutes before breakfast

## 2016-01-21 NOTE — Progress Notes (Signed)
Dr. Frederico Hamman T. Ahniya Mitchum, MD, [facility] [place], [address] Phone: [phone] Fax: [phone]  01/21/2016  Patient: Gloria Rush, MRN: [id number], DOB: [birth year], 41 y.o.  Primary Physician:  Owens Loffler, MD   Chief Complaint  Patient presents with  . Gastroesophageal Reflux  . Asthma  . Follow-up    ER visit   Subjective:   Gloria Rush is a 41 y.o. very pleasant female patient who presents with the following:  Had a URI and then got some bronchospasm. She has been in a cycle where she has been sick some, she'll get some bronchospasm and has some worsening of her asthma, and then at the same time she also has been having some worsening reflux-type symptoms that is now poorly controlled with p.r.n. Zantac.  This is feeding on itself and she recognizes that her anxiety is making all of this worse.  She is sometimes having a difficult time telling if some of her symptoms are GERD related versus asthma related.  GERD has been really terrible.   Flovent once a day. She is only doing 1 puff once a day.  Previously she has been on Asmanex and multiple other medications including Advair.  She had been on better control.  Past Medical History, Surgical History, Social History, Family History, Problem List, Medications, and Allergies have been reviewed and updated if relevant.  Patient Active Problem List   Diagnosis Date Noted  . Lynch syndrome 04/13/2013    Priority: High  . Major depressive disorder, recurrent, in full remission ([place]) 12/18/2009    Priority: Medium  . Panic disorder without agoraphobia with panic attacks in partial remission 01/23/2008    Priority: Medium  . Asthma, moderate persistent, well-controlled 12/27/2006    Priority: Medium  . Unspecified vitamin D deficiency 08/24/2012  . ALLERGIC CONJUNCTIVITIS 12/23/2008  . DERMATITIS, ATOPIC 12/23/2008  . GERD 12/05/2007  . Allergic  rhinitis 12/27/2006    Past Medical History:  Diagnosis Date  . Allergy   . Anxiety   . Asthma   . Lynch syndrome   . Panic disorder     No past surgical history on file.  Social History   Social History  . Marital status: Married    Spouse name: N/A  . Number of children: N/A  . Years of education: N/A   Occupational History  . Not on file.   Social History Main Topics  . Smoking status: Never Smoker  . Smokeless tobacco: Never Used  . Alcohol use No  . Drug use: No  . Sexual activity: Yes    Partners: Male   Other Topics Concern  . Not on file   Social History Narrative  . No narrative on file    Family History  Problem Relation Age of Onset  . Anxiety disorder Mother   . Anxiety disorder Brother   . Diabetes Maternal Grandfather   . Breast cancer Maternal Grandmother 35  . Breast cancer Maternal Aunt 70    Allergies  Allergen Reactions  . Amoxicillin     Itching (once), no rash Rapid heart rate  . Chicken Meat (Diagnostic) Other (See Comments)    Throat feels tight and GERD  . Dexilant [Dexlansoprazole] Other (See Comments)    Throat feels tight    Medication list reviewed and updated in full in [place].   GEN: No acute illnesses, no fevers, chills. GI: No n/v/d, eating normally Pulm: No  SOB Interactive and getting along well at home.  Otherwise, ROS is as per the HPI.  Objective:   BP 100/60   Pulse 88   Temp 98 F (36.7 C) (Oral)   Ht 5\' 5"  (1.651 m)   Wt 117 lb 8 oz (53.3 kg)   LMP 12/24/2015   SpO2 99%   BMI 19.55 kg/m   GEN: WDWN, NAD, Non-toxic, A & O x 3 HEENT: Atraumatic, Normocephalic. Neck supple. No masses, No LAD. Ears and Nose: No external deformity. CV: RRR, No M/G/R. No JVD. No thrill. No extra heart sounds. PULM: CTA B, no wheezes, crackles, rhonchi. No retractions. No resp. distress. No accessory muscle use. EXTR: No c/c/e NEURO Normal gait.  PSYCH: Normally interactive. Conversant. Not depressed or  anxious appearing.  Calm demeanor.   Laboratory and Imaging Data:  Assessment and Plan:   Asthma, moderate persistent, well-controlled  Gastroesophageal reflux disease, esophagitis presence not specified  Panic disorder without agoraphobia with panic attacks in partial remission  Increase Flovent to 2 puffs twice a day. Change to PPI, 30 minutes prior to breakfast.  I spoken to the patient in the office and after her office visit, and her anxiety has increased, she has lost some weight, and I think that this is making all of her symptoms worse.  She is going to follow up with a psychologist for counseling.  Continue Lexapro.  Follow-up: 6 weeks for CPX  Meds ordered this encounter  Medications  . albuterol (PROVENTIL) (2.5 MG/3ML) 0.083% nebulizer solution    Sig: USE ONE VIAL VIA NEBULIZER EVERY 4 HOURS AS NEEDED FOR SHORTNESS OF BREATH    Refill:  0  . ranitidine (ZANTAC 150 MAXIMUM STRENGTH) 150 MG tablet  . mupirocin ointment (BACTROBAN) 2 %    Sig: Apply topically 3 (three) times daily.    Dispense:  22 g    Refill:  2  . pantoprazole (PROTONIX) 40 MG tablet    Sig: Take 1 tablet (40 mg total) by mouth daily.    Dispense:  30 tablet    Refill:  5   Medications Discontinued During This Encounter  Medication Reason  . loratadine (CLARITIN) 10 MG tablet Completed Course  . methylPREDNISolone (MEDROL DOSEPAK) 4 MG TBPK tablet Completed Course  . brompheniramine-pseudoephedrine-DM 30-2-10 MG/5ML syrup Completed Course  . mupirocin ointment (BACTROBAN) 2 % Reorder   No orders of the defined types were placed in this encounter.   Signed,  Maud Deed. Kelley Knoth, MD   Allergies as of 01/21/2016      Reactions   Amoxicillin    Itching (once), no rash Rapid heart rate   Chicken Meat (diagnostic) Other (See Comments)   Throat feels tight and GERD   Dexilant [dexlansoprazole] Other (See Comments)   Throat feels tight      Medication List       Accurate as of 01/21/16  11:59 PM. Always use your most recent med list.          diphenhydrAMINE 25 MG tablet Commonly known as:  BENADRYL Take 25 mg by mouth every 6 (six) hours as needed.   escitalopram 20 MG tablet Commonly known as:  LEXAPRO TAKE 1/2 TABLET BY MOUTH DAILY.   fluticasone 110 MCG/ACT inhaler Commonly known as:  FLOVENT HFA Inhale 2 puffs into the lungs 2 (two) times daily.   mupirocin ointment 2 % Commonly known as:  BACTROBAN Apply topically 3 (three) times daily.   nystatin 100000 UNIT/ML suspension Commonly known as:  MYCOSTATIN Swish and swallow 5 ml four times a day for 7 days   pantoprazole 40 MG tablet Commonly known as:  PROTONIX Take 1 tablet (40 mg total) by mouth daily.   VENTOLIN HFA 108 (90 Base) MCG/ACT inhaler Generic drug:  albuterol USE 2 PUFFS EVERY 4 HOURS AS NEEDED FOR WHEEZING   albuterol (2.5 MG/3ML) 0.083% nebulizer solution Commonly known as:  PROVENTIL USE ONE VIAL VIA NEBULIZER EVERY 4 HOURS AS NEEDED FOR SHORTNESS OF BREATH   ZANTAC 150 MAXIMUM STRENGTH 150 MG tablet Generic drug:  ranitidine

## 2016-01-21 NOTE — Progress Notes (Signed)
Pre visit review using our clinic review tool, if applicable. No additional management support is needed unless otherwise documented below in the visit note. 

## 2016-01-22 ENCOUNTER — Encounter: Payer: Self-pay | Admitting: Family Medicine

## 2016-01-25 ENCOUNTER — Other Ambulatory Visit: Payer: Self-pay | Admitting: Family Medicine

## 2016-01-28 ENCOUNTER — Encounter: Payer: BLUE CROSS/BLUE SHIELD | Admitting: Family Medicine

## 2016-02-02 ENCOUNTER — Encounter: Payer: Self-pay | Admitting: Family Medicine

## 2016-02-03 ENCOUNTER — Ambulatory Visit (INDEPENDENT_AMBULATORY_CARE_PROVIDER_SITE_OTHER): Payer: BLUE CROSS/BLUE SHIELD | Admitting: Family Medicine

## 2016-02-03 ENCOUNTER — Encounter: Payer: Self-pay | Admitting: Family Medicine

## 2016-02-03 DIAGNOSIS — K219 Gastro-esophageal reflux disease without esophagitis: Secondary | ICD-10-CM

## 2016-02-03 NOTE — Patient Instructions (Signed)
Use nystatin for now and stay on protonix.  Take care.  Glad to see you.  Update [place] as needed.

## 2016-02-03 NOTE — Progress Notes (Signed)
Pre visit review using our clinic review tool, if applicable. No additional management support is needed unless otherwise documented below in the visit note. 

## 2016-02-03 NOTE — Progress Notes (Signed)
Unclear if prev potential dexliant response was really related to anxiety, per patient.  D/w pt.    Recently stated on protonix and GERD is better.  She doesn't have tightness in throat now (and that was prev causing higher anxiety level).  No FCNAVD.  Not wheezing.  Tongue feels different now.  She may have some thrust and recently started nystatin, with improvement in white patches in the mouth.  She has some burning on the tongue recently noted, possibly related to thrush.  No clearly visible tongue swelling.  Normal speech.  No lip swelling.    She is starting counseling in 02/2016.    Meds, vitals, and allergies reviewed.   ROS: Per HPI unless specifically indicated in ROS section   nad ncat Tm wnl B Nasal and OP exam wnl except for mild thrush in the mouth.  No lip or tongue swelling.  No stridor.  No LA No inc wob

## 2016-02-03 NOTE — Assessment & Plan Note (Signed)
Likely improved, likely tolerating PPI, likely with partially treated thrush.  Continue nystatin and update PCP if not better.  I don't any sign of angioedema.  D/w pt.  Okay to continue PPI.

## 2016-02-06 ENCOUNTER — Encounter: Payer: Self-pay | Admitting: Family Medicine

## 2016-02-06 ENCOUNTER — Other Ambulatory Visit: Payer: Self-pay | Admitting: Family Medicine

## 2016-02-06 MED ORDER — ESOMEPRAZOLE MAGNESIUM 40 MG PO CPDR: 40.0000 mg | DELAYED_RELEASE_CAPSULE | Freq: Every day | ORAL | 5 refills | Status: DC

## 2016-02-07 ENCOUNTER — Ambulatory Visit: Payer: BLUE CROSS/BLUE SHIELD | Admitting: Family Medicine

## 2016-02-10 MED ORDER — MOMETASONE FUROATE 220 MCG/INH IN AEPB: 1.0000 | INHALATION_SPRAY | Freq: Two times a day (BID) | RESPIRATORY_TRACT | 11 refills | Status: DC

## 2016-02-10 NOTE — Telephone Encounter (Signed)
Asmanex prescription sent to [facility] in [place] as instructed by Dr. Lorelei Pont.

## 2016-02-10 NOTE — Telephone Encounter (Signed)
Can you help.  Asmanex twisthaler. 1 puff bid.  Electronically prescribe to the pharmacy of their choice. (May call in if pharmacy does not participate in electronic prescriptions) Call in #30, 11 refills. OR if they prefer a 90 day supply, #90 with 3 refills is OK, too Prescription instructions above

## 2016-02-11 ENCOUNTER — Telehealth: Payer: Self-pay | Admitting: Family Medicine

## 2016-02-11 NOTE — Telephone Encounter (Signed)
Patient had appointment on 02/07/16 with Dr.Copland [facility].  Patient called [facility] at 6 am Saturday morning to let them know she wanted to cancel the appointment. They said they would send a note to let them know she wouldn't be coming to the appointment. Patient said she just got a notice that she missed the appointment. Patient wants to make sure she won't receive a no show charge. Please call patient.

## 2016-02-17 ENCOUNTER — Encounter: Payer: Self-pay | Admitting: Internal Medicine

## 2016-02-17 ENCOUNTER — Ambulatory Visit (INDEPENDENT_AMBULATORY_CARE_PROVIDER_SITE_OTHER): Payer: BLUE CROSS/BLUE SHIELD | Admitting: Internal Medicine

## 2016-02-17 VITALS — BP 90/62 | HR 88 | Ht 65.0 in | Wt 116.0 lb

## 2016-02-17 DIAGNOSIS — K219 Gastro-esophageal reflux disease without esophagitis: Secondary | ICD-10-CM | POA: Diagnosis not present

## 2016-02-17 DIAGNOSIS — F458 Other somatoform disorders: Secondary | ICD-10-CM | POA: Diagnosis not present

## 2016-02-17 DIAGNOSIS — Z1509 Genetic susceptibility to other malignant neoplasm: Secondary | ICD-10-CM

## 2016-02-17 DIAGNOSIS — R0989 Other specified symptoms and signs involving the circulatory and respiratory systems: Secondary | ICD-10-CM

## 2016-02-17 MED ORDER — RANITIDINE HCL 150 MG PO TABS: 150.0000 mg | ORAL_TABLET | Freq: Every evening | ORAL | 2 refills | Status: DC

## 2016-02-17 MED ORDER — NA SULFATE-K SULFATE-MG SULF 17.5-3.13-1.6 GM/177ML PO SOLN: ORAL | 0 refills | Status: DC

## 2016-02-17 NOTE — Patient Instructions (Signed)
You have been scheduled for an endoscopy and colonoscopy. Please follow the written instructions given to you at your visit today. Please pick up your prep supplies at the pharmacy within the next 1-3 days. If you use inhalers (even only as needed), please bring them with you on the day of your procedure. Your physician has requested that you go to www.startemmi.com and enter the access code given to you at your visit today. This web site gives a general overview about your procedure. However, you should still follow specific instructions given to you by our office regarding your preparation for the procedure.  We have sent the following medications to your pharmacy for you to pick up at your convenience: Nexium 40 mg every morning Zantac 150 mg every evening  If you are age 39 or older, your body mass index should be between 23-30. Your Body mass index is 19.3 kg/m. If this is out of the aforementioned range listed, please consider follow up with your Primary Care Provider.  If you are age 37 or younger, your body mass index should be between 19-25. Your Body mass index is 19.3 kg/m. If this is out of the aformentioned range listed, please consider follow up with your Primary Care Provider.

## 2016-02-17 NOTE — Progress Notes (Signed)
Patient ID: Gloria Rush, female   DOB: February 26, 1975, 41 y.o.   MRN: [id number] HPI: Gloria Rush is a 41 yo female with PMH of Lynch Syndrome (diagnosed by genetic testing performed by her OB/GYN MD), asthma, anxiety and panic disorder who seen in consultation at the request of Tor Netters, NP to consider colonoscopy in the setting of Lynch syndrome. She is here today with her husband and son.  She reports she is confused about the overall Lynch syndrome diagnosis but reportedly this was made by genetic analysis performed by her gynecologist. I do not have this report available but we will request it.  She denies a family history of colorectal cancer. Reportedly her mother and father have not had colon polyps. She is the only relative she is aware of that has had genetic testing. She does report she is undergoing endometrial surveillance with ultrasound and biopsy annually. These have been negative.  She reports bowel habits is regular without blood or melena. No diarrhea or constipation. No change in bowel habits.  She is having issues with reflux and tightness in her chest as well as feeling that something is present in her throat. She is constantly anxious and worried about her airway being affected or that she "may stop breathing". At times she refluxes and feels like she is choking. No solid food dysphagia. No odynophagia. She does struggle with anxiety and is addressing this with primary care. She took pantoprazole which didn't seem to help. She's been on Nexium 40 mg daily for the last 11 days and reflux symptoms have improved.  She states that the Lynch syndrome genetic diagnosis has caused a lot of anxiety and she "I wish I never had that done"  Past Medical History:  Diagnosis Date  . Allergy   . Anxiety   . Asthma   . Lynch syndrome   . Panic disorder     Past Surgical History:  Procedure Laterality Date  . WISDOM TOOTH EXTRACTION      Outpatient Medications Prior to Visit   Medication Sig Dispense Refill  . albuterol (PROVENTIL) (2.5 MG/3ML) 0.083% nebulizer solution USE ONE VIAL VIA NEBULIZER EVERY 4 HOURS AS NEEDED FOR SHORTNESS OF BREATH  0  . diphenhydrAMINE (BENADRYL) 25 MG tablet Take 25 mg by mouth every 6 (six) hours as needed.    Marland Kitchen escitalopram (LEXAPRO) 20 MG tablet TAKE 1/2 TABLET BY MOUTH DAILY. 45 tablet 1  . esomeprazole (NEXIUM) 40 MG capsule Take 1 capsule (40 mg total) by mouth daily. 30 mins before breakfast 30 capsule 5  . mometasone (ASMANEX 60 METERED DOSES) 220 MCG/INH inhaler Inhale 1 puff into the lungs 2 (two) times daily. 1 Inhaler 11  . mupirocin ointment (BACTROBAN) 2 % Apply topically 3 (three) times daily. 22 g 2  . nystatin (MYCOSTATIN) 100000 UNIT/ML suspension Swish and swallow 5 ml four times a day for 7 days 200 mL 0  . VENTOLIN HFA 108 (90 Base) MCG/ACT inhaler USE 2 PUFFS EVERY 4 HOURS AS NEEDED FOR WHEEZING 18 Inhaler 0  . fluticasone (FLOVENT HFA) 110 MCG/ACT inhaler Inhale 2 puffs into the lungs 2 (two) times daily. 1 Inhaler 12  . ranitidine (ZANTAC 150 MAXIMUM STRENGTH) 150 MG tablet      No facility-administered medications prior to visit.     Allergies  Allergen Reactions  . Amoxicillin     Itching (once), no rash Rapid heart rate  . Chicken Meat (Diagnostic) Other (See Comments)    Throat feels tight  and GERD  . Dexilant [Dexlansoprazole] Other (See Comments)    Throat feels tight    Family History  Problem Relation Age of Onset  . Anxiety disorder Mother   . Anxiety disorder Brother   . Diabetes Maternal Grandfather   . Breast cancer Maternal Grandmother 12  . Breast cancer Maternal Aunt 70  . Colon cancer Neg Hx   . Stomach cancer Neg Hx     Social History  Substance Use Topics  . Smoking status: Never Smoker  . Smokeless tobacco: Never Used  . Alcohol use No    ROS: As per history of present illness, otherwise negative  BP 90/62   Pulse 88   Ht 5\' 5"  (1.651 m)   Wt 116 lb (52.6 kg)    LMP 01/28/2016   BMI 19.30 kg/m  Constitutional: Well-developed and well-nourished. No distress. HEENT: Normocephalic and atraumatic. Oropharynx is clear and moist. No oropharyngeal exudate. Conjunctivae are normal.  No scleral icterus. Neck: Neck supple. Trachea midline. Cardiovascular: Normal rate, regular rhythm and intact distal pulses. No M/R/G Pulmonary/chest: Effort normal and breath sounds normal. No wheezing, rales or rhonchi. Abdominal: Soft, nontender, nondistended. Bowel sounds active throughout. There are no masses palpable. No hepatosplenomegaly. Extremities: no clubbing, cyanosis, or edema Lymphadenopathy: No cervical adenopathy noted. Neurological: Alert and oriented to person place and time. Skin: Skin is warm and dry. No rashes noted. Psychiatric: Normal mood and affect. Behavior is normal.  RELEVANT LABS AND IMAGING: CBC    Component Value Date/Time   WBC 7.7 11/19/2015 1357   RBC 4.31 11/19/2015 1357   HGB 11.2 (L) 11/19/2015 1357   HGB 13.9 12/27/2013 1949   HCT 34.6 (L) 11/19/2015 1357   HCT 43.1 12/27/2013 1949   PLT 158.0 11/19/2015 1357   PLT 187 12/27/2013 1949   MCV 80.3 11/19/2015 1357   MCV 89 12/27/2013 1949   MCH 28.6 12/27/2013 1949   MCHC 32.4 11/19/2015 1357   RDW 18.1 (H) 11/19/2015 1357   RDW 13.7 12/27/2013 1949   LYMPHSABS 1.7 11/19/2015 1357   MONOABS 0.4 11/19/2015 1357   EOSABS 0.2 11/19/2015 1357   BASOSABS 0.0 11/19/2015 1357    CMP     Component Value Date/Time   NA 138 12/27/2013 1949   K 3.2 (L) 12/27/2013 1949   CL 107 12/27/2013 1949   CO2 20 (L) 12/27/2013 1949   GLUCOSE 108 (H) 12/27/2013 1949   BUN 8 12/27/2013 1949   CREATININE 0.91 12/27/2013 1949   CALCIUM 8.7 12/27/2013 1949   PROT 6.8 02/02/2013 1248   ALBUMIN 3.8 02/02/2013 1248   AST 15 02/02/2013 1248   ALT 13 02/02/2013 1248   ALKPHOS 73 02/02/2013 1248   BILITOT 0.8 02/02/2013 1248   GFRNONAA >60 12/27/2013 1949   GFRAA >60 12/27/2013 1949     ASSESSMENT/PLAN: 41 yo female with PMH of Lynch Syndrome (diagnosed by genetic testing performed by her OB/GYN MD), asthma, anxiety and panic disorder who seen in consultation at the request of Tor Netters, NP to consider colonoscopy in the setting of Lynch syndrome.  1. Lynch syndrome -- I would like to review her genetic analysis to determine the basis for the Lynch syndrome diagnosis. Assuming this is correct she will need annual screening/surveillance colonoscopy. I recommended colonoscopy at this time and we discussed the risk benefits and alternatives and she wishes to proceed. --We also touched on other screening necessary for Lynch patient's. Including annual screening for endometrial and ovarian cancer with pelvic exam, endometrial  biopsy and transvaginal ultrasound. EGD with biopsy of the gastric antrum, see #2. Annual urinalysis. Annual physical exam including skin and neurologic exam.  2. GERD -- symptoms insistent with GERD and perhaps some element of esophageal spasm. Anxiety also contributing. We'll have her continue Nexium 40 mg each morning and add ranitidine 150 mg in the evening or close to bedtime. Upper endoscopy recommended to evaluate GERD symptoms and also evaluate the stomach and plan biopsy to rule out H. pylori in the setting of Lynch syndrome   [id number] [address], [place], [place] [address]

## 2016-02-18 ENCOUNTER — Encounter: Payer: Self-pay | Admitting: Internal Medicine

## 2016-02-19 ENCOUNTER — Encounter: Payer: Self-pay | Admitting: Internal Medicine

## 2016-02-19 ENCOUNTER — Other Ambulatory Visit: Payer: Self-pay | Admitting: Family Medicine

## 2016-02-19 NOTE — Telephone Encounter (Signed)
Still haven't seen the Lynch genetic test Let her know you are working to get it

## 2016-02-20 NOTE — Telephone Encounter (Signed)
Please let patient know that her procedures are felt medically necessary as indicated in my last office note Providing her insurance my office note is documentation of such medical necessity She is very anxious about this process so that you can be aware when you call her

## 2016-03-04 ENCOUNTER — Encounter: Payer: Self-pay | Admitting: Family Medicine

## 2016-03-10 ENCOUNTER — Other Ambulatory Visit: Payer: Self-pay

## 2016-03-11 ENCOUNTER — Encounter: Payer: Self-pay | Admitting: Family Medicine

## 2016-03-15 ENCOUNTER — Telehealth: Payer: Self-pay | Admitting: Obstetrics and Gynecology

## 2016-03-15 NOTE — Telephone Encounter (Signed)
Pt is calling to make sure see will not be charge because she reschedule From 03/10/16   Appointment 04/08/16 for GYN before her annual. Please contact patient. Thank you Cb# [phone]

## 2016-03-16 ENCOUNTER — Encounter: Payer: Self-pay | Admitting: Internal Medicine

## 2016-03-17 NOTE — Telephone Encounter (Signed)
Patient will not be charged.

## 2016-03-23 ENCOUNTER — Telehealth: Payer: Self-pay

## 2016-03-23 ENCOUNTER — Encounter: Payer: Self-pay | Admitting: Internal Medicine

## 2016-03-23 DIAGNOSIS — N920 Excessive and frequent menstruation with regular cycle: Secondary | ICD-10-CM

## 2016-03-23 MED ORDER — TRANEXAMIC ACID 650 MG PO TABS: 1300.0000 mg | ORAL_TABLET | Freq: Three times a day (TID) | ORAL | 0 refills | Status: AC

## 2016-03-23 NOTE — Telephone Encounter (Signed)
Patient states she had discussed with [facility] a medication to help slow down heavy periods. Per pt she used almost 20 tampons in one day during her last period. Please advise. Cb# [phone] thank you

## 2016-03-23 NOTE — Telephone Encounter (Signed)
Left msg for pt with this information  

## 2016-03-23 NOTE — Telephone Encounter (Signed)
Will try lysteda for menorrhagia. Rx sent. RN to notify pt. Start with first day of next menses.

## 2016-03-24 ENCOUNTER — Encounter: Payer: Self-pay | Admitting: Internal Medicine

## 2016-03-25 ENCOUNTER — Encounter: Payer: Self-pay | Admitting: Internal Medicine

## 2016-03-25 NOTE — Telephone Encounter (Signed)
Patient called in stating that our office has already contacted her insurance company and is wanting to know why pharmacy will not let her pick prep up. Best # [phone]

## 2016-03-26 ENCOUNTER — Other Ambulatory Visit: Payer: Self-pay | Admitting: Family Medicine

## 2016-03-26 NOTE — Telephone Encounter (Signed)
Patient states that her pharmacy did not receive coupon. [facility] in [place]. Please resend. Best # [phone]

## 2016-03-30 ENCOUNTER — Ambulatory Visit (AMBULATORY_SURGERY_CENTER): Payer: BLUE CROSS/BLUE SHIELD | Admitting: Internal Medicine

## 2016-03-30 ENCOUNTER — Encounter: Payer: Self-pay | Admitting: Family Medicine

## 2016-03-30 ENCOUNTER — Encounter: Payer: Self-pay | Admitting: Internal Medicine

## 2016-03-30 VITALS — BP 103/65 | HR 74 | Temp 98.6°F | Resp 12 | Ht 65.0 in | Wt 116.0 lb

## 2016-03-30 DIAGNOSIS — K2950 Unspecified chronic gastritis without bleeding: Secondary | ICD-10-CM | POA: Diagnosis not present

## 2016-03-30 DIAGNOSIS — K219 Gastro-esophageal reflux disease without esophagitis: Secondary | ICD-10-CM

## 2016-03-30 DIAGNOSIS — Z1211 Encounter for screening for malignant neoplasm of colon: Secondary | ICD-10-CM | POA: Diagnosis present

## 2016-03-30 DIAGNOSIS — Z1509 Genetic susceptibility to other malignant neoplasm: Secondary | ICD-10-CM

## 2016-03-30 MED ORDER — SODIUM CHLORIDE 0.9 % IV SOLN: 500.0000 mL | INTRAVENOUS | Status: DC

## 2016-03-30 NOTE — Progress Notes (Signed)
Spontaneous respirations throughout. VSS. Resting comfortably. To PACU on room air. Report to  [place].

## 2016-03-30 NOTE — Patient Instructions (Addendum)
YOU HAD AN ENDOSCOPIC PROCEDURE TODAY AT [facility]:   Refer to the procedure report that was given to you for any specific questions about what was found during the examination.  If the procedure report does not answer your questions, please call your gastroenterologist to clarify.  If you requested that your care partner not be given the details of your procedure findings, then the procedure report has been included in a sealed envelope for you to review at your convenience later.  YOU SHOULD EXPECT: Some feelings of bloating in the abdomen. Passage of more gas than usual.  Walking can help get rid of the air that was put into your GI tract during the procedure and reduce the bloating. If you had a lower endoscopy (such as a colonoscopy or flexible sigmoidoscopy) you may notice spotting of blood in your stool or on the toilet paper. If you underwent a bowel prep for your procedure, you may not have a normal bowel movement for a few days.  Please Note:  You might notice some irritation and congestion in your nose or some drainage.  This is from the oxygen used during your procedure.  There is no need for concern and it should clear up in a day or so.  SYMPTOMS TO REPORT IMMEDIATELY:   Following lower endoscopy (colonoscopy or flexible sigmoidoscopy):  Excessive amounts of blood in the stool  Significant tenderness or worsening of abdominal pains  Swelling of the abdomen that is new, acute  Fever of 100F or higher   Following upper endoscopy (EGD)  Vomiting of blood or coffee ground material  New chest pain or pain under the shoulder blades  Painful or persistently difficult swallowing  New shortness of breath  Fever of 100F or higher  Black, tarry-looking stools  For urgent or emergent issues, a gastroenterologist can be reached at any hour by calling [phone].   DIET:  We do recommend a small meal at first, but then you may proceed to your regular diet.  Drink  plenty of fluids but you should avoid alcoholic beverages for 24 hours.  MEDICATIONS:  Increase the Nexium you are currently taking to twice a day BEFORE meals for one month to see if it improves your symptoms. Otherwise, continue present medications.  ACTIVITY:  You should plan to take it easy for the rest of today and you should NOT DRIVE or use heavy machinery until tomorrow (because of the sedation medicines used during the test).    FOLLOW UP: Our staff will call the number listed on your records the next business day following your procedure to check on you and address any questions or concerns that you may have regarding the information given to you following your procedure. If we do not reach you, we will leave a message.  However, if you are feeling well and you are not experiencing any problems, there is no need to return our call.  We will assume that you have returned to your regular daily activities without incident.  If any biopsies were taken you will be contacted by phone or by letter within the next 1-3 weeks.  Please call us at [phone] if you have not heard about the biopsies in 3 weeks.   Please see handouts given to you by your recovery nurse.  Thank you for allowing [place] to provide for your healthcare needs today.  SIGNATURES/CONFIDENTIALITY: You and/or your care partner have signed paperwork which will be entered into your electronic  medical record.  These signatures attest to the fact that that the information above on your After Visit Summary has been reviewed and is understood.  Full responsibility of the confidentiality of this discharge information lies with you and/or your care-partner.

## 2016-03-30 NOTE — Op Note (Signed)
[facility] Patient Name: Gloria Rush Procedure Date: 03/30/2016 2:16 PM MRN: [id number] Endoscopist: Jerene Bears , MD Age: 41 Referring MD:  Date of Birth: 1975-06-20 Gender: Female Account #: [id number] Procedure:                Upper GI endoscopy Indications:              Gastro-esophageal reflux disease, Hereditary                            nonpolyposis colorectal cancer (Lynch Syndrome)                            diagnosed by blood/genetic testing Medicines:                Monitored Anesthesia Care Procedure:                Pre-Anesthesia Assessment:                           - Prior to the procedure, a History and Physical                            was performed, and patient medications and                            allergies were reviewed. The patient's tolerance of                            previous anesthesia was also reviewed. The risks                            and benefits of the procedure and the sedation                            options and risks were discussed with the patient.                            All questions were answered, and informed consent                            was obtained. Prior Anticoagulants: The patient has                            taken no previous anticoagulant or antiplatelet                            agents. ASA Grade Assessment: II - A patient with                            mild systemic disease. After reviewing the risks                            and benefits, the patient was deemed in  satisfactory condition to undergo the procedure.                           After obtaining informed consent, the endoscope was                            passed under direct vision. Throughout the                            procedure, the patient's blood pressure, pulse, and                            oxygen saturations were monitored continuously. The                            Endoscope was introduced  through the mouth, and                            advanced to the third part of duodenum. The upper                            GI endoscopy was accomplished without difficulty.                            The patient tolerated the procedure well. Scope In: Scope Out: Findings:                 Normal mucosa was found in the entire esophagus. GE                            junction is patulous.                           A 2 cm hiatal hernia was present.                           The entire examined stomach was normal. Biopsies                            were taken with a cold forceps for histology and                            Helicobacter pylori testing.                           The examined duodenum was normal. Complications:            No immediate complications. Estimated Blood Loss:     Estimated blood loss was minimal. Impression:               - Normal mucosa was found in the entire esophagus.                           - 2 cm hiatal hernia.                           -  Normal stomach. Biopsied.                           - Normal examined duodenum. Recommendation:           - Patient has a contact number available for                            emergencies. The signs and symptoms of potential                            delayed complications were discussed with the                            patient. Return to normal activities tomorrow.                            Written discharge instructions were provided to the                            patient.                           - Resume previous diet.                           - Continue present medications.                           - Await pathology results.                           - Consider repeat upper endoscopy in 3 years for                            screening purposes given Lynch syndrome.                           - Proceed to screening colonoscopy. Jerene Bears, MD 03/30/2016 2:51:37 PM This report has been signed  electronically.

## 2016-03-30 NOTE — Progress Notes (Signed)
Called to room to assist during endoscopic procedure.  Patient ID and intended procedure confirmed with present staff. Received instructions for my participation in the procedure from the performing physician.  

## 2016-03-30 NOTE — Op Note (Signed)
[facility] Patient Name: Gloria Rush Procedure Date: 03/30/2016 2:16 PM MRN: [id number] Endoscopist: Jerene Bears , MD Age: 41 Referring MD:  Date of Birth: 11/16/1975 Gender: Female Account #: [id number] Procedure:                Colonoscopy Indications:              High risk colon cancer screening: Personal history                            of hereditary nonpolyposis colorectal cancer (Lynch                            Syndrome), no previous colonoscopy Medicines:                Monitored Anesthesia Care Procedure:                Pre-Anesthesia Assessment:                           - Prior to the procedure, a History and Physical                            was performed, and patient medications and                            allergies were reviewed. The patient's tolerance of                            previous anesthesia was also reviewed. The risks                            and benefits of the procedure and the sedation                            options and risks were discussed with the patient.                            All questions were answered, and informed consent                            was obtained. Prior Anticoagulants: The patient has                            taken no previous anticoagulant or antiplatelet                            agents. ASA Grade Assessment: II - A patient with                            mild systemic disease. After reviewing the risks                            and benefits, the patient was deemed in  satisfactory condition to undergo the procedure.                           After obtaining informed consent, the colonoscope                            was passed under direct vision. Throughout the                            procedure, the patient's blood pressure, pulse, and                            oxygen saturations were monitored continuously. The                            Model PCF-H190DL  [id number]) scope was introduced                            through the anus and advanced to the the terminal                            ileum. The colonoscopy was performed without                            difficulty. The patient tolerated the procedure                            well. The quality of the bowel preparation was                            excellent. The terminal ileum, ileocecal valve,                            appendiceal orifice, and rectum were photographed. Scope In: 2:53:05 PM Scope Out: 3:05:08 PM Scope Withdrawal Time: 0 hours 9 minutes 3 seconds  Total Procedure Duration: 0 hours 12 minutes 3 seconds  Findings:                 The digital rectal exam was normal.                           The terminal ileum appeared normal.                           Retroflexion in the right colon was performed and                            was normal.                           Normal mucosa was found in the entire colon.                           Internal hemorrhoids were found during  retroflexion. The hemorrhoids were small. Complications:            No immediate complications. Estimated Blood Loss:     Estimated blood loss: none. Impression:               - The examined portion of the ileum was normal.                           - Normal mucosa in the entire examined colon.                           - No polyps found.                           - Internal hemorrhoids. Recommendation:           - Patient has a contact number available for                            emergencies. The signs and symptoms of potential                            delayed complications were discussed with the                            patient. Return to normal activities tomorrow.                            Written discharge instructions were provided to the                            patient.                           - Resume previous diet.                           -  Continue present medications.                           - Repeat colonoscopy in 1 year for screening                            purposes. Jerene Bears, MD 03/30/2016 3:11:02 PM This report has been signed electronically.

## 2016-03-31 ENCOUNTER — Telehealth: Payer: Self-pay

## 2016-03-31 ENCOUNTER — Other Ambulatory Visit: Payer: Self-pay

## 2016-03-31 DIAGNOSIS — K219 Gastro-esophageal reflux disease without esophagitis: Secondary | ICD-10-CM

## 2016-03-31 MED ORDER — ESOMEPRAZOLE MAGNESIUM 40 MG PO CPDR: 40.0000 mg | DELAYED_RELEASE_CAPSULE | Freq: Every day | ORAL | 5 refills | Status: DC

## 2016-03-31 MED ORDER — ESOMEPRAZOLE MAGNESIUM 40 MG PO CPDR: 40.0000 mg | DELAYED_RELEASE_CAPSULE | Freq: Two times a day (BID) | ORAL | 0 refills | Status: DC

## 2016-03-31 NOTE — Telephone Encounter (Signed)
Entered in error

## 2016-03-31 NOTE — Telephone Encounter (Signed)
  Follow up Call-  Call back number 03/30/2016  Post procedure Call Back phone  # [phone]  Permission to leave phone message Yes  Some recent data might be hidden     Patient questions:  Do you have a fever, pain , or abdominal swelling? No. Pain Score  0 *  Have you tolerated food without any problems? Yes.    Have you been able to return to your normal activities? Yes.    Do you have any questions about your discharge instructions: Diet   No. Medications  Yes.   Follow up visit  No.  Do you have questions or concerns about your Care? No.  Actions: * If pain score is 4 or above: No action needed, pain <4.

## 2016-04-01 ENCOUNTER — Encounter: Payer: Self-pay | Admitting: Internal Medicine

## 2016-04-02 ENCOUNTER — Encounter: Payer: Self-pay | Admitting: Obstetrics and Gynecology

## 2016-04-05 ENCOUNTER — Other Ambulatory Visit: Payer: Self-pay | Admitting: Obstetrics and Gynecology

## 2016-04-05 DIAGNOSIS — D281 Benign neoplasm of vagina: Secondary | ICD-10-CM

## 2016-04-07 ENCOUNTER — Encounter: Payer: Self-pay | Admitting: Internal Medicine

## 2016-04-07 NOTE — Telephone Encounter (Signed)
These procedures were SCREENING, high risk, due to hx of Lynch She was not having symptoms and thus they were screening/preventative

## 2016-04-08 ENCOUNTER — Ambulatory Visit: Payer: Self-pay | Admitting: Obstetrics and Gynecology

## 2016-04-08 ENCOUNTER — Other Ambulatory Visit: Payer: Self-pay

## 2016-04-16 ENCOUNTER — Encounter: Payer: Self-pay | Admitting: Obstetrics and Gynecology

## 2016-04-16 ENCOUNTER — Ambulatory Visit (INDEPENDENT_AMBULATORY_CARE_PROVIDER_SITE_OTHER): Payer: BLUE CROSS/BLUE SHIELD

## 2016-04-16 DIAGNOSIS — D281 Benign neoplasm of vagina: Secondary | ICD-10-CM

## 2016-04-28 ENCOUNTER — Encounter: Payer: Self-pay | Admitting: Obstetrics and Gynecology

## 2016-04-28 ENCOUNTER — Encounter: Payer: Self-pay | Admitting: Family Medicine

## 2016-04-29 ENCOUNTER — Other Ambulatory Visit: Payer: Self-pay | Admitting: Internal Medicine

## 2016-04-29 DIAGNOSIS — K219 Gastro-esophageal reflux disease without esophagitis: Secondary | ICD-10-CM

## 2016-04-30 ENCOUNTER — Encounter: Payer: Self-pay | Admitting: Family Medicine

## 2016-05-02 ENCOUNTER — Other Ambulatory Visit: Payer: Self-pay | Admitting: Family Medicine

## 2016-05-02 MED ORDER — SULFAMETHOXAZOLE-TRIMETHOPRIM 800-160 MG PO TABS: 1.0000 | ORAL_TABLET | Freq: Two times a day (BID) | ORAL | 0 refills | Status: AC

## 2016-05-02 NOTE — Telephone Encounter (Signed)
Called and LMOM.  Sent in septra x 7 d to pharmacy for the patient.

## 2016-05-03 ENCOUNTER — Encounter: Payer: Self-pay | Admitting: Internal Medicine

## 2016-05-03 ENCOUNTER — Ambulatory Visit: Payer: Self-pay | Admitting: Family Medicine

## 2016-05-03 ENCOUNTER — Other Ambulatory Visit: Payer: Self-pay | Admitting: Internal Medicine

## 2016-05-03 ENCOUNTER — Encounter: Payer: Self-pay | Admitting: Family Medicine

## 2016-05-03 ENCOUNTER — Ambulatory Visit (INDEPENDENT_AMBULATORY_CARE_PROVIDER_SITE_OTHER): Payer: BLUE CROSS/BLUE SHIELD | Admitting: Family Medicine

## 2016-05-03 VITALS — BP 90/60 | HR 87 | Temp 98.5°F | Ht 65.0 in | Wt 120.2 lb

## 2016-05-03 DIAGNOSIS — M545 Low back pain, unspecified: Secondary | ICD-10-CM

## 2016-05-03 DIAGNOSIS — B349 Viral infection, unspecified: Secondary | ICD-10-CM

## 2016-05-03 DIAGNOSIS — N949 Unspecified condition associated with female genital organs and menstrual cycle: Secondary | ICD-10-CM | POA: Diagnosis not present

## 2016-05-03 DIAGNOSIS — K219 Gastro-esophageal reflux disease without esophagitis: Secondary | ICD-10-CM

## 2016-05-03 LAB — POCT WET + KOH PREP
TRICH BY WET PREP: ABSENT
Yeast by KOH: ABSENT
Yeast by wet prep: ABSENT

## 2016-05-03 LAB — POC URINALSYSI DIPSTICK (AUTOMATED)
Bilirubin, UA: NEGATIVE
GLUCOSE UA: NEGATIVE
KETONES UA: NEGATIVE
Leukocytes, UA: NEGATIVE
Nitrite, UA: NEGATIVE
PROTEIN UA: NEGATIVE
RBC UA: NEGATIVE
SPEC GRAV UA: 1.025 (ref 1.010–1.025)
UROBILINOGEN UA: 0.2 U/dL
pH, UA: 6 (ref 5.0–8.0)

## 2016-05-03 NOTE — Telephone Encounter (Signed)
I would rather have her be seen and examined

## 2016-05-03 NOTE — Telephone Encounter (Signed)
Appointment moved to Dr. Lillie Fragmin scheduled today at 2:00 pm.  Gloria Rush notified by telephone.

## 2016-05-03 NOTE — Progress Notes (Signed)
Dr. Frederico Hamman T. Keerstin Bjelland, MD, [facility] [place], [address] Phone: [phone] Fax: [phone]  05/03/2016  Patient: Gloria Rush, MRN: [id number], DOB: July 14, 1975, 41 y.o.  Primary Physician:  Owens Loffler, MD   Chief Complaint  Patient presents with  . Back Pain  . Fatigue  . Burning Sensation   Subjective:   Gloria Rush is a 41 y.o. very pleasant female patient who presents with the following:  Feels crampy and burning some Some back pain Does not feel good all over.  Patient felt like she was having some dysuria and burning in general in her private region.  She is not having any discharge.  She is not having any nausea, vomiting, or diarrhea.  She did do a home UA which was positive per her report, but she repeated this 2 more times, and it was negative.  I'm not sure exactly what this test was, but is available over-the-counter.  Wt Readings from Last 3 Encounters:  05/03/16 120 lb 4 oz (54.5 kg)  03/30/16 116 lb (52.6 kg)  02/17/16 116 lb (52.6 kg)    UA neg Wet prep  Past Medical History, Surgical History, Social History, Family History, Problem List, Medications, and Allergies have been reviewed and updated if relevant.  Patient Active Problem List   Diagnosis Date Noted  . Lynch syndrome 04/13/2013    Priority: High  . Major depressive disorder, recurrent, in full remission ([place]) 12/18/2009    Priority: Medium  . Panic disorder without agoraphobia with panic attacks in partial remission 01/23/2008    Priority: Medium  . Asthma, moderate persistent, well-controlled 12/27/2006    Priority: Medium  . Unspecified vitamin D deficiency 08/24/2012  . ALLERGIC CONJUNCTIVITIS 12/23/2008  . DERMATITIS, ATOPIC 12/23/2008  . GERD 12/05/2007  . Allergic rhinitis 12/27/2006    Past Medical History:  Diagnosis Date  . Allergy   . Anxiety   . Asthma   . Lynch syndrome   . Panic disorder       Past Surgical History:  Procedure Laterality Date  . WISDOM TOOTH EXTRACTION      Social History   Social History  . Marital status: Married    Spouse name: N/A  . Number of children: N/A  . Years of education: N/A   Occupational History  . Not on file.   Social History Main Topics  . Smoking status: Never Smoker  . Smokeless tobacco: Never Used  . Alcohol use No  . Drug use: No  . Sexual activity: Yes    Partners: Male   Other Topics Concern  . Not on file   Social History Narrative  . No narrative on file    Family History  Problem Relation Age of Onset  . Anxiety disorder Mother   . Anxiety disorder Brother   . Diabetes Maternal Grandfather   . Breast cancer Maternal Grandmother 38  . Breast cancer Maternal Aunt 70  . Colon cancer Neg Hx   . Stomach cancer Neg Hx     Allergies  Allergen Reactions  . Amoxicillin     Itching (once), no rash Rapid heart rate  . Chicken Meat (Diagnostic) Other (See Comments)    Throat feels tight and GERD  . Dexilant [Dexlansoprazole] Other (See Comments)    Throat feels tight    Medication list reviewed and updated in full in [place].  ROS: GEN: Acute illness details above GI: Tolerating  PO intake GU: maintaining adequate hydration and urination Pulm: No SOB Interactive and getting along well at home.  Otherwise, ROS is as per the HPI.  Objective:   BP 90/60   Pulse 87   Temp 98.5 F (36.9 C) (Oral)   Ht 5\' 5"  (1.651 m)   Wt 120 lb 4 oz (54.5 kg)   LMP 04/23/2016 (Approximate)   BMI 20.01 kg/m    GEN: WDWN, NAD, Non-toxic, A & O x 3 HEENT: Atraumatic, Normocephalic. Neck supple. No masses, No LAD. Ears and Nose: No external deformity. CV: RRR, No M/G/R. No JVD. No thrill. No extra heart sounds. PULM: CTA B, no wheezes, crackles, rhonchi. No retractions. No resp. distress. No accessory muscle use. ABD: S, mild/minimal hypogastric discomfort, ND, + BS, No rebound, No HSM  No CVAT Modest  bilateral erector spinae complex tenderness around L3-L5 bilaterally. EXTR: No c/c/e NEURO Normal gait.  PSYCH: Normally interactive. Conversant. Not depressed or anxious appearing.  Calm demeanor.     Laboratory and Imaging Data: Results for orders placed or performed in visit on 05/03/16  POCT Urinalysis Dipstick (Automated)  Result Value Ref Range   Color, UA yellow    Clarity, UA clear    Glucose, UA negative    Bilirubin, UA negative    Ketones, UA negative    Spec Grav, UA 1.025 1.010 - 1.025   Blood, UA negative    pH, UA 6.0 5.0 - 8.0   Protein, UA negative    Urobilinogen, UA 0.2 0.2 or 1.0 E.U./dL   Nitrite, UA negative    Leukocytes, UA Negative Negative  POCT Wet + KOH Prep  Result Value Ref Range   Yeast by KOH Absent Absent   Yeast by wet prep Absent Absent   WBC by wet prep None (A) Few   Clue Cells Wet Prep HPF POC None None   Trich by wet prep Absent Absent   Bacteria Wet Prep HPF POC Few Few   Epithelial Cells By [employer] ([facility]) Few None, Few, Too numerous to count   RBC,UR,HPF,POC None None RBC/hpf     Assessment and Plan:   Viral syndrome  Acute bilateral low back pain without sciatica - Plan: POCT Urinalysis Dipstick (Automated), Urine culture  Vaginal burning - Plan: POCT Wet + KOH Prep  Both UA and wet prep are totally normal.  I will check a urine culture to be sure given the patient's symptoms, but likely this is a viral illness.  Supportive care.  Follow-up: No Follow-up on file.  Meds ordered this encounter  Medications  . tranexamic acid (LYSTEDA) 650 MG TABS tablet    Sig: TAKE 2 TABLETS BY MOUTH 3 TIMES DAILY DURING MENSES FOR A MAXIMUM OF 5 DAYS    Refill:  0   Medications Discontinued During This Encounter  Medication Reason  . esomeprazole (NEXIUM) 40 MG capsule Duplicate   Orders Placed This Encounter  Procedures  . Urine culture  . POCT Urinalysis Dipstick (Automated)  . POCT Wet + KOH Prep    Signed,  Lynzie Cliburn T.  Tama Grosz, MD   Allergies as of 05/03/2016      Reactions   Amoxicillin    Itching (once), no rash Rapid heart rate   Chicken Meat (diagnostic) Other (See Comments)   Throat feels tight and GERD   Dexilant [dexlansoprazole] Other (See Comments)   Throat feels tight      Medication List       Accurate as of 05/03/16  11:59 PM. Always use your most recent med list.          albuterol (2.5 MG/3ML) 0.083% nebulizer solution Commonly known as:  PROVENTIL USE ONE VIAL VIA NEBULIZER EVERY 4 HOURS AS NEEDED FOR SHORTNESS OF BREATH   VENTOLIN HFA 108 (90 Base) MCG/ACT inhaler Generic drug:  albuterol USE 2 PUFFS EVERY 4 HOURS AS NEEDED FOR WHEEZING   diphenhydrAMINE 25 MG tablet Commonly known as:  BENADRYL Take 25 mg by mouth every 6 (six) hours as needed.   escitalopram 20 MG tablet Commonly known as:  LEXAPRO TAKE 1/2 TABLET BY MOUTH DAILY.   esomeprazole 40 MG capsule Commonly known as:  NEXIUM Take 1 capsule (40 mg total) by mouth daily. 30 mins before breakfast   FLOVENT HFA 110 MCG/ACT inhaler Generic drug:  fluticasone   mometasone 220 MCG/INH inhaler Commonly known as:  ASMANEX 60 METERED DOSES Inhale 1 puff into the lungs 2 (two) times daily.   mupirocin ointment 2 % Commonly known as:  BACTROBAN Apply topically 3 (three) times daily.   nystatin 100000 UNIT/ML suspension Commonly known as:  MYCOSTATIN Swish and swallow 5 ml four times a day for 7 days   ranitidine 150 MG tablet Commonly known as:  ZANTAC 150 MAXIMUM STRENGTH Take 1 tablet (150 mg total) by mouth every evening.   sulfamethoxazole-trimethoprim 800-160 MG tablet Commonly known as:  BACTRIM DS,SEPTRA DS Take 1 tablet by mouth 2 (two) times daily.   tranexamic acid 650 MG Tabs tablet Commonly known as:  LYSTEDA TAKE 2 TABLETS BY MOUTH 3 TIMES DAILY DURING MENSES FOR A MAXIMUM OF 5 DAYS

## 2016-05-03 NOTE — Progress Notes (Signed)
Pre visit review using our clinic review tool, if applicable. No additional management support is needed unless otherwise documented below in the visit note. 

## 2016-05-04 LAB — URINE CULTURE: Organism ID, Bacteria: NO GROWTH

## 2016-05-13 ENCOUNTER — Encounter: Payer: Self-pay | Admitting: Internal Medicine

## 2016-06-10 ENCOUNTER — Encounter: Payer: Self-pay | Admitting: Obstetrics and Gynecology

## 2016-06-11 ENCOUNTER — Encounter: Payer: Self-pay | Admitting: Obstetrics and Gynecology

## 2016-06-11 ENCOUNTER — Telehealth: Payer: Self-pay | Admitting: Obstetrics and Gynecology

## 2016-06-11 ENCOUNTER — Ambulatory Visit (INDEPENDENT_AMBULATORY_CARE_PROVIDER_SITE_OTHER): Payer: BLUE CROSS/BLUE SHIELD | Admitting: Obstetrics and Gynecology

## 2016-06-11 VITALS — BP 102/62 | HR 93 | Ht 64.0 in | Wt 120.0 lb

## 2016-06-11 DIAGNOSIS — Z1231 Encounter for screening mammogram for malignant neoplasm of breast: Secondary | ICD-10-CM | POA: Diagnosis not present

## 2016-06-11 DIAGNOSIS — Z13220 Encounter for screening for lipoid disorders: Secondary | ICD-10-CM

## 2016-06-11 DIAGNOSIS — Z1509 Genetic susceptibility to other malignant neoplasm: Secondary | ICD-10-CM

## 2016-06-11 DIAGNOSIS — Z01419 Encounter for gynecological examination (general) (routine) without abnormal findings: Secondary | ICD-10-CM

## 2016-06-11 DIAGNOSIS — Z1329 Encounter for screening for other suspected endocrine disorder: Secondary | ICD-10-CM

## 2016-06-11 DIAGNOSIS — Z803 Family history of malignant neoplasm of breast: Secondary | ICD-10-CM | POA: Diagnosis not present

## 2016-06-11 DIAGNOSIS — Z9189 Other specified personal risk factors, not elsewhere classified: Secondary | ICD-10-CM | POA: Diagnosis not present

## 2016-06-11 DIAGNOSIS — N920 Excessive and frequent menstruation with regular cycle: Secondary | ICD-10-CM | POA: Insufficient documentation

## 2016-06-11 DIAGNOSIS — Z1239 Encounter for other screening for malignant neoplasm of breast: Secondary | ICD-10-CM

## 2016-06-11 DIAGNOSIS — Z0000 Encounter for general adult medical examination without abnormal findings: Secondary | ICD-10-CM

## 2016-06-11 DIAGNOSIS — F3281 Premenstrual dysphoric disorder: Secondary | ICD-10-CM

## 2016-06-11 MED ORDER — ALPRAZOLAM 0.25 MG PO TABS: 0.2500 mg | ORAL_TABLET | Freq: Two times a day (BID) | ORAL | 0 refills | Status: DC | PRN

## 2016-06-11 NOTE — Progress Notes (Signed)
Chief Complaint  Patient presents with  . Gynecologic Exam     HPI:      Gloria Rush is a 41 y.o. G0P0000 who LMP was Patient's last menstrual period was 05/18/2016 (exact date)., presents today for her annual examination.  Her menses are regular every 28-30 days, lasting 7 days, heavy for 2 days. She changes pads about every 30-60 min, with quarter sized clots. She was mildly anemic 11/17.  Dysmenorrhea mild, occurring first 1-2 days of flow. She does have intermenstrual bleeding with ovulation. She tried lysteda for menorrhagia with good sx relief, but pt has asthma and NSAIDs cause chest tightness for her. She then tried 200 mg ibup occasionally during her cycle with sx improvement of flow but less asthma sx. She has also noticed increased anxiety/agitation with her menses. She is already on lexapro but was interested in an occasional med to calm her down.   Sex activity: single partner, contraception - none. Hx of infertility.  Last Pap: February 05, 2015  Results were: no abnormalities /neg HPV DNA   Last mammogram: February 05, 2015  Results were: normal--routine follow-up in 12 months There is a FH of breast cancer in her MGM and mat aunt. There is no FH of ovarian cancer. The patient does do self-breast exams. The patient is BRCA neg but Lynch pos. IBIS=20.1% with 2014.  Tobacco use: The patient denies current or previous tobacco use. Alcohol use: none Exercise: not active  She does get adequate calcium and Vitamin D in her diet.  She is Lynch pos (PMS2). Per NCCN guidelines, pt is due for yearly EMB (pt wants to sched for another day), yearly u/s (done 4/18, EM=7 mm) and ca-125 due to endometrial and ovarian cancer risk since not interested in hyst. She is due for yearly colonoscopy/upper GI. Pt had done with Dr. Hilarie Fredrickson 3/18. She had a neg urine check 4/18 with PCP.  I received notification from [employer] (who did Lynch testing) 2/18 asking for another specimen  from pt, if she is amenable. Pt has a rare genetic mutation and they wanted to do further testing. This is no charge to pt, and she is amenable to submitting it today.    Past Medical History:  Diagnosis Date  . Allergy   . Anxiety   . Asthma   . Fibroid   . Genetic testing of female    MyRisk Positive  . Infertility, female   . Lynch syndrome    PMS2 pos 11/14  . Panic disorder     Past Surgical History:  Procedure Laterality Date  . WISDOM TOOTH EXTRACTION      Family History  Problem Relation Age of Onset  . Anxiety disorder Mother   . Anxiety disorder Brother   . Diabetes Maternal Grandfather   . Breast cancer Maternal Grandmother 69  . Breast cancer Maternal Aunt 70  . Colon cancer Neg Hx   . Stomach cancer Neg Hx     Social History   Social History  . Marital status: Married    Spouse name: N/A  . Number of children: N/A  . Years of education: N/A   Occupational History  . Not on file.   Social History Main Topics  . Smoking status: Never Smoker  . Smokeless tobacco: Never Used  . Alcohol use No  . Drug use: No  . Sexual activity: Yes    Partners: Male    Birth control/ protection: None   Other  Topics Concern  . Not on file   Social History Narrative  . No narrative on file     Current Outpatient Prescriptions:  .  albuterol (PROVENTIL) (2.5 MG/3ML) 0.083% nebulizer solution, USE ONE VIAL VIA NEBULIZER EVERY 4 HOURS AS NEEDED FOR SHORTNESS OF BREATH, Disp: , Rfl: 0 .  diphenhydrAMINE (BENADRYL) 25 MG tablet, Take 25 mg by mouth every 6 (six) hours as needed., Disp: , Rfl:  .  escitalopram (LEXAPRO) 20 MG tablet, TAKE 1/2 TABLET BY MOUTH DAILY., Disp: 30 tablet, Rfl: 4 .  esomeprazole (NEXIUM) 40 MG capsule, Take 1 capsule (40 mg total) by mouth daily. 30 mins before breakfast, Disp: 30 capsule, Rfl: 5 .  FLOVENT HFA 110 MCG/ACT inhaler, , Disp: , Rfl: 11 .  mometasone (ASMANEX 60 METERED DOSES) 220 MCG/INH inhaler, Inhale 1 puff into the lungs  2 (two) times daily., Disp: 1 Inhaler, Rfl: 11 .  mupirocin ointment (BACTROBAN) 2 %, Apply topically 3 (three) times daily., Disp: 22 g, Rfl: 2 .  nystatin (MYCOSTATIN) 100000 UNIT/ML suspension, Swish and swallow 5 ml four times a day for 7 days, Disp: 200 mL, Rfl: 0 .  tranexamic acid (LYSTEDA) 650 MG TABS tablet, TAKE 2 TABLETS BY MOUTH 3 TIMES DAILY DURING MENSES FOR A MAXIMUM OF 5 DAYS, Disp: , Rfl: 0 .  VENTOLIN HFA 108 (90 Base) MCG/ACT inhaler, USE 2 PUFFS EVERY 4 HOURS AS NEEDED FOR WHEEZING, Disp: 3 Inhaler, Rfl: 2  Current Facility-Administered Medications:  .  0.9 %  sodium chloride infusion, 500 mL, Intravenous, Continuous, Pyrtle, Lajuan Lines, MD  ROS:  Review of Systems  Constitutional: Negative for fatigue, fever and unexpected weight change.  Respiratory: Negative for cough, shortness of breath and wheezing.   Cardiovascular: Negative for chest pain, palpitations and leg swelling.  Gastrointestinal: Negative for blood in stool, constipation, diarrhea, nausea and vomiting.  Endocrine: Negative for cold intolerance, heat intolerance and polyuria.  Genitourinary: Positive for frequency. Negative for dyspareunia, dysuria, flank pain, genital sores, hematuria, menstrual problem, pelvic pain, urgency, vaginal bleeding, vaginal discharge and vaginal pain.  Musculoskeletal: Negative for back pain, joint swelling and myalgias.  Skin: Negative for rash.  Neurological: Negative for dizziness, syncope, light-headedness, numbness and headaches.  Hematological: Negative for adenopathy.  Psychiatric/Behavioral: Negative for agitation, confusion, sleep disturbance and suicidal ideas. The patient is not nervous/anxious.      Objective: BP 102/62   Pulse 93   Ht _0  (1.626 m)   Wt 120 lb (54.4 kg)   LMP 05/18/2016 (Exact Date)   BMI 20.60 kg/m    Physical Exam  Constitutional: She is oriented to person, place, and time. She appears well-developed and well-nourished.  Genitourinary:  Vagina normal and uterus normal. There is no rash or tenderness on the right labia. There is no rash or tenderness on the left labia. No erythema or tenderness in the vagina. No vaginal discharge found. Right adnexum does not display mass and does not display tenderness. Left adnexum does not display mass and does not display tenderness. Cervix does not exhibit motion tenderness or polyp. Uterus is not enlarged or tender.  Neck: Normal range of motion. No thyromegaly present.  Cardiovascular: Normal rate, regular rhythm and normal heart sounds.   No murmur heard. Pulmonary/Chest: Effort normal and breath sounds normal. Right breast exhibits no mass, no nipple discharge, no skin change and no tenderness. Left breast exhibits no mass, no nipple discharge, no skin change and no tenderness.  Abdominal: Soft. There is no  tenderness. There is no guarding.  Musculoskeletal: Normal range of motion.  Neurological: She is alert and oriented to person, place, and time. No cranial nerve deficit.  Psychiatric: She has a normal mood and affect. Her behavior is normal.  Vitals reviewed.   Assessment/Plan: Encounter for annual routine gynecological examination  Screening for breast cancer - Pt to sched 3D mammo at [facility].  Family history of breast cancer - Plan: MM SCREENING BREAST TOMO BILATERAL  Increased risk of breast cancer - BRCA Neg. IBIS=20%. Pt to cont monthly SBE, yearly CBE and mammos. Add Vit D3 5000 IU daily. Pt qualifies for scr breast MRI if interested.  Blood tests for routine general physical examination - Plan: CBC with Differential/Platelet, Lipid panel, TSH + free T4, CA 125  Screening cholesterol level - Plan: Lipid panel  Thyroid disorder screening - Hx of stable thyroid nodules in the past.  - Plan: TSH + free T4  Lynch syndrome - Pt is doing recommended screening. She is due for EMB and will RTO for procedure. My Risk specimen collected for further testing. Will f/u with results. -  Plan: CA 125, Integrated BRAC[employer]  Menorrhagia with regular cycle - Check labs. Pt can't tolerate lysteda. Will cont with occas ibup. Also discussed hyst which is recommended due to [facility]. Pt wants to preserve fertility. - Plan: CBC with Differential/Platelet  PMDD (premenstrual dysphoric disorder) - Pt on lexapro. Try Rx xanax occas before menses. Pt aware of addictive potential and to use sparingly. F/u prn.             GYN counsel breast self exam, mammography screening, adequate intake of calcium and vitamin D, diet and exercise     F/U  Return in about 1 week (around 06/18/2016) for EMB with [facility].  Kyland No B. Abijah Roussel, PA-C 06/11/2016 12:02 PM

## 2016-06-11 NOTE — Telephone Encounter (Signed)
Spoke with Jeannie Done at [employer] about submitting blood sample for further genetic testing research. Sample to go out today, they will use it for investigational studies only and not change dx, so we may never hear anything back from them since it won't affect pt care.

## 2016-06-12 LAB — CBC WITH DIFFERENTIAL/PLATELET
BASOS: 1 %
Basophils Absolute: 0.1 10*3/uL (ref 0.0–0.2)
EOS (ABSOLUTE): 0.3 10*3/uL (ref 0.0–0.4)
EOS: 4 %
HEMATOCRIT: 37.1 % (ref 34.0–46.6)
Hemoglobin: 11.9 g/dL (ref 11.1–15.9)
IMMATURE GRANULOCYTES: 0 %
Immature Grans (Abs): 0 10*3/uL (ref 0.0–0.1)
LYMPHS: 30 %
Lymphocytes Absolute: 1.7 10*3/uL (ref 0.7–3.1)
MCH: 28.2 pg (ref 26.6–33.0)
MCHC: 32.1 g/dL (ref 31.5–35.7)
MCV: 88 fL (ref 79–97)
MONOS ABS: 0.4 10*3/uL (ref 0.1–0.9)
Monocytes: 7 %
NEUTROS ABS: 3.3 10*3/uL (ref 1.4–7.0)
NEUTROS PCT: 58 %
Platelets: 222 10*3/uL (ref 150–379)
RBC: 4.22 x10E6/uL (ref 3.77–5.28)
RDW: 14 % (ref 12.3–15.4)
WBC: 5.7 10*3/uL (ref 3.4–10.8)

## 2016-06-12 LAB — CA 125: CA 125: 11.2 U/mL (ref 0.0–38.1)

## 2016-06-12 LAB — TSH+FREE T4
Free T4: 1 ng/dL (ref 0.82–1.77)
TSH: 0.218 u[IU]/mL — ABNORMAL LOW (ref 0.450–4.500)

## 2016-06-13 ENCOUNTER — Encounter: Payer: Self-pay | Admitting: Obstetrics and Gynecology

## 2016-06-14 ENCOUNTER — Encounter: Payer: Self-pay | Admitting: Obstetrics and Gynecology

## 2016-06-16 ENCOUNTER — Encounter: Payer: Self-pay | Admitting: Obstetrics and Gynecology

## 2016-06-24 ENCOUNTER — Encounter: Payer: Self-pay | Admitting: Family Medicine

## 2016-07-16 ENCOUNTER — Encounter: Payer: Self-pay | Admitting: Family Medicine

## 2016-07-20 ENCOUNTER — Encounter: Payer: Self-pay | Admitting: Obstetrics and Gynecology

## 2016-07-28 ENCOUNTER — Encounter: Payer: Self-pay | Admitting: Obstetrics and Gynecology

## 2016-07-29 ENCOUNTER — Encounter: Payer: BLUE CROSS/BLUE SHIELD | Admitting: Family Medicine

## 2016-07-30 ENCOUNTER — Encounter: Payer: Self-pay | Admitting: Family Medicine

## 2016-07-30 ENCOUNTER — Ambulatory Visit (INDEPENDENT_AMBULATORY_CARE_PROVIDER_SITE_OTHER): Payer: BLUE CROSS/BLUE SHIELD | Admitting: Family Medicine

## 2016-07-30 DIAGNOSIS — R0982 Postnasal drip: Secondary | ICD-10-CM | POA: Diagnosis not present

## 2016-07-30 NOTE — Patient Instructions (Signed)
Rest and fluids, nasal saline, then flonase if needed. Don't use afrin.   Take care.  Glad to see you.

## 2016-07-30 NOTE — Progress Notes (Signed)
Sx for the last two days.  She didn't know it was from allergies.  Some posterior HA.  Some maxillary pressure.  Taste is affected, she thought she was having some post nasal gtt.  No fevers.  No chills.  No vomiting. No diarrhea.    Meds, vitals, and allergies reviewed.   ROS: Per HPI unless specifically indicated in ROS section   GEN: nad, alert and oriented HEENT: mucous membranes moist, tm w/o erythema, nasal exam w/o erythema, clear discharge noted,  OP with cobblestoning, sinuses not ttp NECK: supple w/o LA CV: rrr.   PULM: ctab, no inc wob EXT: no edema

## 2016-08-01 DIAGNOSIS — R0982 Postnasal drip: Secondary | ICD-10-CM | POA: Insufficient documentation

## 2016-08-01 NOTE — Assessment & Plan Note (Signed)
Rest and fluids, nasal saline, then flonase if needed. Don't use afrin. Nontoxic. No indication for antibiotics. Update [place] as needed. She agrees.

## 2016-08-12 ENCOUNTER — Encounter: Payer: Self-pay | Admitting: Family Medicine

## 2016-08-12 ENCOUNTER — Telehealth: Payer: Self-pay

## 2016-08-12 NOTE — Telephone Encounter (Signed)
Spoke to patient. Gave instructions. Patient verbalized understanding.  

## 2016-08-12 NOTE — Telephone Encounter (Signed)
I think she should be fine. She could keep some Benadryl on hand in case she feels anything strange.

## 2016-08-12 NOTE — Telephone Encounter (Signed)
Called pt in response to MyChart message sent.  Advised would fwd message to Dr. Lorelei Pont, just checking to see how she is doing currently. Pt says she does not feel any different than she would if she had not known there was chicken in the pepperoni at the moment, just wants confirmation from Dr. Lorelei Pont that she will be ok.

## 2016-08-12 NOTE — Telephone Encounter (Signed)
Caller Name:Varshini Juene Relationship to Patient:self Best [phone] Pharmacy:  Reason for call: pt returned call, and would really like one of the doctors to call back

## 2016-08-18 ENCOUNTER — Encounter: Payer: Self-pay | Admitting: Obstetrics and Gynecology

## 2016-08-20 ENCOUNTER — Encounter: Payer: Self-pay | Admitting: Family Medicine

## 2016-08-20 DIAGNOSIS — J4540 Moderate persistent asthma, uncomplicated: Secondary | ICD-10-CM

## 2016-09-02 ENCOUNTER — Encounter: Payer: Self-pay | Admitting: Obstetrics and Gynecology

## 2016-09-20 ENCOUNTER — Encounter: Payer: Self-pay | Admitting: Obstetrics and Gynecology

## 2016-10-17 ENCOUNTER — Encounter: Payer: Self-pay | Admitting: Family Medicine

## 2016-10-18 MED ORDER — NYSTATIN 100000 UNIT/ML MT SUSP: OROMUCOSAL | 2 refills | Status: DC

## 2016-10-19 ENCOUNTER — Other Ambulatory Visit: Payer: Self-pay | Admitting: Internal Medicine

## 2016-11-25 ENCOUNTER — Encounter: Payer: Self-pay | Admitting: Family Medicine

## 2016-11-26 ENCOUNTER — Ambulatory Visit (INDEPENDENT_AMBULATORY_CARE_PROVIDER_SITE_OTHER): Payer: BLUE CROSS/BLUE SHIELD

## 2016-11-26 ENCOUNTER — Ambulatory Visit: Payer: BLUE CROSS/BLUE SHIELD

## 2016-11-26 DIAGNOSIS — Z23 Encounter for immunization: Secondary | ICD-10-CM | POA: Diagnosis not present

## 2017-01-06 ENCOUNTER — Other Ambulatory Visit: Payer: Self-pay | Admitting: Family Medicine

## 2017-01-06 ENCOUNTER — Encounter: Payer: Self-pay | Admitting: Family Medicine

## 2017-01-06 MED ORDER — FLUTICASONE PROPIONATE HFA 220 MCG/ACT IN AERO: 2.0000 | INHALATION_SPRAY | Freq: Two times a day (BID) | RESPIRATORY_TRACT | 3 refills | Status: DC

## 2017-01-23 ENCOUNTER — Encounter: Payer: Self-pay | Admitting: Obstetrics and Gynecology

## 2017-01-28 ENCOUNTER — Encounter: Payer: Self-pay | Admitting: Family Medicine

## 2017-01-28 MED ORDER — LEVALBUTEROL TARTRATE 45 MCG/ACT IN AERO: 2.0000 | INHALATION_SPRAY | Freq: Four times a day (QID) | RESPIRATORY_TRACT | 12 refills | Status: DC | PRN

## 2017-02-20 ENCOUNTER — Encounter: Payer: Self-pay | Admitting: Family Medicine

## 2017-02-22 ENCOUNTER — Encounter: Payer: Self-pay | Admitting: Family Medicine

## 2017-02-22 ENCOUNTER — Telehealth: Payer: BLUE CROSS/BLUE SHIELD | Admitting: Nurse Practitioner

## 2017-02-22 DIAGNOSIS — L509 Urticaria, unspecified: Secondary | ICD-10-CM

## 2017-02-22 NOTE — Progress Notes (Signed)
E Visit for Rash  We are sorry that you are not feeling well. Here is how we plan to help!   I recommend you take Benadryl 25 mg - 50 mg every 4 hours to control the symptoms but if they last over 24 hours it is best that you see an office based provider for follow up.      HOME CARE:   Take cool showers and avoid direct sunlight.  Apply cool compress or wet dressings.  Take a bath in an oatmeal bath.  Sprinkle content of one Aveeno packet under running faucet with comfortably warm water.  Bathe for 15-20 minutes, 1-2 times daily.  Pat dry with a towel. Do not rub the rash.  Use hydrocortisone cream.  Take an antihistamine like Benadryl for widespread rashes that itch.  The adult dose of Benadryl is 25-50 mg by mouth 4 times daily.  Caution:  This type of medication may cause sleepiness.  Do not drink alcohol, drive, or operate dangerous machinery while taking antihistamines.  Do not take these medications if you have prostate enlargement.  Read package instructions thoroughly on all medications that you take.  GET HELP RIGHT AWAY IF:   Symptoms don't go away after treatment.  Severe itching that persists.  If you rash spreads or swells.  If you rash begins to smell.  If it blisters and opens or develops a yellow-brown crust.  You develop a fever.  You have a sore throat.  You become short of breath.  MAKE SURE YOU:  Understand these instructions. Will watch your condition. Will get help right away if you are not doing well or get worse.  Thank you for choosing an e-visit. Your e-visit answers were reviewed by a board certified advanced clinical practitioner to complete your personal care plan. Depending upon the condition, your plan could have included both over the counter or prescription medications. Please review your pharmacy choice. Be sure that the pharmacy you have chosen is open so that you can pick up your prescription now.  If there is a problem you may  message your provider in [place] to have the prescription routed to another pharmacy. Your safety is important to [place]. If you have drug allergies check your prescription carefully.  For the next 24 hours, you can use MyChart to ask questions about today's visit, request a non-urgent call back, or ask for a work or school excuse from your e-visit provider. You will get an email in the next two days asking about your experience. I hope that your e-visit has been valuable and will speed your recovery.

## 2017-02-25 ENCOUNTER — Encounter: Payer: Self-pay | Admitting: Family Medicine

## 2017-02-25 MED ORDER — ALBUTEROL SULFATE HFA 108 (90 BASE) MCG/ACT IN AERS: INHALATION_SPRAY | RESPIRATORY_TRACT | 2 refills | Status: DC

## 2017-03-02 MED ORDER — ESOMEPRAZOLE MAGNESIUM 40 MG PO CPDR: 40.0000 mg | DELAYED_RELEASE_CAPSULE | Freq: Every day | ORAL | 5 refills | Status: DC

## 2017-03-02 MED ORDER — ESCITALOPRAM OXALATE 20 MG PO TABS: 10.0000 mg | ORAL_TABLET | Freq: Every day | ORAL | 4 refills | Status: DC

## 2017-03-02 MED ORDER — ESCITALOPRAM OXALATE 20 MG PO TABS: 10.0000 mg | ORAL_TABLET | Freq: Every day | ORAL | 5 refills | Status: DC

## 2017-03-02 NOTE — Addendum Note (Signed)
Addended by: Carter Kitten on: 03/02/2017 02:27 PM   Modules accepted: Orders

## 2017-03-02 NOTE — Addendum Note (Signed)
Addended by: Carter Kitten on: 03/02/2017 04:24 PM   Modules accepted: Orders

## 2017-03-02 NOTE — Telephone Encounter (Signed)
Last office visit 07/30/2016 for post nasal drip with Dr. Damita Dunnings.  Last CPE?  Ok to refill?

## 2017-03-25 ENCOUNTER — Encounter: Payer: Self-pay | Admitting: Family Medicine

## 2017-03-29 ENCOUNTER — Telehealth: Payer: BLUE CROSS/BLUE SHIELD | Admitting: Family

## 2017-03-29 DIAGNOSIS — J019 Acute sinusitis, unspecified: Secondary | ICD-10-CM

## 2017-03-29 DIAGNOSIS — B9689 Other specified bacterial agents as the cause of diseases classified elsewhere: Secondary | ICD-10-CM

## 2017-03-29 MED ORDER — AZITHROMYCIN 250 MG PO TABS: 250.0000 mg | ORAL_TABLET | Freq: Every day | ORAL | 0 refills | Status: DC

## 2017-03-29 MED ORDER — DOXYCYCLINE HYCLATE 100 MG PO TABS: 100.0000 mg | ORAL_TABLET | Freq: Two times a day (BID) | ORAL | 0 refills | Status: DC

## 2017-03-29 NOTE — Addendum Note (Signed)
Addended by: Dutch Quint B on: 03/29/2017 11:35 AM   Modules accepted: Orders

## 2017-03-29 NOTE — Progress Notes (Signed)

## 2017-04-04 ENCOUNTER — Encounter: Payer: Self-pay | Admitting: Internal Medicine

## 2017-04-13 ENCOUNTER — Encounter: Payer: Self-pay | Admitting: Family Medicine

## 2017-04-13 MED ORDER — MOMETASONE FUROATE 200 MCG/ACT IN AERO: 2.0000 | INHALATION_SPRAY | Freq: Every day | RESPIRATORY_TRACT | 0 refills | Status: DC

## 2017-04-13 NOTE — Telephone Encounter (Signed)
Can you help?

## 2017-04-13 NOTE — Telephone Encounter (Signed)
Not on current medication list.  Ok to send in?

## 2017-05-18 ENCOUNTER — Encounter: Payer: Self-pay | Admitting: Obstetrics and Gynecology

## 2017-08-15 ENCOUNTER — Encounter: Payer: Self-pay | Admitting: Obstetrics and Gynecology

## 2017-09-07 ENCOUNTER — Encounter: Payer: Self-pay | Admitting: Obstetrics and Gynecology

## 2017-09-14 ENCOUNTER — Ambulatory Visit (INDEPENDENT_AMBULATORY_CARE_PROVIDER_SITE_OTHER): Payer: Medicaid Other | Admitting: Obstetrics and Gynecology

## 2017-09-14 ENCOUNTER — Encounter: Payer: Self-pay | Admitting: Obstetrics and Gynecology

## 2017-09-14 VITALS — BP 108/58 | HR 80 | Ht 65.0 in | Wt 125.0 lb

## 2017-09-14 DIAGNOSIS — Z309 Encounter for contraceptive management, unspecified: Secondary | ICD-10-CM

## 2017-09-14 DIAGNOSIS — Z803 Family history of malignant neoplasm of breast: Secondary | ICD-10-CM

## 2017-09-14 DIAGNOSIS — F3281 Premenstrual dysphoric disorder: Secondary | ICD-10-CM

## 2017-09-14 DIAGNOSIS — Z1329 Encounter for screening for other suspected endocrine disorder: Secondary | ICD-10-CM

## 2017-09-14 DIAGNOSIS — Z01419 Encounter for gynecological examination (general) (routine) without abnormal findings: Secondary | ICD-10-CM

## 2017-09-14 DIAGNOSIS — Z1509 Genetic susceptibility to other malignant neoplasm: Secondary | ICD-10-CM

## 2017-09-14 DIAGNOSIS — Z13220 Encounter for screening for lipoid disorders: Secondary | ICD-10-CM

## 2017-09-14 DIAGNOSIS — Z1321 Encounter for screening for nutritional disorder: Secondary | ICD-10-CM

## 2017-09-14 DIAGNOSIS — Z1239 Encounter for other screening for malignant neoplasm of breast: Secondary | ICD-10-CM

## 2017-09-14 DIAGNOSIS — Z0000 Encounter for general adult medical examination without abnormal findings: Secondary | ICD-10-CM

## 2017-09-14 DIAGNOSIS — Z9189 Other specified personal risk factors, not elsewhere classified: Secondary | ICD-10-CM

## 2017-09-14 DIAGNOSIS — N920 Excessive and frequent menstruation with regular cycle: Secondary | ICD-10-CM

## 2017-09-14 NOTE — Patient Instructions (Signed)
I value your feedback and entrusting us with your care. If you get a [facility] patient survey, I would appreciate you taking the time to let us know about your experience today. Thank you! 

## 2017-09-14 NOTE — Progress Notes (Signed)
Chief Complaint  Patient presents with  . Gynecologic Exam     HPI:      Gloria Rush is a 42 y.o. G0P0000 who LMP was Patient's last menstrual period was 08/26/2017 (exact date)., presents today for her annual examination.  Her menses are regular every 28-30 days, lasting 7 days, heavy for 2 days, although lighter this yr than last yr. Still has clots. She was mildly anemic 11/17 but normal CBC 6/18.  Dysmenorrhea mild, occurring first 1-2 days of flow.  She tried lysteda for menorrhagia in past with good sx relief, but pt has asthma and NSAIDs cause chest tightness for her. PMDD sx controlled this yr with lexapro. Wasn't working last yr and pt given xanax prn sx, but doesn't need it. Doing better this yr. Gets night sweats and fatigue wk before period.  Sex activity: single partner, contraception - none. Hx of infertility.  Last Pap: February 05, 2015  Results were: no abnormalities /neg HPV DNA   Last mammogram: February 05, 2015  Results were: normal--routine follow-up in 12 months. Didn't have mammo last yr due to ins.  There is a FH of breast cancer in her MGM and mat aunt. There is no FH of ovarian cancer. The patient does do self-breast exams. The patient is BRCA neg but Lynch pos. IBIS=20.1%  2014.  Tobacco use: The patient denies current or previous tobacco use. Alcohol use: none Exercise: mod active  She does get adequate calcium and Vitamin D in her diet.  She is Lynch pos (PMS2). Per NCCN guidelines, pt is due for yearly EMB (neg 1/17; pt declined last yr due to insurance, plans to do it 01/2018 when she gets new ins), yearly u/s (done 4/18, EM=7 mm; pt plans to do 1/20) and ca-125 (neg 6/18; wants checked 1/20 with new ins) due to endometrial and ovarian cancer risk since not interested in hyst. She is due for yearly colonoscopy/upper GI. Pt had done with Dr. Hilarie Fredrickson 3/18 and plans to do again in 2020. Her mom's colonoscopy was neg this yr.  She had a neg urine check 4/18  with PCP. Due today.   Pt due to thyroid labs due to hx of stable thyroid nodules. Had low TSH last yr with normal free T4. Also wants full blood panels but will have to wait till 1/20 with new ins (has FP MCD currently which doesn't cover this).    Past Medical History:  Diagnosis Date  . Allergy   . Anxiety   . Asthma   . Family history of breast cancer   . Fibroid   . Genetic testing of female    MyRisk Positive  . Increased risk of breast cancer 2014, 2018   riskscore=22.2% 6/18  . Infertility, female   . Lynch syndrome    PMS2 pos 11/14, ATM VUS  . Panic disorder     Past Surgical History:  Procedure Laterality Date  . WISDOM TOOTH EXTRACTION      Family History  Problem Relation Age of Onset  . Anxiety disorder Mother   . Anxiety disorder Brother   . Diabetes Maternal Grandfather   . Breast cancer Maternal Grandmother 14  . Breast cancer Other 96  . Colon cancer Neg Hx   . Stomach cancer Neg Hx     Social History   Socioeconomic History  . Marital status: Married    Spouse name: Not on file  . Number of children: Not on file  . Years  of education: Not on file  . Highest education level: Not on file  Occupational History  . Not on file  Social Needs  . Financial resource strain: Not on file  . Food insecurity:    Worry: Not on file    Inability: Not on file  . Transportation needs:    Medical: Not on file    Non-medical: Not on file  Tobacco Use  . Smoking status: Never Smoker  . Smokeless tobacco: Never Used  Substance and Sexual Activity  . Alcohol use: No    Alcohol/week: 0.0 standard drinks  . Drug use: No  . Sexual activity: Yes    Partners: Male    Birth control/protection: None  Lifestyle  . Physical activity:    Days per week: Not on file    Minutes per session: Not on file  . Stress: Not on file  Relationships  . Social connections:    Talks on phone: Not on file    Gets together: Not on file    Attends religious service: Not  on file    Active member of club or organization: Not on file    Attends meetings of clubs or organizations: Not on file    Relationship status: Not on file  . Intimate partner violence:    Fear of current or ex partner: Not on file    Emotionally abused: Not on file    Physically abused: Not on file    Forced sexual activity: Not on file  Other Topics Concern  . Not on file  Social History Narrative  . Not on file     Current Outpatient Medications:  .  albuterol (PROVENTIL) (2.5 MG/3ML) 0.083% nebulizer solution, USE ONE VIAL VIA NEBULIZER EVERY 4 HOURS AS NEEDED FOR SHORTNESS OF BREATH, Disp: , Rfl: 0 .  albuterol (VENTOLIN HFA) 108 (90 Base) MCG/ACT inhaler, USE 2 PUFFS EVERY 4 HOURS AS NEEDED FOR WHEEZING, Disp: 1 Inhaler, Rfl: 2 .  ALPRAZolam (XANAX) 0.25 MG tablet, Take 1 tablet (0.25 mg total) by mouth 2 (two) times daily as needed for anxiety., Disp: 30 tablet, Rfl: 0 .  azithromycin (ZITHROMAX Z-PAK) 250 MG tablet, Take 1 tablet (250 mg total) by mouth daily. As directed, Disp: 6 tablet, Rfl: 0 .  Cholecalciferol (VITAMIN D) 2000 units tablet, Take by mouth., Disp: , Rfl:  .  diphenhydrAMINE (BENADRYL) 25 MG tablet, Take 25 mg by mouth every 6 (six) hours as needed., Disp: , Rfl:  .  doxycycline (VIBRA-TABS) 100 MG tablet, Take 1 tablet (100 mg total) by mouth 2 (two) times daily., Disp: 20 tablet, Rfl: 0 .  escitalopram (LEXAPRO) 20 MG tablet, Take 0.5 tablets (10 mg total) by mouth daily., Disp: 30 tablet, Rfl: 5 .  esomeprazole (NEXIUM) 40 MG capsule, Take 1 capsule (40 mg total) by mouth daily. 30 mins before breakfast, Disp: 30 capsule, Rfl: 5 .  fluticasone (FLOVENT HFA) 220 MCG/ACT inhaler, Inhale 2 puffs into the lungs 2 (two) times daily., Disp: 3 Inhaler, Rfl: 3 .  levalbuterol (XOPENEX HFA) 45 MCG/ACT inhaler, Inhale 2 puffs into the lungs every 6 (six) hours as needed for wheezing., Disp: 1 Inhaler, Rfl: 12 .  Mometasone Furoate (ASMANEX HFA) 200 MCG/ACT AERO, Inhale  2 puffs into the lungs daily., Disp: 13 g, Rfl: 0 .  mupirocin ointment (BACTROBAN) 2 %, Apply topically 3 (three) times daily., Disp: 22 g, Rfl: 2 .  nystatin (MYCOSTATIN) 100000 UNIT/ML suspension, Swish and swallow 5 ml four times a  day for 7 days, Disp: 200 mL, Rfl: 2 .  tranexamic acid (LYSTEDA) 650 MG TABS tablet, TAKE 2 TABLETS BY MOUTH 3 TIMES DAILY DURING MENSES FOR A MAXIMUM OF 5 DAYS, Disp: , Rfl: 0  Current Facility-Administered Medications:  .  0.9 %  sodium chloride infusion, 500 mL, Intravenous, Continuous, Pyrtle, Lajuan Lines, MD  ROS:  Review of Systems  Constitutional: Negative for fatigue, fever and unexpected weight change.  Respiratory: Negative for cough, shortness of breath and wheezing.   Cardiovascular: Negative for chest pain, palpitations and leg swelling.  Gastrointestinal: Negative for blood in stool, constipation, diarrhea, nausea and vomiting.  Endocrine: Negative for cold intolerance, heat intolerance and polyuria.  Genitourinary: Negative for dyspareunia, dysuria, flank pain, frequency, genital sores, hematuria, menstrual problem, pelvic pain, urgency, vaginal bleeding, vaginal discharge and vaginal pain.  Musculoskeletal: Negative for back pain, joint swelling and myalgias.  Skin: Negative for rash.  Neurological: Negative for dizziness, syncope, light-headedness, numbness and headaches.  Hematological: Negative for adenopathy.  Psychiatric/Behavioral: Negative for agitation, confusion, sleep disturbance and suicidal ideas. The patient is not nervous/anxious.      Objective: BP (!) 108/58   Pulse 80   Ht _0  (1.651 m)   Wt 125 lb (56.7 kg)   LMP 08/26/2017 (Exact Date)   BMI 20.80 kg/m    Physical Exam  Constitutional: She is oriented to person, place, and time. She appears well-developed and well-nourished.  Genitourinary: Vagina normal and uterus normal. There is no rash or tenderness on the right labia. There is no rash or tenderness on the left  labia. No erythema or tenderness in the vagina. No vaginal discharge found. Right adnexum does not display mass and does not display tenderness. Left adnexum does not display mass and does not display tenderness. Cervix does not exhibit motion tenderness or polyp. Uterus is not enlarged or tender.  Neck: Normal range of motion. No thyromegaly present.  Cardiovascular: Normal rate, regular rhythm and normal heart sounds.  No murmur heard. Pulmonary/Chest: Effort normal and breath sounds normal. Right breast exhibits no mass, no nipple discharge, no skin change and no tenderness. Left breast exhibits no mass, no nipple discharge, no skin change and no tenderness.  Abdominal: Soft. There is no tenderness. There is no guarding.  Musculoskeletal: Normal range of motion.  Neurological: She is alert and oriented to person, place, and time. No cranial nerve deficit.  Psychiatric: She has a normal mood and affect. Her behavior is normal.  Vitals reviewed.   Assessment/Plan: Encounter for annual routine gynecological examination  Screening for breast cancer - Pt to sched mammo.  Family history of breast cancer - Plan: MM 3D SCREEN BREAST BILATERAL  Increased risk of breast cancer - BRCA neg; IBIS=20%. Cont monthly SBE, yearly CBE and mammos, cont VIt D supp. Discussed scr breast MRI, pt to call for ref after mammo if desires - Plan: MM 3D SCREEN BREAST BILATERAL  Lynch syndrome - Pt to RTO 1/20 for GYN u/s, ca-125 and EMB. Declines hyst/BSO as recommended. Plans to do colonscopy 2020. - Plan: CA 125  PMDD (premenstrual dysphoric disorder) - Controlled with lexapro.   Blood tests for routine general physical examination - Plan: Comprehensive metabolic panel, CBC, Lipid panel, CA 125, TSH + free T4, VITAMIN D 25 Hydroxy (Vit-D Deficiency, Fractures)  Screening cholesterol level - Plan: Lipid panel  Encounter for vitamin deficiency screening - Plan: VITAMIN D 25 Hydroxy (Vit-D Deficiency,  Fractures)  Thyroid disorder screening - Plan: TSH + free  T4  Menorrhagia with regular cycle - Plan: CBC             GYN counsel breast self exam, mammography screening, adequate intake of calcium and vitamin D, diet and exercise     F/U  Return in about 4 months (around 01/14/2018) for GYN u/s, EMB, labs.  Mirabelle Cyphers B. Bobbyjoe Pabst, PA-C 09/14/2017 9:41 AM

## 2017-09-30 ENCOUNTER — Encounter: Payer: Self-pay | Admitting: Obstetrics and Gynecology

## 2017-11-07 ENCOUNTER — Ambulatory Visit: Payer: Self-pay | Admitting: Physician Assistant

## 2017-11-07 ENCOUNTER — Encounter: Payer: Self-pay | Admitting: Physician Assistant

## 2017-11-07 VITALS — BP 110/70 | HR 76 | Temp 98.0°F | Wt 125.0 lb

## 2017-11-07 DIAGNOSIS — J40 Bronchitis, not specified as acute or chronic: Secondary | ICD-10-CM

## 2017-11-07 DIAGNOSIS — J45901 Unspecified asthma with (acute) exacerbation: Secondary | ICD-10-CM

## 2017-11-07 MED ORDER — ALBUTEROL SULFATE (2.5 MG/3ML) 0.083% IN NEBU: 2.5000 mg | INHALATION_SOLUTION | Freq: Four times a day (QID) | RESPIRATORY_TRACT | 0 refills | Status: DC | PRN

## 2017-11-07 MED ORDER — AZITHROMYCIN 250 MG PO TABS: ORAL_TABLET | ORAL | 0 refills | Status: DC

## 2017-11-07 MED ORDER — PREDNISONE 10 MG (21) PO TBPK: ORAL_TABLET | ORAL | 0 refills | Status: DC

## 2017-11-07 NOTE — Patient Instructions (Signed)
Thank you for choosing [facility] for your health care needs.  You have been diagnosed with bronchitis; typically a cold/upper respiratory infection that then causes swelling of the bronchial tubes and a nasty cough.  Increase fluids. Rest May use over the counter Delsym for cough.  Take prednisone pack as prescribed. 6 pills on day 1, 5 pills on day 2, 4 pills on day 3, 3 pills on day 4, 2 pills on day 5, and 1 pill on day 6.  Increase use of albuterol inhaler. 2 puffs every 2-4 hours, when awake, for cough, chest tightness, and wheezing.  Continue to use Asmanex inhaler daily.  Do not believe you need an antibiotic. Prescribed Z-pak (printed script). Fill if you develop fever or minimal improvement in cough over the next few days.  Follow-up with family physician or urgent care or at [facility], if symptoms not improving in a few days.  Follow-up at the emergency room if you develop chest pain, shortness of breath, difficulty breathing, or other new/concerning symptom.  Acute Bronchitis, Adult  Acute bronchitis is when air tubes (bronchi) in the lungs suddenly get swollen. The condition can make it hard to breathe. It can also cause these symptoms:  A cough.  Coughing up clear, yellow, or green mucus.  Wheezing.  Chest congestion.  Shortness of breath.  A fever.  Body aches.  Chills.  A sore throat.  Follow these instructions at home: Medicines  Take over-the-counter and prescription medicines only as told by your doctor.  If you were prescribed an antibiotic medicine, take it as told by your doctor. Do not stop taking the antibiotic even if you start to feel better. General instructions  Rest.  Drink enough fluids to keep your pee (urine) clear or pale yellow.  Avoid smoking and secondhand smoke. If you smoke and you need help quitting, ask your doctor. Quitting will help your lungs heal faster.  Use an inhaler, cool mist vaporizer, or humidifier as told by  your doctor.  Keep all follow-up visits as told by your doctor. This is important. How is this prevented? To lower your risk of getting this condition again:  Wash your hands often with soap and water. If you cannot use soap and water, use hand sanitizer.  Avoid contact with people who have cold symptoms.  Try not to touch your hands to your mouth, nose, or eyes.  Make sure to get the flu shot every year.  Contact a doctor if:  Your symptoms do not get better in 2 weeks. Get help right away if:  You cough up blood.  You have chest pain.  You have very bad shortness of breath.  You become dehydrated.  You faint (pass out) or keep feeling like you are going to pass out.  You keep throwing up (vomiting).  You have a very bad headache.  Your fever or chills gets worse. This information is not intended to replace advice given to you by your health care provider. Make sure you discuss any questions you have with your health care provider. Document Released: 06/16/2007 Document Revised: 08/06/2015 Document Reviewed: 06/18/2015 Elsevier Interactive Patient Education  [employer].

## 2017-11-07 NOTE — Addendum Note (Signed)
Addended by: Darlin Priestly on: 11/07/2017 12:07 PM   Modules accepted: Orders

## 2017-11-07 NOTE — Progress Notes (Addendum)
Patient ID: Gloria Rush DOB: [birth year] AGE: 42 y.o. MRN: [id number]   PCP: Owens Loffler, MD   Chief Complaint:  Chief Complaint  Patient presents with  . selfpay-chest tight mucus sinus pressure     Subjective:    HPI:  Gloria Rush is a 42 y.o. female presents for evaluation  Chief Complaint  Patient presents with  . selfpay-chest tight mucus sinus pressure    42 year old female presents to [facility] with 5 day history of URI symptoms. Began with malaise and fatigue. Then developed nasal congestion, bilateral maxillary sinus pressure, PND, and cough. Cough coarse and junky sounding; however, not productive. Has since developed chest tightness and chest heaviness/pressure with wheezing. Began yesterday afternoon/evening. Feels similar to previous episodes of asthma exacerbation. Patient with asthma diagnosis. Currently on Asmanax daily; admits had not been using regularly. Has albuterol inhaler at home and nebulizer machine at home. Patient has taken over the counter Tylenol. Denies fever; however reports sweats/chills. Patient's husband and young son sick with similar symptoms. Son has pediatrician appointment today at [place].  Patient with similar episode 01/08/2016, seen at [facility] ED. Treated for cough due to bronchospasm with DuoNeb treatment and then discharged with prescription for Medrol Dose Pak and Z-pak.   A complete, at least 10 system review of symptoms was performed, pertinent positives and negatives as mentioned in HPI, otherwise negative.  The following portions of the patient's history were reviewed and updated as appropriate: allergies, current medications and past medical history.  Patient Active Problem List   Diagnosis Date Noted  . Menorrhagia with regular cycle 06/11/2016  . Family history of breast cancer 06/11/2016  . Lynch syndrome 04/13/2013  . Unspecified vitamin D deficiency 08/24/2012  . Major depressive disorder, recurrent, in  full remission ([place]) 12/18/2009  . ALLERGIC CONJUNCTIVITIS 12/23/2008  . DERMATITIS, ATOPIC 12/23/2008  . Panic disorder without agoraphobia with panic attacks in partial remission 01/23/2008  . GERD 12/05/2007  . Allergic rhinitis 12/27/2006  . Asthma, moderate persistent, well-controlled 12/27/2006    Allergies  Allergen Reactions  . Amoxicillin     Itching (once), no rash Rapid heart rate  . Chicken Meat (Diagnostic) Other (See Comments)    Throat feels tight and GERD  . Dexilant [Dexlansoprazole] Other (See Comments)    Throat feels tight    Current Outpatient Medications on File Prior to Visit  Medication Sig Dispense Refill  . albuterol (PROVENTIL) (2.5 MG/3ML) 0.083% nebulizer solution USE ONE VIAL VIA NEBULIZER EVERY 4 HOURS AS NEEDED FOR SHORTNESS OF BREATH  0  . albuterol (VENTOLIN HFA) 108 (90 Base) MCG/ACT inhaler USE 2 PUFFS EVERY 4 HOURS AS NEEDED FOR WHEEZING 1 Inhaler 2  . Cholecalciferol (VITAMIN D) 2000 units tablet Take by mouth.    . diphenhydrAMINE (BENADRYL) 25 MG tablet Take 25 mg by mouth every 6 (six) hours as needed.    Marland Kitchen escitalopram (LEXAPRO) 20 MG tablet Take 0.5 tablets (10 mg total) by mouth daily. 30 tablet 5  . Mometasone Furoate (ASMANEX HFA) 200 MCG/ACT AERO Inhale 2 puffs into the lungs daily. 13 g 0   Current Facility-Administered Medications on File Prior to Visit  Medication Dose Route Frequency Provider Last Rate Last Dose  . 0.9 %  sodium chloride infusion  500 mL Intravenous Continuous Pyrtle, Lajuan Lines, MD           Objective:    Vitals:   11/07/17 0926  BP: 110/70  Pulse: 76  Temp: 98 F (36.7  C)  SpO2: 100%     Wt Readings from Last 3 Encounters:  11/07/17 125 lb (56.7 kg)  09/14/17 125 lb (56.7 kg)  07/30/16 121 lb 8 oz (55.1 kg)    Physical Exam:   General Appearance:  Alert, cooperative, no distress, appears stated age. Afebrile.  Head:  Normocephalic, without obvious abnormality, atraumatic  Eyes:  PERRL,  conjunctiva/corneas clear, EOM's intact, fundi benign, both eyes  Ears:  Normal TM's and external ear canals, both ears  Nose: Nares normal, septum midline, mucosa normal, no drainage. Mild bilateral maxillary sinus tenderness with palpation.  Throat: Lips, mucosa, and tongue normal; teeth and gums normal  Neck: Supple, symmetrical, trachea midline, mild bilateral anterior cervical lymphadenopathy;  thyroid: not enlarged, symmetric, no tenderness/mass/nodules; no carotid bruit or JVD  Back:   Symmetric, no curvature, ROM normal, no CVA tenderness  Lungs:   Lungs reveal diffuse expiratory wheezing. Coarse cough elicited with deep inspiration. No rales, rhonchi, or crackles.  Heart:  Regular rate and rhythm, S1 and S2 normal, no murmur, rub, or gallop  Abdomen:   Soft, non-tender, bowel sounds active all four quadrants,  no masses, no organomegaly  Extremities: Extremities normal, atraumatic, no cyanosis or edema  Pulses: 2+ and symmetric  Skin: Skin color, texture, turgor normal, no rashes or lesions  Lymph nodes: Cervical, supraclavicular, and axillary nodes normal  Neurologic: Normal    Assessment & Plan:    Exam findings, diagnosis etiology and medication use and indications reviewed with patient. Follow-Up and discharge instructions provided. No emergent/urgent issues found on exam.  Patient education was provided.   Patient verbalized understanding of information provided and agrees with plan of care (POC), all questions answered. The patient is advised to call or return to clinic if condition does not see an improvement in symptoms, or to seek the care of the closest emergency department if condition worsens with the above plan.    1. Bronchitis  - predniSONE (STERAPRED UNI-PAK 21 TAB) 10 MG (21) TBPK tablet; Take pack as advised on label. Dispense 1 pack. No refills.  Dispense: 1 tablet; Refill: 0 - azithromycin (ZITHROMAX) 250 MG tablet; Take pack as directed on packaging.   Dispense: 6 tablet; Refill: 0 - albuterol (PROVENTIL) (2.5 MG/3ML) 0.083% nebulizer solution; Take 3 mLs (2.5 mg total) by nebulization every 6 (six) hours as needed for up to 14 days for wheezing or shortness of breath.  Dispense: 75 mL; Refill: 0  2. Exacerbation of asthma, unspecified asthma severity, unspecified whether persistent  Patient with 5 day history of URI symptoms. New onset asthma exacerbation. Offered albuterol nebulizer treatment in office, patient declined due to cost of $49. Prescribed prednisone taper and refills of albuterol ampules for nebulizer machine. Given printed script for z-pak, as wait & hold antibiotic. Patient given work note for 1 week off from work, at her request.  Addendum 11/11/2017: Patient called. Requested albuterol nebulizer solution be sent to [facility] in [place]. Sent albuterol nebulizer solution to new pharmacy. SFS PA-C. 9:55am.    Montey Hora, MHS, PA-C Advanced Practice Provider [facility]  [address], [facility], [place], [place] [address] (p):  [phone] [email or web address]

## 2017-11-09 ENCOUNTER — Telehealth: Payer: Self-pay | Admitting: Emergency Medicine

## 2017-11-09 NOTE — Telephone Encounter (Signed)
Left message follow up call from [facility] visit 

## 2017-11-11 ENCOUNTER — Telehealth: Payer: Self-pay | Admitting: Emergency Medicine

## 2017-11-11 ENCOUNTER — Encounter: Payer: Self-pay | Admitting: Family Medicine

## 2017-11-11 MED ORDER — ALBUTEROL SULFATE (2.5 MG/3ML) 0.083% IN NEBU: 2.5000 mg | INHALATION_SOLUTION | Freq: Four times a day (QID) | RESPIRATORY_TRACT | 0 refills | Status: DC | PRN

## 2017-11-11 NOTE — Telephone Encounter (Signed)
Patient contacted office this morning stated that she is doing better but feels achy in chest still taking prednisone and hasnt done a neb treatment. she was wondering should she pick up the zpak.  I spoke with provider whom instructed me to inform patient that she needs to do a neb treatment and continue steroid and not to pick up zpak at this time since she is doing better. Patient acknowledge understanding. Requested to have neb solution sent to another pharmacy because of cost.

## 2017-11-11 NOTE — Addendum Note (Signed)
Addended by: Darlin Priestly on: 11/11/2017 09:56 AM   Modules accepted: Orders

## 2017-11-21 ENCOUNTER — Encounter: Payer: Self-pay | Admitting: Family Medicine

## 2017-11-21 ENCOUNTER — Ambulatory Visit: Payer: Self-pay | Admitting: Family Medicine

## 2017-11-21 DIAGNOSIS — R05 Cough: Secondary | ICD-10-CM

## 2017-11-21 DIAGNOSIS — R059 Cough, unspecified: Secondary | ICD-10-CM

## 2017-11-21 MED ORDER — PREDNISONE 10 MG PO TABS: ORAL_TABLET | ORAL | 0 refills | Status: DC

## 2017-11-21 MED ORDER — AZITHROMYCIN 250 MG PO TABS: ORAL_TABLET | ORAL | 0 refills | Status: DC

## 2017-11-21 NOTE — Patient Instructions (Addendum)
I would get a flu shot when you feel better.   Start prednisone, out of work for now, use the inhaler as needed.  If not better in a few days, then start zithromax.  Take care.  Glad to see you.  Update [place] as needed.

## 2017-11-21 NOTE — Progress Notes (Signed)
Sx started about 2 weeks ago.  Seen at [facility].  She took a course of prednisone, over 6 days.  She hasn't been on abx.  She got some better on prednisone but she is back to prev sick baseline with cough/chest tightness in the last few days.  Minimal sputum.  No fevers known.  No vomiting.  Some mild maxillary pressure but the chest tightness was more bothersome.  No recent wheeze.  Using SABA helps a little.   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    Meds, vitals, and allergies reviewed.   ROS: Per HPI unless specifically indicated in ROS section   GEN: nad, alert and oriented, she is speaking in complete sentences.   HEENT: mucous membranes moist, tm w/o erythema, nasal exam w/o erythema, clear discharge noted,  OP with cobblestoning, sinuses not ttp.   NECK: supple w/o LA CV: rrr.   PULM: ctab, no inc wob EXT: no edema SKIN: no acute rash

## 2017-11-22 DIAGNOSIS — R059 Cough, unspecified: Secondary | ICD-10-CM | POA: Insufficient documentation

## 2017-11-22 DIAGNOSIS — R05 Cough: Secondary | ICD-10-CM | POA: Insufficient documentation

## 2017-11-22 NOTE — Assessment & Plan Note (Signed)
Lungs are clear.  Okay for outpatient follow-up.  Reasonable to get a flu shot when she feels better.  She could have had relapse of previous symptoms or she could have a new viral issue.  Discussed.  Work note done. Start prednisone, out of work for now, use SABA as needed.  If not better in a few days (and given the duration of her recent issues) then start zithromax.  Update me as needed.  She agrees.

## 2017-11-24 ENCOUNTER — Encounter: Payer: Self-pay | Admitting: Family Medicine

## 2017-11-25 ENCOUNTER — Encounter: Payer: Self-pay | Admitting: Family Medicine

## 2017-12-05 ENCOUNTER — Encounter: Payer: Self-pay | Admitting: Family Medicine

## 2017-12-22 ENCOUNTER — Encounter: Payer: Self-pay | Admitting: Obstetrics and Gynecology

## 2017-12-26 ENCOUNTER — Encounter: Payer: Self-pay | Admitting: Obstetrics and Gynecology

## 2017-12-29 NOTE — Telephone Encounter (Signed)
Pt has f/u u/s question. I answered yeast.

## 2018-01-09 ENCOUNTER — Encounter: Payer: Self-pay | Admitting: Family Medicine

## 2018-01-10 MED ORDER — NYSTATIN 100000 UNIT/ML MT SUSP: 5.0000 mL | Freq: Four times a day (QID) | OROMUCOSAL | 1 refills | Status: DC

## 2018-01-17 ENCOUNTER — Encounter: Payer: Self-pay | Admitting: Obstetrics and Gynecology

## 2018-01-27 ENCOUNTER — Encounter: Payer: Self-pay | Admitting: Obstetrics and Gynecology

## 2018-02-01 ENCOUNTER — Telehealth: Payer: Self-pay

## 2018-02-01 NOTE — Telephone Encounter (Signed)
Pt aware. She asked if a premenopausal person would bleed 2 times in a month, then go back to one cycle next month, I told her I didn't know the answer to that question, offered to send [facility] another msg and she said no need.

## 2018-02-01 NOTE — Telephone Encounter (Signed)
Pts insurance is kicking in on Feb 21st. She says she probably has fibroids or menopausal (mom was done with her menopause at 23). This month it appears like she has had 2 cycles, last month was just spotting. Wants u/s done but wants to know if you advise waiting for insurance to kick in or to go ahead and schedule an appointment ASAP. She is not having any other concerns/symptoms with these abnormal cycles. Please advise.

## 2018-02-01 NOTE — Telephone Encounter (Signed)
She really needs EMB too with u/s due to hx of Lynch syndrome. She can wait till insurance if she prefers but needs to f/u sooner if bleeding heavy or persisting.

## 2018-03-08 ENCOUNTER — Encounter: Payer: Self-pay | Admitting: Obstetrics and Gynecology

## 2018-03-09 NOTE — Telephone Encounter (Signed)
03/09/18 Florin

## 2018-03-10 ENCOUNTER — Encounter: Payer: Self-pay | Admitting: Family Medicine

## 2018-03-10 ENCOUNTER — Other Ambulatory Visit: Payer: Self-pay | Admitting: Family Medicine

## 2018-03-10 MED ORDER — ESCITALOPRAM OXALATE 20 MG PO TABS: 10.0000 mg | ORAL_TABLET | Freq: Every day | ORAL | 0 refills | Status: DC

## 2018-03-13 MED ORDER — MUPIROCIN 2 % EX OINT: TOPICAL_OINTMENT | Freq: Three times a day (TID) | CUTANEOUS | 2 refills | Status: AC

## 2018-03-13 NOTE — Addendum Note (Signed)
Addended by: Owens Loffler on: 03/13/2018 09:18 AM   Modules accepted: Orders

## 2018-03-15 ENCOUNTER — Encounter: Payer: Self-pay | Admitting: Family Medicine

## 2018-03-25 ENCOUNTER — Encounter: Payer: Self-pay | Admitting: Family Medicine

## 2018-04-05 ENCOUNTER — Encounter: Payer: Self-pay | Admitting: Family Medicine

## 2018-04-05 MED ORDER — ESCITALOPRAM OXALATE 20 MG PO TABS: 10.0000 mg | ORAL_TABLET | Freq: Every day | ORAL | 0 refills | Status: DC

## 2018-05-15 ENCOUNTER — Encounter: Payer: Self-pay | Admitting: Family Medicine

## 2018-05-30 ENCOUNTER — Encounter: Payer: Self-pay | Admitting: Obstetrics and Gynecology

## 2018-05-30 NOTE — Telephone Encounter (Signed)
Pls sched GYN for DUB and appt with me afterwards (first wk of June if possible).

## 2018-05-31 NOTE — Telephone Encounter (Signed)
Pt needs to sched GYN u/s for DUB/leio. I will call with results.

## 2018-06-01 ENCOUNTER — Other Ambulatory Visit: Payer: Self-pay | Admitting: Obstetrics and Gynecology

## 2018-06-01 DIAGNOSIS — N939 Abnormal uterine and vaginal bleeding, unspecified: Secondary | ICD-10-CM

## 2018-06-01 NOTE — Telephone Encounter (Signed)
Patient is schedule Friday, 06/02/18.

## 2018-06-02 ENCOUNTER — Other Ambulatory Visit: Payer: Medicaid Other

## 2018-06-06 ENCOUNTER — Ambulatory Visit: Payer: BLUE CROSS/BLUE SHIELD

## 2018-06-06 ENCOUNTER — Encounter: Payer: Self-pay | Admitting: Obstetrics and Gynecology

## 2018-06-06 ENCOUNTER — Other Ambulatory Visit: Payer: Self-pay

## 2018-06-06 DIAGNOSIS — Z1329 Encounter for screening for other suspected endocrine disorder: Secondary | ICD-10-CM

## 2018-06-06 DIAGNOSIS — D250 Submucous leiomyoma of uterus: Secondary | ICD-10-CM

## 2018-06-06 DIAGNOSIS — Z1509 Genetic susceptibility to other malignant neoplasm: Secondary | ICD-10-CM

## 2018-06-06 DIAGNOSIS — N83202 Unspecified ovarian cyst, left side: Secondary | ICD-10-CM

## 2018-06-06 DIAGNOSIS — Z0000 Encounter for general adult medical examination without abnormal findings: Secondary | ICD-10-CM

## 2018-06-06 DIAGNOSIS — Z13220 Encounter for screening for lipoid disorders: Secondary | ICD-10-CM

## 2018-06-06 DIAGNOSIS — N939 Abnormal uterine and vaginal bleeding, unspecified: Secondary | ICD-10-CM | POA: Diagnosis not present

## 2018-06-06 NOTE — Telephone Encounter (Addendum)
Pt aware of ovarian cyst and risk for torsion. Go to ED if has significant LLQ pain for medical emergency. Pt denies pain currently but did have bad LLQ pain with ovulation 2/20 cycle. Rechk u/s in 4 wks/sooner prn and check ca-125 with upcoming labs. Pt aware to notify us if has any change in pain sx.  Pt with issues of recent AUB. Hx of Lynch syndrome. Had neg EMB 8/18, none since then. Pt with hx of leio as well as thyroid nodules with subclinical hyperthyroidism. Last lab check 2018. Pt interested in thyroid labs and EMB. U/S shows SM leio which could be cause of AUB if all other eval neg. Pt understands EMB is only way to rule out any endometrial pathology.   Pt also wants fasting labs. Will call office to sched lab appt.    ULTRASOUND REPORT  Location: [facility]  Date of Service: 06/06/2018    Indications:AUB Findings:  The uterus is anteverted and measures 8.5 x 5.4 x 5.0cm. Echo texture is homogenous with evidence of focal mass. Within the uterus is one suspected fibroid measuring: Fibroid 1:  4.0 x 3.4 x 3.6cm (midline/low, SM)  The Endometrium measures 3.8 mm (probable inaccurate measurement due to part of endometrium being obscured by fibroid). Arcuate appearance of uterus.   Right Ovary measures 3.1 x 2.7 x 1.4 cm. It is normal in appearance. Left Ovary measures 7.9 x 7.4 x 6.0 cm with simple cyst measuring 7.1 x 5.5 x 7.1cm.  Survey of the adnexa demonstrates no adnexal masses. There is no free fluid in the cul de sac.  Impression: 1. Single submucosal fibroid.  2. Simple left ovarian cyst 7.1cm.  Recommendations: 1.Clinical correlation with the patient's History and Physical Exam.   Vita Barley, RT

## 2018-06-06 NOTE — Telephone Encounter (Deleted)
ADDENDUM: Pt aware of ovarian cyst and risk for torsion. Go to ED if has significant LLQ pain for medical emergency. Pt denies pain currently but did have bad LLQ pain with ovulation 2/20 cycle. Rechk u/s in 4 wks/sooner prn and check ca-125 with upcoming labs. Pt aware to notify us if has any change in pain sx.  Pt with issues of recent AUB. Hx of Lynch syndrome. Had neg EMB 8/18, none since then. Pt with hx of leio as well as thyroid nodules with subclinical hyperthyroidism. Last lab check 2018. Pt interested in thyroid labs and EMB. U/S shows SM leio which could be cause of AUB if all other eval neg. Pt understands EMB is only way to rule out any endometrial pathology.   Pt also wants fasting labs. Will call office to sched lab appt.

## 2018-06-08 ENCOUNTER — Other Ambulatory Visit: Payer: BLUE CROSS/BLUE SHIELD

## 2018-06-08 ENCOUNTER — Other Ambulatory Visit: Payer: Self-pay

## 2018-06-08 DIAGNOSIS — Z1509 Genetic susceptibility to other malignant neoplasm: Secondary | ICD-10-CM | POA: Diagnosis not present

## 2018-06-08 DIAGNOSIS — Z13220 Encounter for screening for lipoid disorders: Secondary | ICD-10-CM | POA: Diagnosis not present

## 2018-06-08 DIAGNOSIS — Z0000 Encounter for general adult medical examination without abnormal findings: Secondary | ICD-10-CM | POA: Diagnosis not present

## 2018-06-08 DIAGNOSIS — N920 Excessive and frequent menstruation with regular cycle: Secondary | ICD-10-CM | POA: Diagnosis not present

## 2018-06-09 LAB — COMPREHENSIVE METABOLIC PANEL
A/G RATIO: 1.5 (ref 1.2–2.2)
ALK PHOS: 79 IU/L (ref 39–117)
ALT: 12 IU/L (ref 0–32)
AST: 18 IU/L (ref 0–40)
Albumin: 4 g/dL (ref 3.8–4.8)
BILIRUBIN TOTAL: 0.4 mg/dL (ref 0.0–1.2)
BUN/Creatinine Ratio: 8 — ABNORMAL LOW (ref 9–23)
BUN: 6 mg/dL (ref 6–24)
CHLORIDE: 103 mmol/L (ref 96–106)
CO2: 22 mmol/L (ref 20–29)
Calcium: 8.8 mg/dL (ref 8.7–10.2)
Creatinine, Ser: 0.8 mg/dL (ref 0.57–1.00)
GFR calc non Af Amer: 92 mL/min/{1.73_m2} (ref >59)
GFR, EST AFRICAN AMERICAN: 106 mL/min/{1.73_m2} (ref >59)
GLUCOSE: 85 mg/dL (ref 65–99)
Globulin, Total: 2.7 g/dL (ref 1.5–4.5)
POTASSIUM: 4.2 mmol/L (ref 3.5–5.2)
Sodium: 140 mmol/L (ref 134–144)
TOTAL PROTEIN: 6.7 g/dL (ref 6.0–8.5)

## 2018-06-09 LAB — CBC
Hematocrit: 32.3 % — ABNORMAL LOW (ref 34.0–46.6)
Hemoglobin: 9.2 g/dL — ABNORMAL LOW (ref 11.1–15.9)
MCH: 23.1 pg — ABNORMAL LOW (ref 26.6–33.0)
MCHC: 28.5 g/dL — AB (ref 31.5–35.7)
MCV: 81 fL (ref 79–97)
Platelets: 189 10*3/uL (ref 150–450)
RBC: 3.99 x10E6/uL (ref 3.77–5.28)
RDW: 14.7 % (ref 11.7–15.4)
WBC: 5.3 10*3/uL (ref 3.4–10.8)

## 2018-06-09 LAB — CA 125: Cancer Antigen (CA) 125: 12.2 U/mL (ref 0.0–38.1)

## 2018-06-09 LAB — TSH+FREE T4
FREE T4: 0.65 ng/dL — AB (ref 0.82–1.77)
TSH: 0.635 u[IU]/mL (ref 0.450–4.500)

## 2018-06-09 LAB — LIPID PANEL
CHOLESTEROL TOTAL: 163 mg/dL (ref 100–199)
Chol/HDL Ratio: 2.3 ratio (ref 0.0–4.4)
HDL: 71 mg/dL (ref >39)
LDL CALC: 81 mg/dL (ref 0–99)
TRIGLYCERIDES: 57 mg/dL (ref 0–149)
VLDL Cholesterol Cal: 11 mg/dL (ref 5–40)

## 2018-06-09 LAB — VITAMIN D 25 HYDROXY (VIT D DEFICIENCY, FRACTURES): VIT D 25 HYDROXY: 33.1 ng/mL (ref 30.0–100.0)

## 2018-06-13 ENCOUNTER — Encounter: Payer: Self-pay | Admitting: Obstetrics and Gynecology

## 2018-06-21 ENCOUNTER — Ambulatory Visit: Payer: Medicaid Other | Admitting: Family Medicine

## 2018-07-11 ENCOUNTER — Encounter: Payer: Self-pay | Admitting: Family Medicine

## 2018-07-11 MED ORDER — ESCITALOPRAM OXALATE 20 MG PO TABS: 10.0000 mg | ORAL_TABLET | Freq: Every day | ORAL | 0 refills | Status: DC

## 2018-08-04 ENCOUNTER — Encounter: Payer: Self-pay | Admitting: Obstetrics and Gynecology

## 2018-08-07 ENCOUNTER — Other Ambulatory Visit: Payer: Self-pay

## 2018-08-07 ENCOUNTER — Encounter: Payer: Self-pay | Admitting: Family Medicine

## 2018-08-07 ENCOUNTER — Ambulatory Visit (INDEPENDENT_AMBULATORY_CARE_PROVIDER_SITE_OTHER): Payer: BC Managed Care – PPO | Admitting: Family Medicine

## 2018-08-07 VITALS — BP 102/60 | HR 83 | Temp 98.7°F | Ht 63.5 in | Wt 132.5 lb

## 2018-08-07 DIAGNOSIS — Z0000 Encounter for general adult medical examination without abnormal findings: Secondary | ICD-10-CM | POA: Diagnosis not present

## 2018-08-07 DIAGNOSIS — D649 Anemia, unspecified: Secondary | ICD-10-CM | POA: Diagnosis not present

## 2018-08-07 DIAGNOSIS — R7989 Other specified abnormal findings of blood chemistry: Secondary | ICD-10-CM

## 2018-08-07 DIAGNOSIS — D509 Iron deficiency anemia, unspecified: Secondary | ICD-10-CM

## 2018-08-07 DIAGNOSIS — R946 Abnormal results of thyroid function studies: Secondary | ICD-10-CM

## 2018-08-07 NOTE — Progress Notes (Signed)
Gloria Rush T. Gloria Heath, MD Primary Care and [place] at [facility] [place], [address] Phone: [phone]  FAX: [place] - 43 y.o. female  MRN [id number]  Date of Birth: [birth year]  Visit Date: 08/07/2018  PCP: Gloria Loffler, MD  Referred by: Gloria Loffler, MD  Chief Complaint  Patient presents with  . Annual Exam   Patient Care Team: Gloria Loffler, MD as PCP - General Subjective:   Gloria Rush is a 43 y.o. pleasant patient who presents with the following:  Health Maintenance Summary Reviewed and updated, unless pt declines services.  Tobacco History Reviewed. Non-smoker Alcohol: No concerns, no excessive use Exercise Habits: Some activity, rec at least 30 mins 5 times a week STD concerns: none Drug Use: None Birth control method:  none Menses regular: yes - heavy Breast Cancer Family History: Lynch  Stopped all soda  hgb 9 on last check with GYN Check thyroid - abnormal T4 as well  Health Maintenance  Topic Date Due  . TETANUS/TDAP  03/31/1996  . COLONOSCOPY  03/30/2017  . PAP SMEAR-Modifier  02/04/2018  . INFLUENZA VACCINE  08/12/2018  . HIV Screening  Completed    Immunization History  Administered Date(s) Administered  . Influenza Split 12/24/2010  . Influenza Whole 12/27/2006, 10/10/2007, 11/19/2009  . Influenza, Quadrivalent, Recombinant, Inj, Pf 11/19/2015, 11/26/2016  . Influenza,trivalent, recombinat, inj, PF 12/21/2013  . Influenza-Unspecified 11/25/2012, 01/29/2015  . PPD Test 07/14/2010   Patient Active Problem List   Diagnosis Date Noted  . Lynch syndrome 04/13/2013    Priority: High  . Major depressive disorder, recurrent, in full remission ([place]) 12/18/2009    Priority: Medium  . Panic disorder without agoraphobia with panic attacks in partial remission 01/23/2008    Priority: Medium  . Asthma, moderate persistent, well-controlled 12/27/2006   Priority: Medium  . Cough 11/22/2017  . Menorrhagia with regular cycle 06/11/2016  . Family history of breast cancer 06/11/2016  . Unspecified vitamin D deficiency 08/24/2012  . ALLERGIC CONJUNCTIVITIS 12/23/2008  . DERMATITIS, ATOPIC 12/23/2008  . GERD 12/05/2007  . Allergic rhinitis 12/27/2006   Past Medical History:  Diagnosis Date  . Allergy   . Anxiety   . Asthma   . Family history of breast cancer   . Fibroid   . Genetic testing of female    MyRisk Positive  . Increased risk of breast cancer 2014, 2018   riskscore=22.2% 6/18  . Infertility, female   . Lynch syndrome    PMS2 pos 11/14, ATM VUS  . Panic disorder    Past Surgical History:  Procedure Laterality Date  . WISDOM TOOTH EXTRACTION     Social History   Socioeconomic History  . Marital status: Married    Spouse name: Not on file  . Number of children: Not on file  . Years of education: Not on file  . Highest education level: Not on file  Occupational History  . Not on file  Social Needs  . Financial resource strain: Not on file  . Food insecurity    Worry: Not on file    Inability: Not on file  . Transportation needs    Medical: Not on file    Non-medical: Not on file  Tobacco Use  . Smoking status: Never Smoker  . Smokeless tobacco: Never Used  Substance and Sexual Activity  . Alcohol use: No    Alcohol/week: 0.0 standard drinks  . Drug use: No  .  Sexual activity: Yes    Partners: Male    Birth control/protection: None  Lifestyle  . Physical activity    Days per week: Not on file    Minutes per session: Not on file  . Stress: Not on file  Relationships  . Social Herbalist on phone: Not on file    Gets together: Not on file    Attends religious service: Not on file    Active member of club or organization: Not on file    Attends meetings of clubs or organizations: Not on file    Relationship status: Not on file  . Intimate partner violence    Fear of current or ex partner:  Not on file    Emotionally abused: Not on file    Physically abused: Not on file    Forced sexual activity: Not on file  Other Topics Concern  . Not on file  Social History Narrative  . Not on file   Family History  Problem Relation Age of Onset  . Anxiety disorder Mother   . Anxiety disorder Brother   . Diabetes Maternal Grandfather   . Breast cancer Maternal Grandmother 91  . Breast cancer Other 48  . Colon cancer Neg Hx   . Stomach cancer Neg Hx    Allergies  Allergen Reactions  . Amoxicillin     Itching (once), no rash Rapid heart rate  . Chicken Meat (Diagnostic) Other (See Comments)    Throat feels tight and GERD  . Dexilant [Dexlansoprazole] Other (See Comments)    Throat feels tight    Medication list has been reviewed and updated.   General: Denies fever, chills, sweats. No significant weight loss. Eyes: Denies blurring,significant itching ENT: Denies earache, sore throat, and hoarseness.  Cardiovascular: Denies chest pains, palpitations, dyspnea on exertion,  Respiratory: Denies cough, dyspnea at rest,wheeezing Breast: no concerns about lumps GI: Denies nausea, vomiting, diarrhea, constipation, change in bowel habits, abdominal pain, melena, hematochezia GU: Denies dysuria, hematuria, urinary hesitancy, nocturia, denies STD risk, no concerns about discharge Musculoskeletal: Denies back pain, joint pain Derm: Denies rash, itching Neuro: Denies  paresthesias, frequent falls, frequent headaches Psych: Denies depression, anxiety Endocrine: Denies cold intolerance, heat intolerance, polydipsia Heme: Denies enlarged lymph nodes Allergy: No hayfever  Objective:   BP 102/60   Pulse 83   Temp 98.7 F (37.1 C) (Oral)   Ht 5' 3.5" (1.613 m)   Wt 132 lb 8 oz (60.1 kg)   LMP 08/06/2018   SpO2 98%   BMI 23.10 kg/m  Ideal Body Weight: Weight in (lb) to have BMI = 25: 143.1 No exam data present Depression screen [facility] 2/9 08/07/2018  Decreased Interest 0  Down,  Depressed, Hopeless 0  PHQ - 2 Score 0     GEN: well developed, well nourished, no acute distress Eyes: conjunctiva and lids normal, PERRLA, EOMI ENT: TM clear, nares clear, oral exam WNL Neck: supple, no lymphadenopathy, no thyromegaly, no JVD Pulm: clear to auscultation and percussion, respiratory effort normal CV: regular rate and rhythm, S1-S2, no murmur, rub or gallop, no bruits Chest: no scars, masses, no lumps BREAST: breast exam declined GI: soft, non-tender; no hepatosplenomegaly, masses; active bowel sounds all quadrants GU: GU exam declined Lymph: no cervical, axillary or inguinal adenopathy MSK: gait normal, muscle tone and strength WNL, no joint swelling, effusions, discoloration, crepitus  SKIN: clear, good turgor, color WNL, no rashes, lesions, or ulcerations Neuro: normal mental status, normal strength, sensation, and motion  Psych: alert; oriented to person, place and time, normally interactive and not anxious or depressed in appearance.   All labs reviewed with patient. Results for orders placed or performed in visit on 09/14/17  Comprehensive metabolic panel  Result Value Ref Range   Glucose 85 65 - 99 mg/dL   BUN 6 6 - 24 mg/dL   Creatinine, Ser 0.80 0.57 - 1.00 mg/dL   GFR calc non Af Amer 92 >59 mL/min/1.73   GFR calc Af Amer 106 >59 mL/min/1.73   BUN/Creatinine Ratio 8 (L) 9 - 23   Sodium 140 134 - 144 mmol/L   Potassium 4.2 3.5 - 5.2 mmol/L   Chloride 103 96 - 106 mmol/L   CO2 22 20 - 29 mmol/L   Calcium 8.8 8.7 - 10.2 mg/dL   Total Protein 6.7 6.0 - 8.5 g/dL   Albumin 4.0 3.8 - 4.8 g/dL   Globulin, Total 2.7 1.5 - 4.5 g/dL   Albumin/Globulin Ratio 1.5 1.2 - 2.2   Bilirubin Total 0.4 0.0 - 1.2 mg/dL   Alkaline Phosphatase 79 39 - 117 IU/L   AST 18 0 - 40 IU/L   ALT 12 0 - 32 IU/L  CBC  Result Value Ref Range   WBC 5.3 3.4 - 10.8 x10E3/uL   RBC 3.99 3.77 - 5.28 x10E6/uL   Hemoglobin 9.2 (L) 11.1 - 15.9 g/dL   Hematocrit 32.3 (L) 34.0 - 46.6 %    MCV 81 79 - 97 fL   MCH 23.1 (L) 26.6 - 33.0 pg   MCHC 28.5 (L) 31.5 - 35.7 g/dL   RDW 14.7 11.7 - 15.4 %   Platelets 189 150 - 450 x10E3/uL  Lipid panel  Result Value Ref Range   Cholesterol, Total 163 100 - 199 mg/dL   Triglycerides 57 0 - 149 mg/dL   HDL 71 >39 mg/dL   VLDL Cholesterol Cal 11 5 - 40 mg/dL   LDL Calculated 81 0 - 99 mg/dL   Chol/HDL Ratio 2.3 0.0 - 4.4 ratio  CA 125  Result Value Ref Range   Cancer Antigen (CA) 125 12.2 0.0 - 38.1 U/mL  TSH + free T4  Result Value Ref Range   TSH 0.635 0.450 - 4.500 uIU/mL   Free T4 0.65 (L) 0.82 - 1.77 ng/dL  VITAMIN D 25 Hydroxy (Vit-D Deficiency, Fractures)  Result Value Ref Range   Vit D, 25-Hydroxy 33.1 30.0 - 100.0 ng/mL   No results found.  Assessment and Plan:     ICD-10-CM   1. Healthcare maintenance  Z00.00   2. Abnormal TSH  R79.89 TSH    T3, free    T4, free  3. Iron deficiency anemia, unspecified iron deficiency anemia type  D50.9   4. Anemia, unspecified type  D64.9 CBC with Differential/Platelet    IBC + Ferritin    Vitamin B12    Folate   Work-up as above. I suspect not on enough iron.  Health Maintenance Exam: The patient's preventative maintenance and recommended screening tests for an annual wellness exam were reviewed in full today. Brought up to date unless services declined.  Counselled on the importance of diet, exercise, and its role in overall health and mortality. The patient's FH and SH was reviewed, including their home life, tobacco status, and drug and alcohol status.  Follow-up in 1 year for physical exam or additional follow-up below.  Follow-up: No follow-ups on file. Or follow-up in 1 year if not noted.  Future Appointments  Date Time  Provider [place]  09/08/2018 11:40 AM [facility]    No orders of the defined types were placed in this encounter.  There are no discontinued medications. Orders Placed This Encounter  Procedures  . TSH  . T3, free  .  T4, free  . CBC with Differential/Platelet  . IBC + Ferritin  . Vitamin B12  . Folate    Signed,  Earlean Fidalgo T. Laura Caldas, MD   Allergies as of 08/07/2018      Reactions   Amoxicillin    Itching (once), no rash Rapid heart rate   Chicken Meat (diagnostic) Other (See Comments)   Throat feels tight and GERD   Dexilant [dexlansoprazole] Other (See Comments)   Throat feels tight      Medication List       Accurate as of August 07, 2018 11:59 PM. If you have any questions, ask your nurse or doctor.        albuterol 108 (90 Base) MCG/ACT inhaler Commonly known as: Ventolin HFA USE 2 PUFFS EVERY 4 HOURS AS NEEDED FOR WHEEZING   albuterol (2.5 MG/3ML) 0.083% nebulizer solution Commonly known as: PROVENTIL Take 3 mLs (2.5 mg total) by nebulization every 6 (six) hours as needed for wheezing or shortness of breath.   diphenhydrAMINE 25 MG tablet Commonly known as: BENADRYL Take 25 mg by mouth every 6 (six) hours as needed.   escitalopram 20 MG tablet Commonly known as: LEXAPRO Take 0.5 tablets (10 mg total) by mouth daily.

## 2018-08-08 LAB — CBC WITH DIFFERENTIAL/PLATELET
Basophils Absolute: 0 10*3/uL (ref 0.0–0.1)
Basophils Relative: 0.7 % (ref 0.0–3.0)
Eosinophils Absolute: 0.4 10*3/uL (ref 0.0–0.7)
Eosinophils Relative: 6.2 % — ABNORMAL HIGH (ref 0.0–5.0)
HCT: 33.5 % — ABNORMAL LOW (ref 36.0–46.0)
Hemoglobin: 10.1 g/dL — ABNORMAL LOW (ref 12.0–15.0)
Lymphocytes Relative: 15.6 % (ref 12.0–46.0)
Lymphs Abs: 1.1 10*3/uL (ref 0.7–4.0)
MCHC: 30.2 g/dL (ref 30.0–36.0)
MCV: 79.1 fl (ref 78.0–100.0)
Monocytes Absolute: 0.4 10*3/uL (ref 0.1–1.0)
Monocytes Relative: 6.3 % (ref 3.0–12.0)
Neutro Abs: 4.8 10*3/uL (ref 1.4–7.7)
Neutrophils Relative %: 71.2 % (ref 43.0–77.0)
Platelets: 194 10*3/uL (ref 150.0–400.0)
RBC: 4.23 Mil/uL (ref 3.87–5.11)
RDW: 19.1 % — ABNORMAL HIGH (ref 11.5–15.5)
WBC: 6.7 10*3/uL (ref 4.0–10.5)

## 2018-08-08 LAB — FOLATE: Folate: >23.9 ng/mL (ref >5.9)

## 2018-08-08 LAB — IBC + FERRITIN
Ferritin: 4.2 ng/mL — ABNORMAL LOW (ref 10.0–291.0)
Iron: 40 ug/dL — ABNORMAL LOW (ref 42–145)
Saturation Ratios: 9.1 % — ABNORMAL LOW (ref 20.0–50.0)
Transferrin: 314 mg/dL (ref 212.0–360.0)

## 2018-08-08 LAB — T4, FREE: Free T4: 0.57 ng/dL — ABNORMAL LOW (ref 0.60–1.60)

## 2018-08-08 LAB — VITAMIN B12: Vitamin B-12: 540 pg/mL (ref 211–911)

## 2018-08-08 LAB — TSH: TSH: 0.29 u[IU]/mL — ABNORMAL LOW (ref 0.35–4.50)

## 2018-08-08 LAB — T3, FREE: T3, Free: 3.7 pg/mL (ref 2.3–4.2)

## 2018-08-09 ENCOUNTER — Encounter: Payer: Self-pay | Admitting: Obstetrics and Gynecology

## 2018-08-09 ENCOUNTER — Encounter: Payer: Self-pay | Admitting: Family Medicine

## 2018-08-09 DIAGNOSIS — R7989 Other specified abnormal findings of blood chemistry: Secondary | ICD-10-CM

## 2018-08-09 DIAGNOSIS — R799 Abnormal finding of blood chemistry, unspecified: Secondary | ICD-10-CM

## 2018-08-10 ENCOUNTER — Encounter: Payer: Self-pay | Admitting: Family Medicine

## 2018-08-11 ENCOUNTER — Encounter: Payer: Self-pay | Admitting: Obstetrics and Gynecology

## 2018-08-11 ENCOUNTER — Encounter: Payer: Self-pay | Admitting: Family Medicine

## 2018-08-14 ENCOUNTER — Encounter: Payer: Self-pay | Admitting: Family Medicine

## 2018-08-16 ENCOUNTER — Encounter: Payer: Self-pay | Admitting: Family Medicine

## 2018-08-30 NOTE — Telephone Encounter (Signed)
Appt is Wednesday, 10-11-18@1pm  with Dr. Honor Junes

## 2018-09-08 ENCOUNTER — Ambulatory Visit
Admission: RE | Admit: 2018-09-08 | Discharge: 2018-09-08 | Disposition: A | Discharge status: Home / Self Care | Payer: BC Managed Care – PPO | Source: Ambulatory Visit | Attending: Obstetrics and Gynecology | Admitting: Obstetrics and Gynecology

## 2018-09-08 DIAGNOSIS — Z9189 Other specified personal risk factors, not elsewhere classified: Secondary | ICD-10-CM | POA: Insufficient documentation

## 2018-09-08 DIAGNOSIS — Z803 Family history of malignant neoplasm of breast: Secondary | ICD-10-CM | POA: Diagnosis not present

## 2018-09-08 DIAGNOSIS — Z1231 Encounter for screening mammogram for malignant neoplasm of breast: Secondary | ICD-10-CM | POA: Insufficient documentation

## 2018-09-14 ENCOUNTER — Telehealth: Payer: Self-pay

## 2018-09-14 NOTE — Telephone Encounter (Signed)
Patient had mammogram done 09/08/2018 at [facility]. They are waiting on priors to compare. Pt inquiring when they will be sent. [id number]

## 2018-09-19 ENCOUNTER — Encounter: Payer: Self-pay | Admitting: Obstetrics and Gynecology

## 2018-09-19 NOTE — Telephone Encounter (Signed)
LMVM to notify patient her priors have been sent to [facility]. She should be hearing from them if she hasn't already.

## 2018-09-20 ENCOUNTER — Encounter: Payer: Self-pay | Admitting: Family Medicine

## 2018-09-20 MED ORDER — BUDESONIDE 180 MCG/ACT IN AEPB: 2.0000 | INHALATION_SPRAY | Freq: Two times a day (BID) | RESPIRATORY_TRACT | 5 refills | Status: DC

## 2018-09-25 ENCOUNTER — Encounter: Payer: Self-pay | Admitting: Family Medicine

## 2018-09-29 ENCOUNTER — Ambulatory Visit (INDEPENDENT_AMBULATORY_CARE_PROVIDER_SITE_OTHER): Payer: BC Managed Care – PPO

## 2018-09-29 DIAGNOSIS — Z23 Encounter for immunization: Secondary | ICD-10-CM | POA: Diagnosis not present

## 2018-10-10 ENCOUNTER — Encounter: Payer: Self-pay | Admitting: Family Medicine

## 2018-10-10 ENCOUNTER — Other Ambulatory Visit: Payer: Self-pay | Admitting: Family Medicine

## 2018-10-10 MED ORDER — ESCITALOPRAM OXALATE 20 MG PO TABS: 10.0000 mg | ORAL_TABLET | Freq: Every day | ORAL | 1 refills | Status: DC

## 2018-10-17 ENCOUNTER — Encounter: Payer: Self-pay | Admitting: Obstetrics and Gynecology

## 2018-10-18 NOTE — Telephone Encounter (Signed)
Pls call pt to put on u/s sched tom at 12--I'll call her with results. I'm seeing her at 11:10 but she is a long pt appt. Thx.

## 2018-10-18 NOTE — Progress Notes (Deleted)
No chief complaint on file.    HPI:      [name] is a 43 y.o. G0P0000 who LMP was No LMP recorded., presents today for her annual examination.  Her menses are regular every 28-30 days, lasting 7 days, heavy for 2 days, although lighter this yr than last yr. Still has clots. She was mildly anemic 11/17 but normal CBC 6/18.  Dysmenorrhea mild, occurring first 1-2 days of flow.  She tried lysteda for menorrhagia in past with good sx relief, but pt has asthma and NSAIDs cause chest tightness for her. PMDD sx controlled this yr with lexapro. Wasn't working last yr and pt given xanax prn sx, but doesn't need it. Doing better this yr. Gets night sweats and fatigue wk before period. Having DUB this yr, heavy flow, increased cramps. 5/20 GYN u/s showed 4 cm leio. Anemic with low ferritin levels on 7/20 labs. Taking OTC iron supp.  Had thyroid labs due to sx and hx of thyroid nodules. Seeing endocrine since TSH and free T4 low and normal T3.  Sex activity: single partner, contraception - none. Hx of infertility.  Last Pap: February 05, 2015  Results were: no abnormalities /neg HPV DNA   Last mammogram: 09/08/18  Results were: normal--routine follow-up in 12 months.  There is a FH of breast cancer in her MGM and mat aunt. There is no FH of ovarian cancer. The patient does do self-breast exams. The patient is BRCA neg but Lynch positive (PMS2). IBIS=20.1%  2014.  Tobacco use: The patient denies current or previous tobacco use. Alcohol use: none Exercise: mod active  She does get adequate calcium and Vitamin D in her diet.    She is Lynch pos (PMS2).   Per 2020 NCCN guidelines,   Endometrial cancer:  Yearly EMB (neg 1/17; pt declined in past due to insurance, planneds to do it 01/2018 when she got new ins); Has been having AUB. Due today. Yearly u/s (done 5/20, EM=3.8 mm but obscured by leio with 7.1 x 7.1 cm simple LTO cyst; repeat due today due cyst)  Yearly Ca-125 (neg 5/20) due to  endometrial and ovarian cancer risk since not interested in hyst. Hyst: declines.  Small bowel/Colon cancer: Upper GI Q3-5 yrs, Colonoscopy Q1-2 yrs. Pt had done with Dr. Hilarie Fredrickson 3/18 and plans to do again in 2020. Her mom's colonoscopy was neg last yr.  Hepatobiliary cancers: no med mgmt guidelines    Past Medical History:  Diagnosis Date   Allergy    Anxiety    Asthma    Family history of breast cancer    Fibroid    Genetic testing of female    MyRisk Positive   Increased risk of breast cancer 2014, 2018   riskscore=22.2% 6/18   Infertility, female    Lynch syndrome    PMS2 pos 11/14, ATM VUS   Panic disorder     Past Surgical History:  Procedure Laterality Date   WISDOM TOOTH EXTRACTION      Family History  Problem Relation Age of Onset   Anxiety disorder Mother    Anxiety disorder Brother    Diabetes Maternal Grandfather    Breast cancer Maternal Grandmother 40   Breast cancer Other 20   Colon cancer Neg Hx    Stomach cancer Neg Hx     Social History   Socioeconomic History   Marital status: Married    Spouse name: Not on file   Number of children: Not on  file   Years of education: Not on file   Highest education level: Not on file  Occupational History   Not on file  Social Needs   Financial resource strain: Not on file   Food insecurity    Worry: Not on file    Inability: Not on file   Transportation needs    Medical: Not on file    Non-medical: Not on file  Tobacco Use   Smoking status: Never Smoker   Smokeless tobacco: Never Used  Substance and Sexual Activity   Alcohol use: No    Alcohol/week: 0.0 standard drinks   Drug use: No   Sexual activity: Yes    Partners: Male    Birth control/protection: None  Lifestyle   Physical activity    Days per week: Not on file    Minutes per session: Not on file   Stress: Not on file  Relationships   Social connections    Talks on phone: Not on file    Gets  together: Not on file    Attends religious service: Not on file    Active member of club or organization: Not on file    Attends meetings of clubs or organizations: Not on file    Relationship status: Not on file   Intimate partner violence    Fear of current or ex partner: Not on file    Emotionally abused: Not on file    Physically abused: Not on file    Forced sexual activity: Not on file  Other Topics Concern   Not on file  Social History Narrative   Not on file     Current Outpatient Medications:    albuterol (PROVENTIL) (2.5 MG/3ML) 0.083% nebulizer solution, Take 3 mLs (2.5 mg total) by nebulization every 6 (six) hours as needed for wheezing or shortness of breath., Disp: 150 mL, Rfl: 0   albuterol (VENTOLIN HFA) 108 (90 Base) MCG/ACT inhaler, USE 2 PUFFS EVERY 4 HOURS AS NEEDED FOR WHEEZING, Disp: 1 Inhaler, Rfl: 2   budesonide (PULMICORT) 180 MCG/ACT inhaler, Inhale 2 puffs into the lungs 2 (two) times daily., Disp: 1 each, Rfl: 5   diphenhydrAMINE (BENADRYL) 25 MG tablet, Take 25 mg by mouth every 6 (six) hours as needed., Disp: , Rfl:    escitalopram (LEXAPRO) 20 MG tablet, Take 0.5 tablets (10 mg total) by mouth daily., Disp: 45 tablet, Rfl: 1  Current Facility-Administered Medications:    0.9 %  sodium chloride infusion, 500 mL, Intravenous, Continuous, Pyrtle, Lajuan Lines, MD  ROS:  Review of Systems  Constitutional: Negative for fatigue, fever and unexpected weight change.  Respiratory: Negative for cough, shortness of breath and wheezing.   Cardiovascular: Negative for chest pain, palpitations and leg swelling.  Gastrointestinal: Negative for blood in stool, constipation, diarrhea, nausea and vomiting.  Endocrine: Negative for cold intolerance, heat intolerance and polyuria.  Genitourinary: Negative for dyspareunia, dysuria, flank pain, frequency, genital sores, hematuria, menstrual problem, pelvic pain, urgency, vaginal bleeding, vaginal discharge and vaginal  pain.  Musculoskeletal: Negative for back pain, joint swelling and myalgias.  Skin: Negative for rash.  Neurological: Negative for dizziness, syncope, light-headedness, numbness and headaches.  Hematological: Negative for adenopathy.  Psychiatric/Behavioral: Negative for agitation, confusion, sleep disturbance and suicidal ideas. The patient is not nervous/anxious.      Objective: There were no vitals taken for this visit.   Physical Exam Constitutional:      Appearance: She is well-developed.  Genitourinary:     Vagina and uterus  normal.     No vaginal discharge, erythema or tenderness.     No cervical motion tenderness or polyp.     Uterus is not enlarged or tender.     No right or left adnexal mass present.     Right adnexa not tender.     Left adnexa not tender.  Neck:     Musculoskeletal: Normal range of motion.     Thyroid: No thyromegaly.  Cardiovascular:     Rate and Rhythm: Normal rate and regular rhythm.     Heart sounds: Normal heart sounds. No murmur.  Pulmonary:     Effort: Pulmonary effort is normal.     Breath sounds: Normal breath sounds.  Chest:     Breasts:        Right: No mass, nipple discharge, skin change or tenderness.        Left: No mass, nipple discharge, skin change or tenderness.  Abdominal:     Palpations: Abdomen is soft.     Tenderness: There is no abdominal tenderness. There is no guarding.  Musculoskeletal: Normal range of motion.  Neurological:     Mental Status: She is alert and oriented to person, place, and time.     Cranial Nerves: No cranial nerve deficit.  Psychiatric:        Behavior: Behavior normal.  Vitals signs reviewed.     Assessment/Plan: No diagnosis found.             GYN counsel breast self exam, mammography screening, adequate intake of calcium and vitamin D, diet and exercise     F/U  No follow-ups on file.  Pandora Mccrackin B. Gentle Hoge, PA-C 10/18/2018 3:52 PM

## 2018-10-18 NOTE — Telephone Encounter (Signed)
Patient aware of ultrasound add on

## 2018-10-19 ENCOUNTER — Encounter: Payer: Self-pay | Admitting: Obstetrics and Gynecology

## 2018-10-19 ENCOUNTER — Other Ambulatory Visit: Payer: Self-pay

## 2018-10-19 ENCOUNTER — Ambulatory Visit: Payer: Medicaid Other | Admitting: Obstetrics and Gynecology

## 2018-10-19 ENCOUNTER — Ambulatory Visit (INDEPENDENT_AMBULATORY_CARE_PROVIDER_SITE_OTHER): Payer: BC Managed Care – PPO

## 2018-10-19 DIAGNOSIS — Z1509 Genetic susceptibility to other malignant neoplasm: Secondary | ICD-10-CM

## 2018-10-19 DIAGNOSIS — N83202 Unspecified ovarian cyst, left side: Secondary | ICD-10-CM | POA: Diagnosis not present

## 2018-10-19 DIAGNOSIS — D250 Submucous leiomyoma of uterus: Secondary | ICD-10-CM | POA: Diagnosis not present

## 2018-10-20 NOTE — Telephone Encounter (Signed)
Pt did not show for 11:10 annual/EMB/Lynch syndrome mgmt at before u/s appt. Did come in for u/s.  Was notified after u/s that EM stable and LTO cyst resolved. Pt still needs EMB and will do at rescheduled annual 10/20.

## 2018-10-25 ENCOUNTER — Encounter: Payer: Self-pay | Admitting: Obstetrics and Gynecology

## 2018-10-27 ENCOUNTER — Encounter: Payer: Self-pay | Admitting: Obstetrics and Gynecology

## 2018-10-30 NOTE — Progress Notes (Signed)
Chief Complaint  Patient presents with  . Gynecologic Exam     HPI:      [name] is a 43 y.o. G0P0000 who LMP was Patient's last menstrual period was 10/10/2018 (exact date)., presents today for her annual examination.  Her menses are regular every 28-30 days, lasting 7-10 days, heavy for 2 days, changing pads Q1 hr with clots. Anemic with low ferritin levels on 7/20 labs. Taking OTC iron supp and feels better.  Dysmenorrhea mild, occurring first 1-2 days of flow.  No meds needed. Had 2 months of DUB /BTB, last one 6/20. Tried lysteda for menorrhagia in past with good sx relief, but pt has asthma and caused chest tightness for her. Pt possibly interested in endometrial ablation for menorrhagia. Did sprintec OCPs in past with increased anxiety.   Pt with Lynch syndrome and due for EMB. 5/20 GYN u/s for DUB showed 4 cm leio and 7.1 cm LTO simple cyst. Repeat u/s 10/20 showed resolution of cyst. EM=9.7 mm.   Had thyroid labs due to sx and hx of thyroid nodules. Seeing endocrine since TSH and free T4 low and normal T3.  Sex activity: single partner, contraception - none. Hx of infertility.  Last Pap: February 05, 2015  Results were: no abnormalities /neg HPV DNA   Last mammogram: 09/08/18  Results were: normal--routine follow-up in 12 months.  There is a FH of breast cancer in her MGM and mat aunt. There is no FH of ovarian cancer. The patient does do self-breast exams. The patient is BRCA neg but Lynch positive (PMS2). IBIS=20.1%  2014. Has not had screening breast MRI.   Tobacco use: The patient denies current or previous tobacco use. Alcohol use: none  No drug use.  Exercise: mod active  She does get adequate calcium and Vitamin D in her diet.   She is Lynch pos (PMS2).   Per 2020 NCCN guidelines,   Endometrial cancer:  Yearly EMB (neg 1/17; pt declined in past due to insurance); done today.  Yearly u/s (done 5/20, EM=3.8 mm but obscured by leio with  7.1 cm simple LTO  cyst; 10/19/18 u/s EM=9.7 mm; LTO resolved, bilat ovar WNL, has 4 cm leio)   Hyst recommended. Pt considering but doesn't have time for recuperation currently.   Ovarian cancer: Recommend BSO age 21 or after childbearing; pt not interested currently but would consider with hyst. Still candidate for HRT.   Suggest yearly GYN u/s and ca-125--done 5/20 and 10/20   OCPs if pt doesn't want BSO--discussed as option for menorrhagia tx.   Small bowel/Colon cancer: Upper GI Q3-5 yrs, Colonoscopy Q1-2 yrs. Pt had done with Dr. Hilarie Fredrickson 3/18 and plans to do again next yr. Her mom's colonoscopy was neg last yr.  Hepatobiliary cancers: no med mgmt guidelines  Pancreatic cancers:no FH; no screening options available   Past Medical History:  Diagnosis Date  . Allergy   . Anxiety   . Asthma   . Family history of breast cancer   . Fibroid   . Genetic testing of female    MyRisk Positive  . Increased risk of breast cancer 2014, 2018   riskscore=22.2% 6/18  . Infertility, female   . Lynch syndrome    PMS2 pos 11/14, ATM VUS  . Panic disorder     Past Surgical History:  Procedure Laterality Date  . WISDOM TOOTH EXTRACTION      Family History  Problem Relation Age of Onset  . Anxiety disorder Mother   .  Anxiety disorder Brother   . Diabetes Maternal Grandfather   . Breast cancer Maternal Grandmother 50  . Breast cancer Other 45  . Colon cancer Neg Hx   . Stomach cancer Neg Hx     Social History   Socioeconomic History  . Marital status: Married    Spouse name: Not on file  . Number of children: Not on file  . Years of education: Not on file  . Highest education level: Not on file  Occupational History  . Not on file  Social Needs  . Financial resource strain: Not on file  . Food insecurity    Worry: Not on file    Inability: Not on file  . Transportation needs    Medical: Not on file    Non-medical: Not on file  Tobacco Use  . Smoking status: Never Smoker  . Smokeless  tobacco: Never Used  Substance and Sexual Activity  . Alcohol use: No    Alcohol/week: 0.0 standard drinks  . Drug use: No  . Sexual activity: Yes    Partners: Male    Birth control/protection: None  Lifestyle  . Physical activity    Days per week: Not on file    Minutes per session: Not on file  . Stress: Not on file  Relationships  . Social Herbalist on phone: Not on file    Gets together: Not on file    Attends religious service: Not on file    Active member of club or organization: Not on file    Attends meetings of clubs or organizations: Not on file    Relationship status: Not on file  . Intimate partner violence    Fear of current or ex partner: Not on file    Emotionally abused: Not on file    Physically abused: Not on file    Forced sexual activity: Not on file  Other Topics Concern  . Not on file  Social History Narrative  . Not on file     Current Outpatient Medications:  .  albuterol (VENTOLIN HFA) 108 (90 Base) MCG/ACT inhaler, USE 2 PUFFS EVERY 4 HOURS AS NEEDED FOR WHEEZING, Disp: 1 Inhaler, Rfl: 2 .  diphenhydrAMINE (BENADRYL) 25 MG tablet, Take 25 mg by mouth every 6 (six) hours as needed., Disp: , Rfl:  .  escitalopram (LEXAPRO) 20 MG tablet, Take 0.5 tablets (10 mg total) by mouth daily., Disp: 45 tablet, Rfl: 1 .  albuterol (PROVENTIL) (2.5 MG/3ML) 0.083% nebulizer solution, Take 3 mLs (2.5 mg total) by nebulization every 6 (six) hours as needed for wheezing or shortness of breath. (Patient not taking: Reported on 10/31/2018), Disp: 150 mL, Rfl: 0 .  budesonide (PULMICORT) 180 MCG/ACT inhaler, Inhale 2 puffs into the lungs 2 (two) times daily. (Patient not taking: Reported on 10/31/2018), Disp: 1 each, Rfl: 5  Current Facility-Administered Medications:  .  0.9 %  sodium chloride infusion, 500 mL, Intravenous, Continuous, Pyrtle, Lajuan Lines, MD  ROS:  Review of Systems  Constitutional: Negative for fatigue, fever and unexpected weight change.   Respiratory: Negative for cough, shortness of breath and wheezing.   Cardiovascular: Negative for chest pain, palpitations and leg swelling.  Gastrointestinal: Negative for blood in stool, constipation, diarrhea, nausea and vomiting.  Endocrine: Negative for cold intolerance, heat intolerance and polyuria.  Genitourinary: Positive for menstrual problem. Negative for dyspareunia, dysuria, flank pain, frequency, genital sores, hematuria, pelvic pain, urgency, vaginal bleeding, vaginal discharge and vaginal pain.  Musculoskeletal: Negative  for back pain, joint swelling and myalgias.  Skin: Negative for rash.  Neurological: Negative for dizziness, syncope, light-headedness, numbness and headaches.  Hematological: Negative for adenopathy.  Psychiatric/Behavioral: Negative for agitation, confusion, sleep disturbance and suicidal ideas. The patient is not nervous/anxious.      Objective: BP 96/66   Ht '5\' 5"'  (1.651 m)   Wt 141 lb (64 kg)   LMP 10/10/2018 (Exact Date)   BMI 23.46 kg/m    Physical Exam Constitutional:      Appearance: She is well-developed.  Genitourinary:     Vulva, vagina, uterus, right adnexa and left adnexa normal.     No vulval lesion or tenderness noted.     No vaginal discharge, erythema or tenderness.     No cervical motion tenderness or polyp.     Uterus is not enlarged or tender.     No right or left adnexal mass present.     Right adnexa not tender.     Left adnexa not tender.  Neck:     Musculoskeletal: Normal range of motion.     Thyroid: No thyromegaly.  Cardiovascular:     Rate and Rhythm: Normal rate and regular rhythm.     Heart sounds: Normal heart sounds. No murmur.  Pulmonary:     Effort: Pulmonary effort is normal.     Breath sounds: Normal breath sounds.  Chest:     Breasts:        Right: No mass, nipple discharge, skin change or tenderness.        Left: No mass, nipple discharge, skin change or tenderness.  Abdominal:     Palpations:  Abdomen is soft.     Tenderness: There is no abdominal tenderness. There is no guarding.  Musculoskeletal: Normal range of motion.  Neurological:     General: No focal deficit present.     Mental Status: She is alert and oriented to person, place, and time.     Cranial Nerves: No cranial nerve deficit.  Skin:    General: Skin is warm and dry.  Psychiatric:        Mood and Affect: Mood normal.        Behavior: Behavior normal.        Thought Content: Thought content normal.        Judgment: Judgment normal.  Vitals signs reviewed.   ENDOMETRIAL BIOPSY PROCEDURE NOTE:  Pipelle endometrial biopsy was performed using aseptic technique with iodine preparation.  The uterus was sounded to a length of 7.0 cm.  Adequate sampling was obtained with minimal blood loss.  The patient tolerated the procedure well.  Disposition will be pending pathology.   Assessment/Plan: Encounter for annual routine gynecological examination  Encounter for screening mammogram for malignant neoplasm of breast; pt current on mammo.   Family history of breast cancer--Pt is BRCA neg.  Increased risk of breast cancer--IBIS=20.1%. Discussed monthly SBE, yearly CBE and mammos, as well as scr breast MRI. Pt to consider MRI and f/u 12/20 if wants to sched before 2/21.  Abnormal uterine bleeding (AUB) - Plan: Surgical pathology; Check EMB today due to sx and Lynch. Will f/u with results. Question leio as etiology.  Menorrhagia with regular cycle--Discssed OCPs (best option) for cycle control and ovar protection. Also discussed IUD for sx control. Pt to consider and discuss further once EMB results obtained.   Lynch syndrome - Plan: Surgical pathology; EMB today. Current on GYN u/s, ca-125. Plans to do colonoscopy next yr with GI.  GYN counsel breast self exam, mammography screening, adequate intake of calcium and vitamin D, diet and exercise     F/U  Return in about 1 year (around 10/31/2019).  Stacey Maura  B. Artha Stavros, PA-C 10/31/2018 12:21 PM

## 2018-10-31 ENCOUNTER — Encounter: Payer: Self-pay | Admitting: Obstetrics and Gynecology

## 2018-10-31 ENCOUNTER — Ambulatory Visit (INDEPENDENT_AMBULATORY_CARE_PROVIDER_SITE_OTHER): Payer: BC Managed Care – PPO | Admitting: Obstetrics and Gynecology

## 2018-10-31 ENCOUNTER — Other Ambulatory Visit (HOSPITAL_COMMUNITY)
Admission: RE | Admit: 2018-10-31 | Discharge: 2018-10-31 | Disposition: A | Discharge status: Home / Self Care | Payer: BC Managed Care – PPO | Source: Ambulatory Visit | Attending: Obstetrics and Gynecology | Admitting: Obstetrics and Gynecology

## 2018-10-31 ENCOUNTER — Other Ambulatory Visit: Payer: Self-pay

## 2018-10-31 VITALS — BP 96/66 | Ht 65.0 in | Wt 141.0 lb

## 2018-10-31 DIAGNOSIS — N920 Excessive and frequent menstruation with regular cycle: Secondary | ICD-10-CM

## 2018-10-31 DIAGNOSIS — Z1509 Genetic susceptibility to other malignant neoplasm: Secondary | ICD-10-CM | POA: Insufficient documentation

## 2018-10-31 DIAGNOSIS — N939 Abnormal uterine and vaginal bleeding, unspecified: Secondary | ICD-10-CM | POA: Diagnosis not present

## 2018-10-31 DIAGNOSIS — Z803 Family history of malignant neoplasm of breast: Secondary | ICD-10-CM

## 2018-10-31 DIAGNOSIS — Z01419 Encounter for gynecological examination (general) (routine) without abnormal findings: Secondary | ICD-10-CM | POA: Diagnosis not present

## 2018-10-31 DIAGNOSIS — Z1231 Encounter for screening mammogram for malignant neoplasm of breast: Secondary | ICD-10-CM

## 2018-10-31 DIAGNOSIS — Z9189 Other specified personal risk factors, not elsewhere classified: Secondary | ICD-10-CM | POA: Insufficient documentation

## 2018-10-31 NOTE — Patient Instructions (Signed)
I value your feedback and entrusting us with your care. If you get a [facility] patient survey, I would appreciate you taking the time to let us know about your experience today. Thank you! 

## 2018-11-01 ENCOUNTER — Encounter: Payer: Self-pay | Admitting: Obstetrics and Gynecology

## 2018-11-02 ENCOUNTER — Other Ambulatory Visit: Payer: Self-pay | Admitting: Obstetrics and Gynecology

## 2018-11-02 ENCOUNTER — Encounter: Payer: Self-pay | Admitting: Obstetrics and Gynecology

## 2018-11-02 LAB — SURGICAL PATHOLOGY

## 2018-11-02 MED ORDER — LO LOESTRIN FE 1 MG-10 MCG / 10 MCG PO TABS: 1.0000 | ORAL_TABLET | Freq: Every day | ORAL | 3 refills | Status: DC

## 2018-11-02 NOTE — Progress Notes (Signed)
Rx Lo Loestrin eRxd for menorrhagia Start with next menses

## 2018-11-06 NOTE — Telephone Encounter (Signed)
Pt decided to go with OCPs, so pls cancel IUD insertion appt. Thx.

## 2018-11-06 NOTE — Telephone Encounter (Signed)
Called pt to get a little more information, but she said she is ok now, that she doesn't need to speak to [facility] anymore. Mentioned that she was undecided from OCP's and IUD and it was her anxiety getting to her and that hopefully the IUD will help her with the bleeding.

## 2018-11-06 NOTE — Telephone Encounter (Signed)
Pls call pt sched IUD insertion appt any day this wk. Thx.

## 2018-11-06 NOTE — Telephone Encounter (Signed)
Spoke with pt. Wanted to discuss pros/cons again of OCPs vs IUD. Given Lynch and ovar protection with OCPs, I would prefer she try them since easily reversible if has anxiety triggered by the hormones. If doesn't do well with them, will change to IUD. Pt agrees with plan. Will start OCPs with menses this Sun.

## 2018-11-06 NOTE — Telephone Encounter (Signed)
Patient is schedule Thursday, 11/09/18 with [facility] at 9:30 for Mirena insertion. Patient has additional questions she would like to speak with Elmo Putt Copland Prior to her appointment. Please advise

## 2018-11-07 ENCOUNTER — Encounter: Payer: Self-pay | Admitting: Family Medicine

## 2018-11-07 DIAGNOSIS — R7989 Other specified abnormal findings of blood chemistry: Secondary | ICD-10-CM

## 2018-11-07 DIAGNOSIS — R799 Abnormal finding of blood chemistry, unspecified: Secondary | ICD-10-CM

## 2018-11-09 ENCOUNTER — Ambulatory Visit: Payer: BC Managed Care – PPO | Admitting: Obstetrics and Gynecology

## 2018-11-16 ENCOUNTER — Encounter: Payer: Self-pay | Admitting: Obstetrics and Gynecology

## 2018-11-29 ENCOUNTER — Encounter: Payer: Self-pay | Admitting: Obstetrics and Gynecology

## 2018-12-06 ENCOUNTER — Encounter: Payer: Self-pay | Admitting: Family Medicine

## 2018-12-12 ENCOUNTER — Encounter: Payer: Self-pay | Admitting: Obstetrics and Gynecology

## 2018-12-19 ENCOUNTER — Encounter: Payer: Self-pay | Admitting: Obstetrics and Gynecology

## 2018-12-21 ENCOUNTER — Encounter: Payer: Self-pay | Admitting: Family Medicine

## 2018-12-22 ENCOUNTER — Other Ambulatory Visit: Payer: Self-pay

## 2018-12-22 ENCOUNTER — Telehealth: Payer: Self-pay | Admitting: *Deleted

## 2018-12-22 ENCOUNTER — Telehealth: Payer: Self-pay

## 2018-12-22 ENCOUNTER — Ambulatory Visit: Payer: BC Managed Care – PPO | Admitting: Family Medicine

## 2018-12-22 ENCOUNTER — Encounter: Payer: Self-pay | Admitting: Family Medicine

## 2018-12-22 ENCOUNTER — Ambulatory Visit (INDEPENDENT_AMBULATORY_CARE_PROVIDER_SITE_OTHER): Payer: BC Managed Care – PPO | Admitting: Family Medicine

## 2018-12-22 DIAGNOSIS — K219 Gastro-esophageal reflux disease without esophagitis: Secondary | ICD-10-CM | POA: Diagnosis not present

## 2018-12-22 DIAGNOSIS — B370 Candidal stomatitis: Secondary | ICD-10-CM | POA: Diagnosis not present

## 2018-12-22 MED ORDER — ASMANEX (60 METERED DOSES) 220 MCG/INH IN AEPB: 2.0000 | INHALATION_SPRAY | Freq: Every day | RESPIRATORY_TRACT | 12 refills | Status: DC

## 2018-12-22 MED ORDER — ESOMEPRAZOLE MAGNESIUM 40 MG PO PACK: 40.0000 mg | PACK | Freq: Every day | ORAL | 1 refills | Status: DC

## 2018-12-22 MED ORDER — OMEPRAZOLE 40 MG PO CPDR: 40.0000 mg | DELAYED_RELEASE_CAPSULE | Freq: Every day | ORAL | 1 refills | Status: DC

## 2018-12-22 MED ORDER — NYSTATIN 100000 UNIT/ML MT SUSP: 5.0000 mL | Freq: Four times a day (QID) | OROMUCOSAL | 0 refills | Status: DC

## 2018-12-22 NOTE — Telephone Encounter (Signed)
Omeprazole 40 mg sent to [facility] in [place] as instructed by Dr. Diona Browner.

## 2018-12-22 NOTE — Patient Instructions (Signed)

## 2018-12-22 NOTE — Telephone Encounter (Signed)
Patient called stating that Nexium is not going through with her insurance and would like to try Prilosec instead. Please review. She had an appointment with Dr Diona Browner this morning.

## 2018-12-22 NOTE — Telephone Encounter (Signed)
Call in omeprazole 40 mg daily #30 1 rf. Pt does not want OTC.

## 2018-12-22 NOTE — Progress Notes (Signed)
VIRTUAL VISIT Due to national recommendations of social distancing due to [place] 19, a virtual visit is felt to be most appropriate for this patient at this time.   I connected with the patient on 12/22/18 at  9:40 AM EST by virtual telehealth platform and verified that I am speaking with the correct person using two identifiers.   I discussed the limitations, risks, security and privacy concerns of performing an evaluation and management service by  virtual telehealth platform and the availability of in person appointments. I also discussed with the patient that there may be a patient responsible charge related to this service. The patient expressed understanding and agreed to proceed.  Patient location: Home Provider Location: [place] Participants: Eliezer Lofts and [name]   Chief Complaint  Patient presents with  . Gastroesophageal Reflux  . Thrush    History of Present Illness:  43 year old female patient of Dr. Lillie Fragmin presents with new onset GERD x 1 week.  With every meal she has burning in throat, reflux.  Causes her to feel short of breath.  She has been having white plaque on tounge this Am.Marland Kitchen She has had in past... has triggered her reflux.  No chest pain, no abd pain.   She has tried Tums.. helps slightly. No clear triggers.. does drink soda  She  does use budesonide... try to rinse. Cannot get from pharmacy now.. ? Prior authorization.    COVID 19 screen No recent travel or known exposure to COVID19 The patient denies respiratory symptoms of COVID 19 at this time.  The importance of social distancing was discussed today.   Review of Systems  Constitutional: Negative for chills and fever.  HENT: Negative for congestion and ear pain.   Eyes: Negative for pain and redness.  Respiratory: Negative for cough and shortness of breath.   Cardiovascular: Negative for chest pain, palpitations and leg swelling.  Gastrointestinal: Negative for abdominal  pain, blood in stool, constipation, diarrhea, nausea and vomiting.  Genitourinary: Negative for dysuria.  Musculoskeletal: Negative for falls and myalgias.  Skin: Negative for rash.  Neurological: Negative for dizziness.  Psychiatric/Behavioral: Negative for depression. The patient is not nervous/anxious.       Past Medical History:  Diagnosis Date  . Allergy   . Anxiety   . Asthma   . Family history of breast cancer   . Fibroid   . Genetic testing of female    MyRisk Positive  . Increased risk of breast cancer 2014, 2018   riskscore=22.2% 6/18  . Infertility, female   . Lynch syndrome    PMS2 pos 11/14, ATM VUS  . Panic disorder     reports that she has never smoked. She has never used smokeless tobacco. She reports that she does not drink alcohol or use drugs.   Current Outpatient Medications:  .  albuterol (PROVENTIL) (2.5 MG/3ML) 0.083% nebulizer solution, Take 3 mLs (2.5 mg total) by nebulization every 6 (six) hours as needed for wheezing or shortness of breath., Disp: 150 mL, Rfl: 0 .  albuterol (VENTOLIN HFA) 108 (90 Base) MCG/ACT inhaler, USE 2 PUFFS EVERY 4 HOURS AS NEEDED FOR WHEEZING, Disp: 1 Inhaler, Rfl: 2 .  diphenhydrAMINE (BENADRYL) 25 MG tablet, Take 25 mg by mouth every 6 (six) hours as needed., Disp: , Rfl:  .  escitalopram (LEXAPRO) 20 MG tablet, Take 0.5 tablets (10 mg total) by mouth daily., Disp: 45 tablet, Rfl: 1 .  Norethindrone-Ethinyl Estradiol-Fe Biphas (LO LOESTRIN FE) 1  MG-10 MCG / 10 MCG tablet, Take 1 tablet by mouth daily., Disp: 84 tablet, Rfl: 3 .  budesonide (PULMICORT) 180 MCG/ACT inhaler, Inhale 2 puffs into the lungs 2 (two) times daily. (Patient not taking: Reported on 10/31/2018), Disp: 1 each, Rfl: 5  Current Facility-Administered Medications:  .  0.9 %  sodium chloride infusion, 500 mL, Intravenous, Continuous, Pyrtle, Lajuan Lines, MD   Observations/Objective: Pulse 82, temperature 97.8 F (36.6 C), temperature source Oral, height '5\' 5"'   (1.651 m), last menstrual period 12/06/2018, SpO2 99 %.  Physical Exam Constitutional:      General: The patient is not in acute distress. WHIT plaque on tounge and on cheeks. Pulmonary:     Effort: Pulmonary effort is normal. No respiratory distress.  Neurological:     Mental Status: The patient is alert and oriented to person, place, and time.  Psychiatric:        Mood and Affect: Mood normal.        Behavior: Behavior normal.    Assessment and Plan GERD  4-6 week course of PPIU. Avoid triggers. Treat thrush.  Thrush Treat with oral nystatin swish and swallow small amount.     I discussed the assessment and treatment plan with the patient. The patient was provided an opportunity to ask questions and all were answered. The patient agreed with the plan and demonstrated an understanding of the instructions.   The patient was advised to call back or seek an in-person evaluation if the symptoms worsen or if the condition fails to improve as anticipated.     Eliezer Lofts, MD

## 2018-12-22 NOTE — Telephone Encounter (Signed)
Spoke with [employer].  They state her insurance will cover Asmanex, Arnuity or Flovent.  Spoke with patient and she would like to try Asmanex.  [facility] [place].

## 2018-12-22 NOTE — Assessment & Plan Note (Signed)
Treat with oral nystatin swish and swallow small amount.

## 2018-12-22 NOTE — Progress Notes (Signed)
Spoke with [employer].  They state her insurance will cover Asmanex, Arnuity or Flovent.  Please advise.

## 2018-12-22 NOTE — Progress Notes (Signed)
Spoke with patient.  She would like to try the Asmanex.  [facility] [place].

## 2018-12-22 NOTE — Assessment & Plan Note (Signed)
4-6 week course of PPIU. Avoid triggers. Treat thrush.

## 2018-12-24 ENCOUNTER — Encounter: Payer: Self-pay | Admitting: Obstetrics and Gynecology

## 2018-12-25 ENCOUNTER — Encounter: Payer: Self-pay | Admitting: Obstetrics and Gynecology

## 2018-12-25 ENCOUNTER — Encounter: Payer: Self-pay | Admitting: Family Medicine

## 2018-12-25 ENCOUNTER — Encounter: Payer: Self-pay | Admitting: *Deleted

## 2018-12-25 MED ORDER — ALBUTEROL SULFATE HFA 108 (90 BASE) MCG/ACT IN AERS: INHALATION_SPRAY | RESPIRATORY_TRACT | 2 refills | Status: DC

## 2018-12-25 MED ORDER — ESOMEPRAZOLE MAGNESIUM 40 MG PO CPDR: 40.0000 mg | DELAYED_RELEASE_CAPSULE | Freq: Every day | ORAL | 1 refills | Status: DC

## 2018-12-25 NOTE — Addendum Note (Signed)
Addended by: Carter Kitten on: 12/25/2018 08:05 AM   Modules accepted: Orders

## 2018-12-25 NOTE — Progress Notes (Addendum)
It appears Asmanex conversion has already taken place  Electronically Signed  By: Owens Loffler, MD On: 12/25/2018 9:03 AM   This note is not being shared with the patient for the following reason: To respect privacy (The patient or proxy has requested that the information not be shared).  Unable to do in [facility], attempted.

## 2018-12-25 NOTE — Addendum Note (Signed)
Addended by: Carter Kitten on: 12/25/2018 12:10 PM   Modules accepted: Orders

## 2018-12-27 ENCOUNTER — Other Ambulatory Visit: Payer: Self-pay

## 2018-12-27 ENCOUNTER — Encounter: Payer: Self-pay | Admitting: Family Medicine

## 2018-12-27 ENCOUNTER — Ambulatory Visit: Payer: BC Managed Care – PPO | Admitting: Family Medicine

## 2018-12-27 VITALS — BP 100/60 | HR 71 | Temp 98.3°F | Ht 65.0 in | Wt 132.0 lb

## 2018-12-27 DIAGNOSIS — J4540 Moderate persistent asthma, uncomplicated: Secondary | ICD-10-CM

## 2018-12-27 DIAGNOSIS — K219 Gastro-esophageal reflux disease without esophagitis: Secondary | ICD-10-CM | POA: Diagnosis not present

## 2018-12-27 NOTE — Progress Notes (Signed)
Gloria Rush T. Gloria Heiner, MD Primary Care and [place] at [facility] [place], [address] Phone: [phone]  FAX: [place] - 43 y.o. female  MRN [id number]  Date of Birth: Aug 23, 1975  Visit Date: 12/27/2018  PCP: Owens Loffler, MD  Referred by: Owens Loffler, MD  Chief Complaint  Patient presents with  . Gastroesophageal Reflux    Wants lungs checked    This visit occurred during the SARS-CoV-2 public health emergency.  Safety protocols were in place, including screening questions prior to the visit, additional usage of staff PPE, and extensive cleaning of exam room while observing appropriate contact time as indicated for disinfecting solutions.   Subjective:   Gloria Rush is a 43 y.o. very pleasant female patient who presents with the following:  Gloria Rush is a very nice well-known patient, and she presents with some questionable shortness of breath.  She has some reflux that is been quite significant for years, but it was better for about 2 years after she had some endoscopy.  She is feeling it in her throat and also to a lesser extent in her upper abdomen.  She needs to pick up her Asmanex today, and she also is picking up her Nexium.  She has been using her albuterol inhaler approximately once every day.  She also is having some ongoing anxiety that is heightened during COVID-19.  She is on Lexapro at baseline.  ? Listen to her lungs.  Albuterol once a day.   Nexium - not taking anything for it. Does have a hiatal hernia.  Got better after EGD.  Past Medical History, Surgical History, Social History, Family History, Problem List, Medications, and Allergies have been reviewed and updated if relevant.  Patient Active Problem List   Diagnosis Date Noted  . Lynch syndrome 04/13/2013    Priority: High  . Major depressive disorder, recurrent, in full remission ([place]) 12/18/2009    Priority:  Medium  . Panic disorder without agoraphobia with panic attacks in partial remission 01/23/2008    Priority: Medium  . Asthma, moderate persistent, well-controlled 12/27/2006    Priority: Medium  . Abnormal uterine bleeding (AUB) 10/31/2018  . Increased risk of breast cancer 10/31/2018  . Menorrhagia with regular cycle 06/11/2016  . Family history of breast cancer 06/11/2016  . Unspecified vitamin D deficiency 08/24/2012  . ALLERGIC CONJUNCTIVITIS 12/23/2008  . DERMATITIS, ATOPIC 12/23/2008  . GERD 12/05/2007  . Allergic rhinitis 12/27/2006    Past Medical History:  Diagnosis Date  . Allergy   . Anxiety   . Asthma   . Family history of breast cancer   . Fibroid   . Genetic testing of female    MyRisk Positive  . Increased risk of breast cancer 2014, 2018   riskscore=22.2% 6/18  . Infertility, female   . Lynch syndrome    PMS2 pos 11/14, ATM VUS  . Panic disorder     Past Surgical History:  Procedure Laterality Date  . WISDOM TOOTH EXTRACTION      Social History   Socioeconomic History  . Marital status: Married    Spouse name: Not on file  . Number of children: Not on file  . Years of education: Not on file  . Highest education level: Not on file  Occupational History  . Not on file  Tobacco Use  . Smoking status: Never Smoker  . Smokeless tobacco: Never Used  Substance and Sexual Activity  .  Alcohol use: No    Alcohol/week: 0.0 standard drinks  . Drug use: No  . Sexual activity: Yes    Partners: Male    Birth control/protection: None  Other Topics Concern  . Not on file  Social History Narrative  . Not on file   Social Determinants of Health   Financial Resource Strain:   . Difficulty of Paying Living Expenses: Not on file  Food Insecurity:   . Worried About Charity fundraiser in the Last Year: Not on file  . Ran Out of Food in the Last Year: Not on file  Transportation Needs:   . Lack of Transportation (Medical): Not on file  . Lack of  Transportation (Non-Medical): Not on file  Physical Activity:   . Days of Exercise per Week: Not on file  . Minutes of Exercise per Session: Not on file  Stress:   . Feeling of Stress : Not on file  Social Connections:   . Frequency of Communication with Friends and Family: Not on file  . Frequency of Social Gatherings with Friends and Family: Not on file  . Attends Religious Services: Not on file  . Active Member of Clubs or Organizations: Not on file  . Attends Archivist Meetings: Not on file  . Marital Status: Not on file  Intimate Partner Violence:   . Fear of Current or Ex-Partner: Not on file  . Emotionally Abused: Not on file  . Physically Abused: Not on file  . Sexually Abused: Not on file    Family History  Problem Relation Age of Onset  . Anxiety disorder Mother   . Anxiety disorder Brother   . Diabetes Maternal Grandfather   . Breast cancer Maternal Grandmother 74  . Breast cancer Other 61  . Colon cancer Neg Hx   . Stomach cancer Neg Hx     Allergies  Allergen Reactions  . Amoxicillin     Itching (once), no rash Rapid heart rate  . Chicken Meat (Diagnostic) Other (See Comments)    Throat feels tight and GERD  . Dexilant [Dexlansoprazole] Other (See Comments)    Throat feels tight    Medication list reviewed and updated in full in [place].   GEN: No acute illnesses, no fevers, chills. GI: No n/v/d, eating normally Pulm: No SOB Interactive and getting along well at home.  Otherwise, ROS is as per the HPI.  Objective:   BP 100/60   Pulse 71   Temp 98.3 F (36.8 C) (Temporal)   Ht '5\' 5"'  (1.651 m)   Wt 132 lb (59.9 kg)   LMP 12/06/2018   SpO2 99%   BMI 21.97 kg/m   GEN: WDWN, NAD, Non-toxic, A & O x 3 HEENT: Atraumatic, Normocephalic. Neck supple. No masses, No LAD. Ears and Nose: No external deformity. CV: RRR, No M/G/R. No JVD. No thrill. No extra heart sounds. PULM: CTA B, no wheezes, crackles, rhonchi. No retractions.  No resp. distress. No accessory muscle use. ABD: S, minimal to mild tenderness in the epigastric region, ND, + BS, No rebound, No HSM  EXTR: No c/c/e NEURO Normal gait.  PSYCH: Normally interactive. Conversant. Not depressed or anxious appearing.  Calm demeanor.   Laboratory and Imaging Data:  Assessment and Plan:     ICD-10-CM   1. Asthma, moderate persistent, well-controlled  J45.40   2. Gastroesophageal reflux disease without esophagitis  K21.9    Is unclear if this is worsened secondary to asthma with cold  or if this is from reflux.  Nevertheless she is going to start her Nexium and continue with inhaled corticosteroid, now on Asmanex.  Reassurance.  Follow-up: No follow-ups on file.  No orders of the defined types were placed in this encounter.  No orders of the defined types were placed in this encounter.   Signed,  Maud Deed. Ardith Lewman, MD   Outpatient Encounter Medications as of 12/27/2018  Medication Sig  . albuterol (PROVENTIL) (2.5 MG/3ML) 0.083% nebulizer solution Take 3 mLs (2.5 mg total) by nebulization every 6 (six) hours as needed for wheezing or shortness of breath.  Marland Kitchen albuterol (VENTOLIN HFA) 108 (90 Base) MCG/ACT inhaler USE 2 PUFFS EVERY 4 HOURS AS NEEDED FOR WHEEZING  . diphenhydrAMINE (BENADRYL) 25 MG tablet Take 25 mg by mouth every 6 (six) hours as needed.  Marland Kitchen escitalopram (LEXAPRO) 20 MG tablet Take 0.5 tablets (10 mg total) by mouth daily.  Marland Kitchen esomeprazole (NEXIUM) 40 MG capsule Take 1 capsule (40 mg total) by mouth daily.  . mometasone (ASMANEX, 60 METERED DOSES,) 220 MCG/INH inhaler Inhale 2 puffs into the lungs daily.  . Norethindrone-Ethinyl Estradiol-Fe Biphas (LO LOESTRIN FE) 1 MG-10 MCG / 10 MCG tablet Take 1 tablet by mouth daily.  Marland Kitchen nystatin (MYCOSTATIN) 100000 UNIT/ML suspension Take 5 mLs (500,000 Units total) by mouth 4 (four) times daily.   Facility-Administered Encounter Medications as of 12/27/2018  Medication  . 0.9 %  sodium chloride  infusion

## 2018-12-28 ENCOUNTER — Encounter: Payer: Self-pay | Admitting: Family Medicine

## 2019-01-04 ENCOUNTER — Other Ambulatory Visit: Payer: Self-pay

## 2019-01-04 ENCOUNTER — Encounter: Payer: Self-pay | Admitting: Intensive Care

## 2019-01-04 ENCOUNTER — Emergency Department: Payer: BC Managed Care – PPO

## 2019-01-04 ENCOUNTER — Emergency Department
Admission: EM | Admit: 2019-01-04 | Discharge: 2019-01-05 | Disposition: A | Discharge status: Home / Self Care | Payer: BC Managed Care – PPO | Attending: Emergency Medicine | Admitting: Emergency Medicine

## 2019-01-04 ENCOUNTER — Encounter: Payer: Self-pay | Admitting: Family Medicine

## 2019-01-04 DIAGNOSIS — K219 Gastro-esophageal reflux disease without esophagitis: Secondary | ICD-10-CM | POA: Insufficient documentation

## 2019-01-04 DIAGNOSIS — Z79899 Other long term (current) drug therapy: Secondary | ICD-10-CM | POA: Insufficient documentation

## 2019-01-04 DIAGNOSIS — F419 Anxiety disorder, unspecified: Secondary | ICD-10-CM | POA: Diagnosis present

## 2019-01-04 DIAGNOSIS — J45909 Unspecified asthma, uncomplicated: Secondary | ICD-10-CM | POA: Insufficient documentation

## 2019-01-04 MED ORDER — HYDROXYZINE HCL 10 MG PO TABS: 10.0000 mg | ORAL_TABLET | Freq: Three times a day (TID) | ORAL | 0 refills | Status: DC | PRN

## 2019-01-04 MED ORDER — LIDOCAINE VISCOUS HCL 2 % MT SOLN
15.0000 mL | Freq: Once | OROMUCOSAL | Status: AC
  Administered 2019-01-04: 15 mL via ORAL
  Filled 2019-01-04: qty 15

## 2019-01-04 MED ORDER — ALUM & MAG HYDROXIDE-SIMETH 200-200-20 MG/5ML PO SUSP
30.0000 mL | Freq: Once | ORAL | Status: AC
  Administered 2019-01-04: 30 mL via ORAL
  Filled 2019-01-04: qty 30

## 2019-01-04 MED ORDER — PREDNISONE 20 MG PO TABS
60.0000 mg | ORAL_TABLET | Freq: Once | ORAL | Status: AC
  Administered 2019-01-04: 60 mg via ORAL
  Filled 2019-01-04: qty 3

## 2019-01-04 MED ORDER — LORAZEPAM 1 MG PO TABS
1.0000 mg | ORAL_TABLET | Freq: Once | ORAL | Status: AC
  Administered 2019-01-04: 1 mg via ORAL
  Filled 2019-01-04: qty 1

## 2019-01-04 NOTE — ED Notes (Signed)
Pt states she does not like the way it makes her throat feel. Pt continues to belch and clear her throat compulsively. Pt states she wishes she could get something for her anxiety.

## 2019-01-04 NOTE — ED Provider Notes (Signed)
[facility] Emergency Department Provider Note  ____________________________________________  Time seen: Approximately 10:19 PM  I have reviewed the triage vital signs and the nursing notes.   HISTORY  Chief Complaint Anxiety    HPI Gloria Rush is a 43 y.o. female who presents the emergency department concern for chest tightness following a panic attack.  Patient states that she has significant anxiety about her health.  She has had increased reflux symptoms, some increase in her asthma symptoms recently leading to a panic attack last night.  Patient reports that she woke up this morning and had a tight sensation around her rib region.  This is constant in nature.  Not reproducible with movement or breathing.  Patient also has experienced increase in her reflux symptoms today with increased regurg and belching.  Patient denies any substernal chest pain.  No history of cardiac problems.  Patient states that she has bad anxiety about her health and that this "spirals" when 1 symptoms starts to affect another.  Patient is presenting to the emergency department for evaluation as she wants to ensure that she does not have any evidence of increased asthma symptoms needing medication or evidence of bronchitis/pneumonia.  Patient has taken her antianxiety medication today.  Patient recently started Nexium for GERD,  Asmanex for her asthma.        Past Medical History:  Diagnosis Date  . Allergy   . Anxiety   . Asthma   . Family history of breast cancer   . Fibroid   . Genetic testing of female    MyRisk Positive  . Increased risk of breast cancer 2014, 2018   riskscore=22.2% 6/18  . Infertility, female   . Lynch syndrome    PMS2 pos 11/14, ATM VUS  . Panic disorder     Patient Active Problem List   Diagnosis Date Noted  . Abnormal uterine bleeding (AUB) 10/31/2018  . Increased risk of breast cancer 10/31/2018  . Menorrhagia with regular cycle 06/11/2016   . Family history of breast cancer 06/11/2016  . Lynch syndrome 04/13/2013  . Unspecified vitamin D deficiency 08/24/2012  . Major depressive disorder, recurrent, in full remission ([place]) 12/18/2009  . ALLERGIC CONJUNCTIVITIS 12/23/2008  . DERMATITIS, ATOPIC 12/23/2008  . Panic disorder without agoraphobia with panic attacks in partial remission 01/23/2008  . GERD 12/05/2007  . Allergic rhinitis 12/27/2006  . Asthma, moderate persistent, well-controlled 12/27/2006    Past Surgical History:  Procedure Laterality Date  . WISDOM TOOTH EXTRACTION      Prior to Admission medications   Medication Sig Start Date End Date Taking? Authorizing Provider  albuterol (PROVENTIL) (2.5 MG/3ML) 0.083% nebulizer solution Take 3 mLs (2.5 mg total) by nebulization every 6 (six) hours as needed for wheezing or shortness of breath. 11/11/17   Darlin Priestly, PA-C  albuterol (VENTOLIN HFA) 108 (90 Base) MCG/ACT inhaler USE 2 PUFFS EVERY 4 HOURS AS NEEDED FOR WHEEZING 12/25/18   Copland, Frederico Hamman, MD  diphenhydrAMINE (BENADRYL) 25 MG tablet Take 25 mg by mouth every 6 (six) hours as needed.    [provider]  escitalopram (LEXAPRO) 20 MG tablet Take 0.5 tablets (10 mg total) by mouth daily. 10/10/18   Copland, Frederico Hamman, MD  esomeprazole (NEXIUM) 40 MG capsule Take 1 capsule (40 mg total) by mouth daily. 12/25/18   Bedsole, Amy E, MD  hydrOXYzine (ATARAX/VISTARIL) 10 MG tablet Take 1 tablet (10 mg total) by mouth 3 (three) times daily as needed for anxiety. 01/04/19   ,  Charline Bills, PA-C  mometasone (ASMANEX, 60 METERED DOSES,) 220 MCG/INH inhaler Inhale 2 puffs into the lungs daily. 12/22/18   Bedsole, Amy E, MD  Norethindrone-Ethinyl Estradiol-Fe Biphas (LO LOESTRIN FE) 1 MG-10 MCG / 10 MCG tablet Take 1 tablet by mouth daily. 15/61/53   Copland, Deirdre Evener, PA-C  nystatin (MYCOSTATIN) 100000 UNIT/ML suspension Take 5 mLs (500,000 Units total) by mouth 4 (four) times daily. 12/22/18   Jinny Sanders, MD    Allergies Amoxicillin, Chicken meat (diagnostic), and Dexilant [dexlansoprazole]  Family History  Problem Relation Age of Onset  . Anxiety disorder Mother   . Anxiety disorder Brother   . Diabetes Maternal Grandfather   . Breast cancer Maternal Grandmother 16  . Breast cancer Other 58  . Colon cancer Neg Hx   . Stomach cancer Neg Hx     Social History Social History   Tobacco Use  . Smoking status: Never Smoker  . Smokeless tobacco: Never Used  Substance Use Topics  . Alcohol use: No    Alcohol/week: 0.0 standard drinks  . Drug use: No     Review of Systems  Constitutional: No fever/chills Eyes: No visual changes. No discharge ENT: No upper respiratory complaints. Cardiovascular: no substernal chest pain.  Positive for chest tightness. Respiratory: no cough. No SOB. Gastrointestinal: Increased reflux symptoms with increased belching.  No abdominal pain.  No nausea, no vomiting.  Musculoskeletal: Negative for musculoskeletal pain. Skin: Negative for rash, abrasions, lacerations, ecchymosis. Neurological: Negative for headaches, focal weakness or numbness. 10-point ROS otherwise negative.  ____________________________________________   PHYSICAL EXAM:  VITAL SIGNS: ED Triage Vitals  Enc Vitals Group     BP 01/04/19 1834 (!) 129/94     Pulse Rate 01/04/19 1834 (!) 106     Resp 01/04/19 1834 18     Temp 01/04/19 1834 98.7 F (37.1 C)     Temp Source 01/04/19 1834 Oral     SpO2 01/04/19 1834 100 %     Weight 01/04/19 1835 132 lb (59.9 kg)     Height 01/04/19 1835 '5\' 5"'  (1.651 m)     Head Circumference --      Peak Flow --      Pain Score 01/04/19 1840 4     Pain Loc --      Pain Edu? --      Excl. in [place]? --      Constitutional: Alert and oriented. Well appearing and in no acute distress. Eyes: Conjunctivae are normal. PERRL. EOMI. Head: Atraumatic. ENT:      Ears:       Nose: No congestion/rhinnorhea.      Mouth/Throat: Mucous membranes are  moist.  Oropharynx is nonerythematous and nonedematous.  Uvula is midline. Neck: No stridor.  Neck is supple full range of motion Hematological/Lymphatic/Immunilogical: No cervical lymphadenopathy. Cardiovascular: Normal rate, regular rhythm. Normal S1 and S2.  Good peripheral circulation. Respiratory: Normal respiratory effort without tachypnea or retractions. Lungs CTAB. Good air entry to the bases with no decreased or absent breath sounds. Gastrointestinal: Bowel sounds 4 quadrants. Soft and nontender to palpation. No guarding or rigidity. No palpable masses. No distention. No CVA tenderness. Musculoskeletal: Full range of motion to all extremities. No gross deformities appreciated. Neurologic:  Normal speech and language. No gross focal neurologic deficits are appreciated.  Skin:  Skin is warm, dry and intact. No rash noted. Psychiatric: Mood and affect are anxious.  Speech and behavior are normal. Patient exhibits appropriate insight and judgement.  ____________________________________________   LABS (all labs ordered are listed, but only abnormal results are displayed)  Labs Reviewed - No data to display ____________________________________________  EKG   ____________________________________________  RADIOLOGY I personally viewed and evaluated these images as part of my medical decision making, as well as reviewing the written report by the radiologist.  DG Chest 2 View  Result Date: 01/04/2019 CLINICAL DATA:  43 year old female with chest discomfort and deep breath. EXAM: CHEST - 2 VIEW COMPARISON:  Chest radiograph dated 12/27/2013. FINDINGS: There is no focal consolidation, pleural effusion, or pneumothorax. The cardiac silhouette is within normal limits. No acute osseous pathology. IMPRESSION: No active cardiopulmonary disease. Electronically Signed   By: Anner Crete M.D.   On: 01/04/2019 19:06     ____________________________________________    PROCEDURES  Procedure(s) performed:    Procedures    Medications  alum & mag hydroxide-simeth (MAALOX/MYLANTA) 200-200-20 MG/5ML suspension 30 mL (30 mLs Oral Given 01/04/19 2332)    And  lidocaine (XYLOCAINE) 2 % viscous mouth solution 15 mL (15 mLs Oral Given 01/04/19 2332)  predniSONE (DELTASONE) tablet 60 mg (60 mg Oral Given 01/04/19 2330)  LORazepam (ATIVAN) tablet 1 mg (1 mg Oral Given 01/04/19 2346)     ____________________________________________   INITIAL IMPRESSION / ASSESSMENT AND PLAN / ED COURSE  Pertinent labs & imaging results that were available during my care of the patient were reviewed by me and considered in my medical decision making (see chart for details).  Review of the [place] CSRS was performed in accordance of the [place] prior to dispensing any controlled drugs.           Patient's diagnosis is consistent with anxiety, acid reflux.  Patient presents emergency department with significant anxiety, feeling of chest tightness, significant regurg and belching.  Patient has a longstanding history of asthma, anxiety, GERD.  Findings were most consistent with combination of anxiety and reflux.  Patient was given GI cocktail which significantly improved the patient's chest tightness.  Patient was given Ativan to further assist and her anxiety.  At this time, no indication for labs or advanced imaging.  Chest x-ray was reassuring and patient's exam is reassuring at this time.  I discussed the patient's conditions and she feels reassured at this time.  Return precautions have been discussed with the patient.  Patient routinely takes Benadryl for her anxiety and I have discussed use of Atarax instead.  Prescription for Atarax is given to the patient at this time.  Patient feels reassured, and is stable for discharge at this time.  Patient is given ED precautions to return to the ED for any worsening or new  symptoms.     ____________________________________________  FINAL CLINICAL IMPRESSION(S) / ED DIAGNOSES  Final diagnoses:  Anxiety  Gastroesophageal reflux disease, unspecified whether esophagitis present      NEW MEDICATIONS STARTED DURING THIS VISIT:  ED Discharge Orders         Ordered    hydrOXYzine (ATARAX/VISTARIL) 10 MG tablet  3 times daily PRN     01/04/19 2359              This chart was dictated using voice recognition software/Dragon. Despite best efforts to proofread, errors can occur which can change the meaning. Any change was purely unintentional.    Darletta Moll, PA-C 01/05/19 0019    Arta Silence, MD 01/05/19 [phone]

## 2019-01-04 NOTE — ED Notes (Signed)
Pt doing a lot of throat clearing and belching.

## 2019-01-04 NOTE — ED Notes (Signed)
Pt approached RN inquiring how long it will be for her to be seen, RN informed pt that we are working as fast as we can to see patients. Pt asked RN if this RN can go over her x-ray RN informed pt that unfortunately RN is not able to go over results as this is out of her scope of practice but MD will be able to reviewed it with pt, pt verbalizes understanding

## 2019-01-04 NOTE — ED Triage Notes (Addendum)
Patient c/o anxiety attack the last 24 hours. Reports taking lexapro before coming to ER for anxiety. Patient very anxious in triage. Reports discomfort in lungs when she breaths in. Reports her discomfort feels like her normal acid reflux and anxiety pain. Patient requesting chest xray. HX asthma.

## 2019-01-06 ENCOUNTER — Encounter: Payer: Self-pay | Admitting: Obstetrics and Gynecology

## 2019-01-08 ENCOUNTER — Encounter: Payer: Self-pay | Admitting: Family Medicine

## 2019-01-09 ENCOUNTER — Telehealth: Payer: Self-pay | Admitting: Family Medicine

## 2019-01-09 MED ORDER — ASMANEX HFA 200 MCG/ACT IN AERO: 2.0000 | INHALATION_SPRAY | Freq: Every day | RESPIRATORY_TRACT | 2 refills | Status: DC

## 2019-01-09 MED ORDER — ASMANEX HFA 200 MCG/ACT IN AERO: 2.0000 | INHALATION_SPRAY | Freq: Every day | RESPIRATORY_TRACT | 5 refills | Status: DC

## 2019-01-09 NOTE — Telephone Encounter (Signed)
Sent in Lantana HFA as requested by pt in place of [facility].

## 2019-01-09 NOTE — Telephone Encounter (Signed)
Ok to change to Asmanex HFA 200 mcg?

## 2019-01-10 MED ORDER — ASMANEX HFA 200 MCG/ACT IN AERO: 2.0000 | INHALATION_SPRAY | Freq: Every day | RESPIRATORY_TRACT | 5 refills | Status: DC

## 2019-01-10 NOTE — Telephone Encounter (Signed)
Rx sent to wrong pharmacy. I resent Rx to [facility] per patient's request.

## 2019-01-10 NOTE — Addendum Note (Signed)
Addended by: Carter Kitten on: 01/10/2019 08:14 AM   Modules accepted: Orders

## 2019-01-19 ENCOUNTER — Encounter: Payer: Self-pay | Admitting: Obstetrics and Gynecology

## 2019-01-19 ENCOUNTER — Encounter: Payer: Self-pay | Admitting: *Deleted

## 2019-01-19 MED ORDER — FLOVENT HFA 220 MCG/ACT IN AERO: 1.0000 | INHALATION_SPRAY | Freq: Two times a day (BID) | RESPIRATORY_TRACT | 5 refills | Status: DC

## 2019-01-19 NOTE — Telephone Encounter (Signed)
Will forward to Dr. Lorelei Pont to send in Rx for Flovent.

## 2019-01-22 NOTE — Telephone Encounter (Signed)
PA for Asmanex HFA completed on CoverMyMeds and sent for review.  It can take 1 to 5 business days to get a decision.

## 2019-01-23 ENCOUNTER — Encounter: Payer: Self-pay | Admitting: Family Medicine

## 2019-01-24 MED ORDER — BECLOMETHASONE DIPROP HFA 80 MCG/ACT IN AERB: 1.0000 | INHALATION_SPRAY | Freq: Two times a day (BID) | RESPIRATORY_TRACT | 5 refills | Status: DC

## 2019-01-24 NOTE — Telephone Encounter (Signed)
Spoke with [place].  She states she has never tried the Qvar but has done Advair in the past and wanted to know if her insurance covered Advair.  I advised that she would have to call her insurance to find out what medications are on their formulary for asthma.  Patient then states to go ahead and send in Qvar because she has a coupon for $15 a month.  Will forward to Dr. Lorelei Pont to have him send in Rx since it is a new medication.

## 2019-01-24 NOTE — Addendum Note (Signed)
Addended by: Owens Loffler on: 01/24/2019 04:37 PM   Modules accepted: Orders

## 2019-01-24 NOTE — Telephone Encounter (Signed)
Okay to send in Rx for Qvar??

## 2019-01-29 ENCOUNTER — Encounter: Payer: Self-pay | Admitting: Family Medicine

## 2019-01-30 ENCOUNTER — Other Ambulatory Visit: Payer: Self-pay | Admitting: Gastroenterology

## 2019-01-30 DIAGNOSIS — R1310 Dysphagia, unspecified: Secondary | ICD-10-CM

## 2019-01-31 ENCOUNTER — Encounter: Payer: Self-pay | Admitting: Obstetrics and Gynecology

## 2019-02-01 ENCOUNTER — Other Ambulatory Visit: Payer: BC Managed Care – PPO

## 2019-02-07 ENCOUNTER — Encounter: Payer: Self-pay | Admitting: Family Medicine

## 2019-02-08 MED ORDER — FLUTICASONE-SALMETEROL 115-21 MCG/ACT IN AERO: 2.0000 | INHALATION_SPRAY | Freq: Two times a day (BID) | RESPIRATORY_TRACT | 3 refills | Status: DC

## 2019-02-08 MED ORDER — FLUTICASONE-SALMETEROL 115-21 MCG/ACT IN AERO: 2.0000 | INHALATION_SPRAY | Freq: Two times a day (BID) | RESPIRATORY_TRACT | 12 refills | Status: DC

## 2019-02-08 NOTE — Addendum Note (Signed)
Addended by: Owens Loffler on: 02/08/2019 02:56 PM   Modules accepted: Orders

## 2019-02-09 ENCOUNTER — Ambulatory Visit
Admission: RE | Admit: 2019-02-09 | Discharge: 2019-02-09 | Disposition: A | Discharge status: Home / Self Care | Payer: BC Managed Care – PPO | Source: Ambulatory Visit | Attending: Gastroenterology | Admitting: Gastroenterology

## 2019-02-09 DIAGNOSIS — R1310 Dysphagia, unspecified: Secondary | ICD-10-CM

## 2019-02-20 ENCOUNTER — Ambulatory Visit: Payer: BC Managed Care – PPO | Admitting: Allergy & Immunology

## 2019-02-28 ENCOUNTER — Encounter: Payer: Self-pay | Admitting: Family Medicine

## 2019-02-28 MED ORDER — HYDROXYZINE HCL 10 MG PO TABS: 10.0000 mg | ORAL_TABLET | Freq: Three times a day (TID) | ORAL | 2 refills | Status: DC | PRN

## 2019-03-08 ENCOUNTER — Encounter: Payer: Self-pay | Admitting: Family Medicine

## 2019-04-02 ENCOUNTER — Other Ambulatory Visit: Payer: Self-pay | Admitting: Family Medicine

## 2019-04-04 ENCOUNTER — Encounter: Payer: Self-pay | Admitting: Family Medicine

## 2019-04-26 ENCOUNTER — Encounter: Payer: Self-pay | Admitting: Family Medicine

## 2019-04-26 ENCOUNTER — Encounter: Payer: Self-pay | Admitting: Internal Medicine

## 2019-05-16 ENCOUNTER — Ambulatory Visit: Payer: BC Managed Care – PPO | Admitting: Family Medicine

## 2019-05-28 ENCOUNTER — Encounter: Payer: Self-pay | Admitting: Family Medicine

## 2019-05-30 ENCOUNTER — Other Ambulatory Visit: Payer: Self-pay | Admitting: Family Medicine

## 2019-06-12 ENCOUNTER — Encounter: Payer: Self-pay | Admitting: Family Medicine

## 2019-06-12 NOTE — Telephone Encounter (Signed)
Claritin and Flonase are not on our medication list.  Ok to refill?

## 2019-06-13 MED ORDER — LORATADINE 10 MG PO TABS: 10.0000 mg | ORAL_TABLET | Freq: Every day | ORAL | 1 refills | Status: DC

## 2019-06-13 MED ORDER — FLUTICASONE PROPIONATE 50 MCG/ACT NA SUSP: 2.0000 | Freq: Every day | NASAL | 1 refills | Status: DC

## 2019-06-13 NOTE — Telephone Encounter (Signed)
Okay to refill.  Claritin 10 mg 1 p.o. daily, #90, 1 refill.  Flonase, 2 sprays each nostril 1 time daily.  Dispense 3 bottles, 1 refill.

## 2019-06-21 ENCOUNTER — Encounter: Payer: BC Managed Care – PPO | Admitting: Internal Medicine

## 2019-06-27 ENCOUNTER — Encounter: Payer: Self-pay | Admitting: Family Medicine

## 2019-06-27 MED ORDER — ESCITALOPRAM OXALATE 20 MG PO TABS: 10.0000 mg | ORAL_TABLET | Freq: Every day | ORAL | 0 refills | Status: DC

## 2019-07-03 ENCOUNTER — Encounter: Payer: Self-pay | Admitting: Family Medicine

## 2019-07-06 ENCOUNTER — Encounter: Payer: Self-pay | Admitting: Family Medicine

## 2019-07-10 ENCOUNTER — Telehealth: Payer: 59 | Admitting: Family Medicine

## 2019-07-10 ENCOUNTER — Telehealth: Payer: Self-pay

## 2019-07-10 NOTE — Telephone Encounter (Signed)
Pt cancelled appointment. Has appt with PCP on 07/12/2019

## 2019-07-10 NOTE — Telephone Encounter (Signed)
Per appt notes my chart virtual appt scheduled 07/10/19 at 9:40 with Dr Einar Pheasant.

## 2019-07-10 NOTE — Telephone Encounter (Signed)
[facility] - Client TELEPHONE ADVICE RECORD [facility] Patient Name: Gloria Rush Gender: Female DOB: 07/10/1975 Age: 44 Y 3 M 7 D Return Phone Number: [phone] (Primary) Address: City/State/Zip: [place] [address] Client [facility] - Client Client Site [facility] - Day Physician Copland, Frederico Hamman - MD Contact Type Call Who Is Calling Patient / Member / Family / Caregiver Call Type Triage / Clinical Relationship To Patient Self Return Phone Number [phone] (Primary) Chief Complaint CHEST PAIN (>=21 years) - pain, pressure, heaviness or tightness Reason for Call Symptomatic / Request for [place] states she is out of town, has chest discomfort, and thinks it is from her asthma. Translation No Nurse Assessment Nurse: Merry Lofty, RN, Lattie Haw Date/Time (Eastern Time): 07/09/2019 4:15:55 PM Confirm and document reason for call. If symptomatic, describe symptoms. ---Caller states she is having discomfort in her upper chest. She states it feels as if she does when she has bronchitis. States the pain started within the last 24 hours. She describes as more of a discomfort than a pain. She states she was able to cough up a little sputum. She states she is currently in [place] and would like to have a zpack. Denies fever. Denies COVID exposure. Denies asthma symptoms. She states she is being treated with topical medication for a sore on her face. Has the patient had close contact with a person known or suspected to have the novel coronavirus illness OR traveled / lives in area with major community spread (including international travel) in the last 14 days from the onset of symptoms? * If Asymptomatic, screen for exposure and travel within the last 14 days. ---No Does the patient have any new or worsening symptoms? ---Yes Will a triage be completed? ---Yes Related visit to  physician within the last 2 weeks? ---No Does the PT have any chronic conditions? (i.e. diabetes, asthma, this includes High risk factors for pregnancy, etc.) ---Yes List chronic conditions. ---asthma, anxiety - Takes hydroxyzine hs. She does have albuterol inhaler on hand and has been using it for 2 days. Is the patient pregnant or possibly pregnant? (Ask all females between the ages of 86-55) ---No Is this a behavioral health or substance abuse call? ---No PLEASE NOTE: All timestamps contained within this report are represented as [place] Standard Time. CONFIDENTIALTY NOTICE: This fax transmission is intended only for the addressee. It contains information that is legally privileged, confidential or otherwise protected from use or disclosure. If you are not the intended recipient, you are strictly prohibited from reviewing, disclosing, copying using or disseminating any of this information or taking any action in reliance on or regarding this information. If you have received this fax in error, please notify us immediately by telephone so that we can arrange for its return to [place]. Phone: [phone], Toll-Free: [phone], Fax: [phone] Page: 2 of 3 Call Id: [id number] Guidelines Guideline Title Affirmed Question Affirmed Notes Nurse Date/Time Eilene Ghazi Time) Asthma Attack [1] MILD asthma attack (e.g., no SOB at rest, mild SOB with walking, speaks normally in sentences, mild wheezing) AND [2] persists > 24 hours on appropriate treatment Merry Lofty, RNLattie Haw 07/09/2019 4:19:52 PM Disp. Time Eilene Ghazi Time) Disposition Final User 07/09/2019 4:10:01 PM Send to Urgent Ezra Sites 07/09/2019 4:24:57 PM See PCP within 24 Hours Yes Guinn, RN, Leland Johns Disagree/Comply Comply Caller Understands Yes PreDisposition Did not know what to do Care Advice Given Per Guideline SEE PCP WITHIN 24  HOURS: * IF OFFICE WILL BE OPEN: You need to be examined within the next 24 hours. Call your  doctor (or NP/PA) when the office opens and make an appointment. ASTHMA QUICK-RELIEF MEDICINE: * Start your quick-relief medicine (e.g., albuterol, salbutamol) at the first sign of any coughing or shortness of breath (don't wait for wheezing). * Use inhaler (2 puffs each time) or nebulizer every 4 hours. DRINKING FLUIDS AND USING A HUMIDIFIER: * Drink a normal amount of liquids (e.g., water). Being adequately hydrated makes it easier to cough up the sticky lung mucus. CARE ADVICE given per Asthma Attack (Adult) guideline. * Wheezing is not improved after neb or inhaler CALL BACK IF: * Inhaled asthma medicine (neb or MDI) is needed more often than every 4 hours * Wheezing is not completely cleared by 5 days * You become worse. Comments User: Claudius Sis, RN Date/Time Eilene Ghazi Time): 07/09/2019 4:21:04 PM oxygen sat - 99 User: Claudius Sis, RN Date/Time Eilene Ghazi Time): 07/09/2019 4:28:06 PM Caller is requesting either TeleDoc Visit with the office or Rx if they are willing to send in antibiotic. She states she feels like she needs a zpack. If her office does not have Teledoc options she will use TeleDoc service through her insurance Company. User: Claudius Sis, RN Date/Time Eilene Ghazi Time): 07/09/2019 4:29:21 PM Office back line phone is busy User: Claudius Sis, RN Date/Time Eilene Ghazi Time): 07/09/2019 4:33:07 PM Denies chest pain User: Claudius Sis, RN Date/Time Eilene Ghazi Time): 07/09/2019 4:33:26 PM No answer office back line User: Claudius Sis, RN Date/Time Eilene Ghazi Time): 07/09/2019 4:36:07 PM Spoke with office regarding tele health appointment tomorrow at 9:40 a.m Office will call her to confirm the appointment. PLEASE NOTE: All timestamps contained within this report are represented as [place] Standard Time. CONFIDENTIALTY NOTICE: This fax transmission is intended only for the addressee. It contains information that is legally privileged, confidential or otherwise protected from use or disclosure. If  you are not the intended recipient, you are strictly prohibited from reviewing, disclosing, copying using or disseminating any of this information or taking any action in reliance on or regarding this information. If you have received this fax in error, please notify us immediately by telephone so that we can arrange for its return to [place]. Phone: [phone], Toll-Free: [phone], Fax: [phone] Page: 3 of 3 Call Id: [id number] Referrals REFERRED TO PCP OFFICE

## 2019-07-10 NOTE — Telephone Encounter (Signed)
Thank-you. Very appropriate, and I have known > 10 years.

## 2019-07-12 ENCOUNTER — Encounter: Payer: Self-pay | Admitting: Family Medicine

## 2019-07-12 ENCOUNTER — Ambulatory Visit (INDEPENDENT_AMBULATORY_CARE_PROVIDER_SITE_OTHER): Payer: 59 | Admitting: Family Medicine

## 2019-07-12 ENCOUNTER — Other Ambulatory Visit: Payer: Self-pay

## 2019-07-12 VITALS — BP 108/70 | HR 78 | Temp 98.6°F | Ht 65.0 in | Wt 126.5 lb

## 2019-07-12 DIAGNOSIS — D500 Iron deficiency anemia secondary to blood loss (chronic): Secondary | ICD-10-CM

## 2019-07-12 DIAGNOSIS — J45901 Unspecified asthma with (acute) exacerbation: Secondary | ICD-10-CM | POA: Diagnosis not present

## 2019-07-12 DIAGNOSIS — Z13220 Encounter for screening for lipoid disorders: Secondary | ICD-10-CM

## 2019-07-12 DIAGNOSIS — Z79899 Other long term (current) drug therapy: Secondary | ICD-10-CM

## 2019-07-12 DIAGNOSIS — E0590 Thyrotoxicosis, unspecified without thyrotoxic crisis or storm: Secondary | ICD-10-CM | POA: Diagnosis not present

## 2019-07-12 DIAGNOSIS — Z1509 Genetic susceptibility to other malignant neoplasm: Secondary | ICD-10-CM

## 2019-07-12 DIAGNOSIS — Z1507 Genetic susceptibility to malignant neoplasm of urinary tract: Secondary | ICD-10-CM

## 2019-07-12 DIAGNOSIS — B349 Viral infection, unspecified: Secondary | ICD-10-CM | POA: Diagnosis not present

## 2019-07-12 LAB — CBC WITH DIFFERENTIAL/PLATELET
Basophils Absolute: 0.1 10*3/uL (ref 0.0–0.1)
Basophils Relative: 1 % (ref 0.0–3.0)
Eosinophils Absolute: 0 10*3/uL (ref 0.0–0.7)
Eosinophils Relative: 0.2 % (ref 0.0–5.0)
HCT: 30.9 % — ABNORMAL LOW (ref 36.0–46.0)
Hemoglobin: 9.5 g/dL — ABNORMAL LOW (ref 12.0–15.0)
Lymphocytes Relative: 15.6 % (ref 12.0–46.0)
Lymphs Abs: 1.2 10*3/uL (ref 0.7–4.0)
MCHC: 30.8 g/dL (ref 30.0–36.0)
MCV: 81.1 fl (ref 78.0–100.0)
Monocytes Absolute: 0.4 10*3/uL (ref 0.1–1.0)
Monocytes Relative: 5.5 % (ref 3.0–12.0)
Neutro Abs: 6.2 10*3/uL (ref 1.4–7.7)
Neutrophils Relative %: 77.7 % — ABNORMAL HIGH (ref 43.0–77.0)
Platelets: 190 10*3/uL (ref 150.0–400.0)
RBC: 3.81 Mil/uL — ABNORMAL LOW (ref 3.87–5.11)
RDW: 16.1 % — ABNORMAL HIGH (ref 11.5–15.5)
WBC: 8 10*3/uL (ref 4.0–10.5)

## 2019-07-12 LAB — BASIC METABOLIC PANEL
BUN: 9 mg/dL (ref 6–23)
CO2: 28 mEq/L (ref 19–32)
Calcium: 9.1 mg/dL (ref 8.4–10.5)
Chloride: 104 mEq/L (ref 96–112)
Creatinine, Ser: 0.71 mg/dL (ref 0.40–1.20)
GFR: 90.15 mL/min (ref >60.00)
Glucose, Bld: 90 mg/dL (ref 70–99)
Potassium: 3.8 mEq/L (ref 3.5–5.1)
Sodium: 140 mEq/L (ref 135–145)

## 2019-07-12 LAB — LIPID PANEL
Cholesterol: 184 mg/dL (ref 0–200)
HDL: 68.9 mg/dL (ref >39.00)
LDL Cholesterol: 103 mg/dL — ABNORMAL HIGH (ref 0–99)
NonHDL: 114.89
Total CHOL/HDL Ratio: 3
Triglycerides: 59 mg/dL (ref 0.0–149.0)
VLDL: 11.8 mg/dL (ref 0.0–40.0)

## 2019-07-12 LAB — HEPATIC FUNCTION PANEL
ALT: 8 U/L (ref 0–35)
AST: 11 U/L (ref 0–37)
Albumin: 4.2 g/dL (ref 3.5–5.2)
Alkaline Phosphatase: 76 U/L (ref 39–117)
Bilirubin, Direct: 0.1 mg/dL (ref 0.0–0.3)
Total Bilirubin: 0.4 mg/dL (ref 0.2–1.2)
Total Protein: 6.7 g/dL (ref 6.0–8.3)

## 2019-07-12 LAB — T4, FREE: Free T4: 0.55 ng/dL — ABNORMAL LOW (ref 0.60–1.60)

## 2019-07-12 LAB — IBC + FERRITIN
Ferritin: 2.5 ng/mL — ABNORMAL LOW (ref 10.0–291.0)
Iron: 17 ug/dL — ABNORMAL LOW (ref 42–145)
Saturation Ratios: 4.1 % — ABNORMAL LOW (ref 20.0–50.0)
Transferrin: 298 mg/dL (ref 212.0–360.0)

## 2019-07-12 LAB — T3, FREE: T3, Free: 3.4 pg/mL (ref 2.3–4.2)

## 2019-07-12 LAB — TSH: TSH: 1.08 u[IU]/mL (ref 0.35–4.50)

## 2019-07-12 MED ORDER — AZITHROMYCIN 250 MG PO TABS: ORAL_TABLET | ORAL | 0 refills | Status: DC

## 2019-07-12 NOTE — Progress Notes (Signed)
Armeda Plumb T. Ailine Hefferan, MD, [facility] at [facility] [place], [address]  Phone: [phone]  FAX: [address] - 44 y.o. female  MRN [id number]  Date of Birth: 27-Jun-1975  Date: 07/12/2019  PCP: Owens Loffler, MD  Referral: Owens Loffler, MD  Chief Complaint  Patient presents with  . Upper Chest Congestion  . Sore on Face    This visit occurred during the SARS-CoV-2 public health emergency.  Safety protocols were in place, including screening questions prior to the visit, additional usage of staff PPE, and extensive cleaning of exam room while observing appropriate contact time as indicated for disinfecting solutions.   Subjective:   Gloria Rush is a 44 y.o. very pleasant female patient with Body mass index is 21.05 kg/m. who presents with the following:  She is a well-known patient with some asthma and she presents with some upper chest congestion as well as a sore on her face.  Has been there on her face for about.  She has a bump on the left side of her nares, and this was worse, but this is improved while she has been using some Bactroban.  On Monday, felt like something was off and tight in her chest.  Did a teledoc.  Got some pred in the morning - 10 big for 5 days.  She is still taking some prednisone now - Had a 99.8 F with her temp.   Using albuterol 2-3 times a day.  This is gotten quite a bit worse compared to before she started taking her prednisone.  She also needs to get all of her laboratories including her thyroid studies.  She has seen recently endocrinology  Review of Systems is noted in the HPI, as appropriate  Objective:   BP 108/70   Pulse 78   Temp 98.6 F (37 C) (Temporal)   Ht 5\' 5"  (1.651 m)   Wt 126 lb 8 oz (57.4 kg)   LMP 06/25/2019   SpO2 97%   BMI 21.05 kg/m    GEN: WDWN, NAD, Non-toxic HEENT: Atraumatic,  Normocephalic. Neck supple. No masses.  On the left nares there is evidence of some slight enlargement with some excoriation. CV: RRR, No M/G/R. No JVD. No thrill. No extra heart sounds. PULM: CTA B, no wheezes, crackles, rhonchi. No retractions. No resp. distress. No accessory muscle use. EXTR: No c/c/e NEURO Normal gait.  PSYCH: Normally interactive. Conversant.     Laboratory and Imaging Data:  Assessment and Plan:     ICD-10-CM   1. Viral syndrome  B34.9   2. Mild asthma exacerbation  J45.901   3. Hyperthyroidism  E05.90 TSH    T3, free    T4, free  4. Screening, lipid  Z13.220 Lipid panel  5. Encounter for long-term (current) use of medications  U13.244 Basic metabolic panel    Hepatic function panel    CBC with Differential/Platelet  6. Lynch syndrome  Z15.09 CA 125  7. Iron deficiency anemia due to chronic blood loss  D50.0 IBC + Ferritin   Mild asthma attack currently on prednisone.  Sinus pressure and pain.  I think that this is all viral, but I did give her a paper prescription for Z-Pak given the long weekend.  Compliant reliable patient.  Obtain all baseline laboratories with follow-up for physical.  Follow-up: No follow-ups on file.  Meds ordered this encounter  Medications  .  azithromycin (ZITHROMAX) 250 MG tablet    Sig: 2 tabs po on day 1, then 1 tab po for 4 days    Dispense:  6 tablet    Refill:  0   Medications Discontinued During This Encounter  Medication Reason  . beclomethasone (QVAR) 80 MCG/ACT inhaler Completed Course  . esomeprazole (NEXIUM) 40 MG capsule Completed Course  . fluticasone-salmeterol (ADVAIR HFA) 115-21 MCG/ACT inhaler Completed Course  . fluticasone (FLOVENT HFA) 220 MCG/ACT inhaler Completed Course  . Norethindrone-Ethinyl Estradiol-Fe Biphas (LO LOESTRIN FE) 1 MG-10 MCG / 10 MCG tablet Completed Course  . diphenhydrAMINE (BENADRYL) 25 MG tablet    Orders Placed This Encounter  Procedures  . Lipid panel  . Basic metabolic  panel  . Hepatic function panel  . TSH  . T3, free  . T4, free  . CBC with Differential/Platelet  . CA 125  . IBC + Ferritin    Signed,  Athen Riel T. Junious Ragone, MD   Outpatient Encounter Medications as of 07/12/2019  Medication Sig  . albuterol (PROVENTIL) (2.5 MG/3ML) 0.083% nebulizer solution Take 3 mLs (2.5 mg total) by nebulization every 6 (six) hours as needed for wheezing or shortness of breath.  Marland Kitchen albuterol (VENTOLIN HFA) 108 (90 Base) MCG/ACT inhaler USE 2 PUFFS EVERY 4 HOURS AS NEEDED FOR WHEEZING  . escitalopram (LEXAPRO) 20 MG tablet Take 0.5 tablets (10 mg total) by mouth daily.  . fluticasone (FLONASE) 50 MCG/ACT nasal spray Place 2 sprays into both nostrils daily.  . hydrOXYzine (ATARAX/VISTARIL) 10 MG tablet TAKE 1 TABLET (10 MG TOTAL) BY MOUTH 3 (THREE) TIMES DAILY AS NEEDED FOR ANXIETY.  Marland Kitchen loratadine (CLARITIN) 10 MG tablet Take 1 tablet (10 mg total) by mouth daily.  . mupirocin ointment (BACTROBAN) 2 % SMARTSIG:1 Application Topical 2-3 Times Daily  . nystatin (MYCOSTATIN) 100000 UNIT/ML suspension Take 5 mLs (500,000 Units total) by mouth 4 (four) times daily.  . predniSONE (DELTASONE) 10 MG tablet Take 10 mg by mouth 2 (two) times daily.  Marland Kitchen propylthiouracil (PTU) 50 MG tablet Take by mouth.  . [DISCONTINUED] diphenhydrAMINE (BENADRYL) 25 MG tablet Take 25 mg by mouth every 6 (six) hours as needed.  Marland Kitchen azithromycin (ZITHROMAX) 250 MG tablet 2 tabs po on day 1, then 1 tab po for 4 days  . [DISCONTINUED] beclomethasone (QVAR) 80 MCG/ACT inhaler Inhale 1 puff into the lungs 2 (two) times daily.  . [DISCONTINUED] esomeprazole (NEXIUM) 40 MG capsule Take 1 capsule (40 mg total) by mouth daily.  . [DISCONTINUED] fluticasone (FLOVENT HFA) 220 MCG/ACT inhaler Inhale 1 puff into the lungs 2 (two) times daily.  . [DISCONTINUED] fluticasone-salmeterol (ADVAIR HFA) 115-21 MCG/ACT inhaler Inhale 2 puffs into the lungs 2 (two) times daily.  . [DISCONTINUED] Norethindrone-Ethinyl  Estradiol-Fe Biphas (LO LOESTRIN FE) 1 MG-10 MCG / 10 MCG tablet Take 1 tablet by mouth daily.   Facility-Administered Encounter Medications as of 07/12/2019  Medication  . 0.9 %  sodium chloride infusion

## 2019-07-13 LAB — CA 125: CA 125: 10 U/mL (ref <35)

## 2019-07-27 ENCOUNTER — Other Ambulatory Visit: Payer: BC Managed Care – PPO

## 2019-08-01 ENCOUNTER — Encounter: Payer: BC Managed Care – PPO | Admitting: Family Medicine

## 2019-08-02 ENCOUNTER — Encounter: Payer: Self-pay | Admitting: Family Medicine

## 2019-08-09 ENCOUNTER — Other Ambulatory Visit: Payer: BC Managed Care – PPO

## 2019-08-15 ENCOUNTER — Other Ambulatory Visit: Payer: Self-pay

## 2019-08-15 ENCOUNTER — Ambulatory Visit (INDEPENDENT_AMBULATORY_CARE_PROVIDER_SITE_OTHER): Payer: 59 | Admitting: Family Medicine

## 2019-08-15 ENCOUNTER — Telehealth: Payer: Self-pay

## 2019-08-15 ENCOUNTER — Encounter: Payer: Self-pay | Admitting: Family Medicine

## 2019-08-15 VITALS — BP 90/60 | HR 82 | Temp 98.5°F | Ht 64.5 in | Wt 130.8 lb

## 2019-08-15 DIAGNOSIS — D500 Iron deficiency anemia secondary to blood loss (chronic): Secondary | ICD-10-CM | POA: Diagnosis not present

## 2019-08-15 DIAGNOSIS — Z0000 Encounter for general adult medical examination without abnormal findings: Secondary | ICD-10-CM | POA: Diagnosis not present

## 2019-08-15 NOTE — Progress Notes (Signed)
Hiren Peplinski T. Emanuella Nickle, MD, [place] at [facility] [place], [address]  Phone: [phone]  FAX: [place] - 44 y.o. female  MRN [id number]  Date of Birth: 12-03-1975  Date: 08/15/2019  PCP: Owens Loffler, MD  Referral: Owens Loffler, MD  Chief Complaint  Patient presents with  . Annual Exam    This visit occurred during the SARS-CoV-2 public health emergency.  Safety protocols were in place, including screening questions prior to the visit, additional usage of staff PPE, and extensive cleaning of exam room while observing appropriate contact time as indicated for disinfecting solutions.   Patient Care Team: Owens Loffler, MD as PCP - General Subjective:   Gloria Rush is a 44 y.o. pleasant patient who presents with the following:  Health Maintenance Summary Reviewed and updated, unless pt declines services.  Tobacco History Reviewed. Non-smoker Alcohol: No concerns, no excessive use Exercise Habits: Some activity, rec at least 30 mins 5 times a week STD concerns: none Drug Use: None Lumps or breast concerns: no  Covid vaccine -hesitant at this point, but she is considering this.  Asthma -doing a lot better recently.  Anxiety - some hydroxizine, overall she does think that her anxiety is improved continued on Lexapro with some occasional hydroxyzine for anxiety.  Changed PTU, endocrinology.    Health Maintenance  Topic Date Due  . Hepatitis C Screening  Never done  . COLONOSCOPY  03/30/2017  . INFLUENZA VACCINE  08/12/2019  . PAP SMEAR-Modifier  02/05/2020  . TETANUS/TDAP  10/10/2021  . HIV Screening  Completed    Immunization History  Administered Date(s) Administered  . Influenza Split 12/24/2010  . Influenza Whole 12/27/2006, 10/10/2007, 11/19/2009  . Influenza, Quadrivalent, Recombinant, Inj, Pf 11/19/2015, 11/26/2016  .  Influenza,inj,Quad PF,6+ Mos 09/29/2018  . Influenza,trivalent, recombinat, inj, PF 12/21/2013  . Influenza-Unspecified 11/25/2012, 01/29/2015  . PPD Test 07/14/2010  . Tdap 10/11/2011   Patient Active Problem List   Diagnosis Date Noted  . Lynch syndrome 04/13/2013    Priority: High  . Major depressive disorder, recurrent, in full remission ([place]) 12/18/2009    Priority: Medium  . Panic disorder without agoraphobia with panic attacks in partial remission 01/23/2008    Priority: Medium  . Asthma, moderate persistent, well-controlled 12/27/2006    Priority: Medium  . Increased risk of breast cancer 10/31/2018  . Menorrhagia with regular cycle 06/11/2016  . Family history of breast cancer 06/11/2016  . Unspecified vitamin D deficiency 08/24/2012  . ALLERGIC CONJUNCTIVITIS 12/23/2008  . DERMATITIS, ATOPIC 12/23/2008  . GERD 12/05/2007  . Allergic rhinitis 12/27/2006    Past Medical History:  Diagnosis Date  . Allergy   . Anxiety   . Asthma   . Family history of breast cancer   . Fibroid   . Genetic testing of female    MyRisk Positive  . Increased risk of breast cancer 2014, 2018   riskscore=22.2% 6/18  . Infertility, female   . Lynch syndrome    PMS2 pos 11/14, ATM VUS  . Panic disorder     Past Surgical History:  Procedure Laterality Date  . WISDOM TOOTH EXTRACTION      Family History  Problem Relation Age of Onset  . Anxiety disorder Mother   . Anxiety disorder Brother   . Diabetes Maternal Grandfather   . Breast cancer Maternal Grandmother 70  . Breast cancer Other 59  .  Colon cancer Neg Hx   . Stomach cancer Neg Hx     Past Medical History, Surgical History, Social History, Family History, Problem List, Medications, and Allergies have been reviewed and updated if relevant.  Review of Systems: Pertinent positives are listed above.  Otherwise, a full 14 point review of systems has been done in full and it is negative except where it is noted  positive.  Objective:   BP 90/60   Pulse 82   Temp 98.5 F (36.9 C) (Temporal)   Ht 5' 4.5" (1.638 m)   Wt 130 lb 12 oz (59.3 kg)   LMP 07/27/2019   SpO2 98%   BMI 22.10 kg/m  Ideal Body Weight: Weight in (lb) to have BMI = 25: 147.6 No exam data present Depression screen [facility] 2/9 08/07/2018  Decreased Interest 0  Down, Depressed, Hopeless 0  PHQ - 2 Score 0     GEN: well developed, well nourished, no acute distress Eyes: conjunctiva and lids normal, PERRLA, EOMI ENT: TM clear, nares clear, oral exam WNL Neck: supple, no lymphadenopathy, no thyromegaly, no JVD Pulm: clear to auscultation and percussion, respiratory effort normal CV: regular rate and rhythm, S1-S2, no murmur, rub or gallop, no bruits Chest: no scars, masses, no lumps BREAST: breast exam declined GI: soft, non-tender; no hepatosplenomegaly, masses; active bowel sounds all quadrants GU: GU exam declined Lymph: no cervical, axillary or inguinal adenopathy MSK: gait normal, muscle tone and strength WNL, no joint swelling, effusions, discoloration, crepitus  SKIN: clear, good turgor, color WNL, no rashes, lesions, or ulcerations Neuro: normal mental status, normal strength, sensation, and motion Psych: alert; oriented to person, place and time, normally interactive and not anxious or depressed in appearance.   All labs reviewed with patient. Results for orders placed or performed in visit on 07/12/19  Lipid panel  Result Value Ref Range   Cholesterol 184 0 - 200 mg/dL   Triglycerides 59.0 0 - 149 mg/dL   HDL 68.90 >39.00 mg/dL   VLDL 11.8 0.0 - 40.0 mg/dL   LDL Cholesterol 103 (H) 0 - 99 mg/dL   Total CHOL/HDL Ratio 3    NonHDL 703.50   Basic metabolic panel  Result Value Ref Range   Sodium 140 135 - 145 mEq/L   Potassium 3.8 3.5 - 5.1 mEq/L   Chloride 104 96 - 112 mEq/L   CO2 28 19 - 32 mEq/L   Glucose, Bld 90 70 - 99 mg/dL   BUN 9 6 - 23 mg/dL   Creatinine, Ser 0.71 0.40 - 1.20 mg/dL   GFR 90.15  >60.00 mL/min   Calcium 9.1 8.4 - 10.5 mg/dL  Hepatic function panel  Result Value Ref Range   Total Bilirubin 0.4 0.2 - 1.2 mg/dL   Bilirubin, Direct 0.1 0.0 - 0.3 mg/dL   Alkaline Phosphatase 76 39 - 117 U/L   AST 11 0 - 37 U/L   ALT 8 0 - 35 U/L   Total Protein 6.7 6.0 - 8.3 g/dL   Albumin 4.2 3.5 - 5.2 g/dL  TSH  Result Value Ref Range   TSH 1.08 0.35 - 4.50 uIU/mL  T3, free  Result Value Ref Range   T3, Free 3.4 2.3 - 4.2 pg/mL  T4, free  Result Value Ref Range   Free T4 0.55 (L) 0.60 - 1.60 ng/dL  CBC with Differential/Platelet  Result Value Ref Range   WBC 8.0 4.0 - 10.5 K/uL   RBC 3.81 (L) 3.87 - 5.11 Mil/uL  Hemoglobin 9.5 (L) 12.0 - 15.0 g/dL   HCT 30.9 (L) 36 - 46 %   MCV 81.1 78.0 - 100.0 fl   MCHC 30.8 30.0 - 36.0 g/dL   RDW 16.1 (H) 11.5 - 15.5 %   Platelets 190.0 150 - 400 K/uL   Neutrophils Relative % 77.7 (H) 43 - 77 %   Lymphocytes Relative 15.6 12 - 46 %   Monocytes Relative 5.5 3 - 12 %   Eosinophils Relative 0.2 0 - 5 %   Basophils Relative 1.0 0 - 3 %   Neutro Abs 6.2 1.4 - 7.7 K/uL   Lymphs Abs 1.2 0.7 - 4.0 K/uL   Monocytes Absolute 0.4 0 - 1 K/uL   Eosinophils Absolute 0.0 0 - 0 K/uL   Basophils Absolute 0.1 0 - 0 K/uL  CA 125  Result Value Ref Range   CA 125 10 <35 U/mL  IBC + Ferritin  Result Value Ref Range   Iron 17 (L) 42 - 145 ug/dL   Transferrin 298.0 212.0 - 360.0 mg/dL   Saturation Ratios 4.1 (L) 20.0 - 50.0 %   Ferritin 2.5 (L) 10.0 - 291.0 ng/mL   No results found.  Assessment and Plan:     ICD-10-CM   1. Healthcare maintenance  Z00.00   2. Blood loss anemia  D50.0 CBC with Differential/Platelet    IBC + Ferritin   Her blood count as well as her ferritin are quite low.  Consistent with blood loss anemia.  Heavy periods.  She also does have Lynch syndrome, so I recommended strongly that the patient have follow-up colonoscopy and she is going to do this within the next few months.  Health Maintenance Exam: The patient's  preventative maintenance and recommended screening tests for an annual wellness exam were reviewed in full today. Brought up to date unless services declined.  Counselled on the importance of diet, exercise, and its role in overall health and mortality. The patient's FH and SH was reviewed, including their home life, tobacco status, and drug and alcohol status.  Follow-up in 1 year for physical exam or additional follow-up below.  Follow-up: Return in about 2 months (around 10/15/2019) for lab check only. Or follow-up in 1 year if not noted.  No future appointments.  No orders of the defined types were placed in this encounter.  Medications Discontinued During This Encounter  Medication Reason  . azithromycin (ZITHROMAX) 250 MG tablet Completed Course  . predniSONE (DELTASONE) 10 MG tablet Completed Course   Orders Placed This Encounter  Procedures  . CBC with Differential/Platelet  . IBC + Ferritin    Signed,  Chundra Sauerwein T. Ellina Sivertsen, MD   Allergies as of 08/15/2019      Reactions   Amoxicillin    Itching (once), no rash Rapid heart rate   Chicken Meat (diagnostic) Other (See Comments)   Throat feels tight and GERD   Dexilant [dexlansoprazole] Other (See Comments)   Throat feels tight      Medication List       Accurate as of August 15, 2019  2:51 PM. If you have any questions, ask your nurse or doctor.        STOP taking these medications   azithromycin 250 MG tablet Commonly known as: ZITHROMAX Stopped by: Owens Loffler, MD   predniSONE 10 MG tablet Commonly known as: DELTASONE Stopped by: Owens Loffler, MD     TAKE these medications   albuterol (2.5 MG/3ML) 0.083% nebulizer solution Commonly known as:  PROVENTIL Take 3 mLs (2.5 mg total) by nebulization every 6 (six) hours as needed for wheezing or shortness of breath.   albuterol 108 (90 Base) MCG/ACT inhaler Commonly known as: Ventolin HFA USE 2 PUFFS EVERY 4 HOURS AS NEEDED FOR WHEEZING    escitalopram 20 MG tablet Commonly known as: LEXAPRO Take 0.5 tablets (10 mg total) by mouth daily.   fluticasone 50 MCG/ACT nasal spray Commonly known as: FLONASE Place 2 sprays into both nostrils daily.   hydrOXYzine 10 MG tablet Commonly known as: ATARAX/VISTARIL TAKE 1 TABLET (10 MG TOTAL) BY MOUTH 3 (THREE) TIMES DAILY AS NEEDED FOR ANXIETY.   loratadine 10 MG tablet Commonly known as: Claritin Take 1 tablet (10 mg total) by mouth daily.   mupirocin ointment 2 % Commonly known as: BACTROBAN SMARTSIG:1 Application Topical 2-3 Times Daily   nystatin 100000 UNIT/ML suspension Commonly known as: MYCOSTATIN Take 5 mLs (500,000 Units total) by mouth 4 (four) times daily.   propylthiouracil 50 MG tablet Commonly known as: PTU Take by mouth.

## 2019-08-15 NOTE — Telephone Encounter (Signed)
Dawn from [facility] calling for copy of pt's last TDAP.  F# [phone]

## 2019-08-20 ENCOUNTER — Encounter: Payer: Self-pay | Admitting: Family Medicine

## 2019-08-28 ENCOUNTER — Other Ambulatory Visit: Payer: Self-pay | Admitting: Family Medicine

## 2019-09-24 ENCOUNTER — Encounter: Payer: Self-pay | Admitting: Family Medicine

## 2019-09-25 MED ORDER — ALBUTEROL SULFATE HFA 108 (90 BASE) MCG/ACT IN AERS: INHALATION_SPRAY | RESPIRATORY_TRACT | 2 refills | Status: DC

## 2019-09-25 MED ORDER — ESCITALOPRAM OXALATE 20 MG PO TABS: 10.0000 mg | ORAL_TABLET | Freq: Every day | ORAL | 1 refills | Status: DC

## 2019-09-28 ENCOUNTER — Encounter: Payer: Self-pay | Admitting: Family Medicine

## 2019-09-30 MED ORDER — PREDNISONE 20 MG PO TABS: 20.0000 mg | ORAL_TABLET | Freq: Every day | ORAL | 0 refills | Status: DC

## 2019-10-30 ENCOUNTER — Encounter: Payer: Self-pay | Admitting: Family Medicine

## 2019-11-23 ENCOUNTER — Other Ambulatory Visit: Payer: Self-pay | Admitting: Family Medicine

## 2019-11-29 ENCOUNTER — Encounter: Payer: Self-pay | Admitting: Family Medicine

## 2019-12-03 ENCOUNTER — Encounter: Payer: Self-pay | Admitting: Obstetrics and Gynecology

## 2019-12-03 DIAGNOSIS — N920 Excessive and frequent menstruation with regular cycle: Secondary | ICD-10-CM

## 2019-12-03 DIAGNOSIS — D500 Iron deficiency anemia secondary to blood loss (chronic): Secondary | ICD-10-CM

## 2019-12-11 ENCOUNTER — Encounter: Payer: Self-pay | Admitting: Obstetrics and Gynecology

## 2019-12-11 ENCOUNTER — Ambulatory Visit (INDEPENDENT_AMBULATORY_CARE_PROVIDER_SITE_OTHER): Payer: 59 | Admitting: Obstetrics and Gynecology

## 2019-12-11 ENCOUNTER — Other Ambulatory Visit (HOSPITAL_COMMUNITY)
Admission: RE | Admit: 2019-12-11 | Discharge: 2019-12-11 | Disposition: A | Discharge status: Home / Self Care | Payer: 59 | Source: Ambulatory Visit | Attending: Obstetrics and Gynecology | Admitting: Obstetrics and Gynecology

## 2019-12-11 ENCOUNTER — Other Ambulatory Visit: Payer: Self-pay

## 2019-12-11 VITALS — BP 110/70 | Ht 65.0 in | Wt 135.0 lb

## 2019-12-11 DIAGNOSIS — Z803 Family history of malignant neoplasm of breast: Secondary | ICD-10-CM

## 2019-12-11 DIAGNOSIS — Z1211 Encounter for screening for malignant neoplasm of colon: Secondary | ICD-10-CM

## 2019-12-11 DIAGNOSIS — Z9189 Other specified personal risk factors, not elsewhere classified: Secondary | ICD-10-CM

## 2019-12-11 DIAGNOSIS — N939 Abnormal uterine and vaginal bleeding, unspecified: Secondary | ICD-10-CM

## 2019-12-11 DIAGNOSIS — Z1231 Encounter for screening mammogram for malignant neoplasm of breast: Secondary | ICD-10-CM | POA: Diagnosis not present

## 2019-12-11 DIAGNOSIS — Z01419 Encounter for gynecological examination (general) (routine) without abnormal findings: Secondary | ICD-10-CM | POA: Diagnosis not present

## 2019-12-11 DIAGNOSIS — Z1509 Genetic susceptibility to other malignant neoplasm: Secondary | ICD-10-CM | POA: Diagnosis present

## 2019-12-11 DIAGNOSIS — Z124 Encounter for screening for malignant neoplasm of cervix: Secondary | ICD-10-CM | POA: Insufficient documentation

## 2019-12-11 DIAGNOSIS — Z1507 Genetic susceptibility to malignant neoplasm of urinary tract: Secondary | ICD-10-CM

## 2019-12-11 DIAGNOSIS — Z1151 Encounter for screening for human papillomavirus (HPV): Secondary | ICD-10-CM | POA: Insufficient documentation

## 2019-12-11 NOTE — Patient Instructions (Addendum)
I value your feedback and entrusting us with your care. If you get a [facility] patient survey, I would appreciate you taking the time to let us know about your experience today. Thank you! ° °As of December 21, 2018, your lab results will be released to your MyChart immediately, before I even have a chance to see them. Please give me time to review them and contact you if there are any abnormalities. Thank you for your patience.  ° °[facility]: [phone] ° ° ° °

## 2019-12-11 NOTE — Progress Notes (Signed)
Chief Complaint  Patient presents with  . Gynecologic Exam     HPI:      [name] is a 44 y.o. G0P0000 who LMP was Patient's last menstrual period was 12/03/2019 (exact date)., presents today for her annual examination.  Her menses are regular every 28-30 days, lasting 6-7 days, alternating heavy to light flow QO month now. Heavy days are improved now, changing pads BID. Has smaller clots, mild dysmen improved with tylenol. Heavy days last yr were changing pads Q1 hr with clots. Wallie Renshaw has mid cycle light spotting; had mid cycle period-like bleeding this month which is unusual for pt.  Hx of Anemia with low ferritin levels on 7/20 labs; H/H is 9.5/30.9 on 7/21 labs. Taking OTC iron supp. Tried lysteda for menorrhagia in past with good sx relief, but pt has asthma and caused chest tightness for her. Have tried a couple different OCPs for cycle control (and ovar cancer prevention) but increases pt's anxiety.  Hx of 4 cm leio. Also with hx of ovar cyst in past, resolved on 10/20 GYN u/s.   Hx of thyroid disorder, controlled now. Feels menses are improved with this. Was seeing endocrine. Anxiety improved with thyroid control.   Sex activity: single partner, contraception - none. Hx of infertility.  Last Pap: February 05, 2015  Results were: no abnormalities /neg HPV DNA   Last mammogram: 09/08/18  Results were: normal--routine follow-up in 12 months.  There is a FH of breast cancer in her MGM and mat aunt. There is no FH of ovarian cancer. The patient does do self-breast exams. The patient is BRCA neg but Lynch positive (PMS2). IBIS=20.1%  2014. Has not had screening breast MRI. No FH Lynch syndrome cancers.   Tobacco use: The patient denies current or previous tobacco use. Alcohol use: none  No drug use.  Exercise: mod active  She does get adequate calcium and Vitamin D in her diet. Labs with PCP.  She is Lynch pos (PMS2).   Per 2021 NCCN guidelines,   Endometrial cancer:   Yearly EMB (neg 10/20); done today.  Yearly u/s (done 10/20); sched for this wk  Hyst recommended. Pt aware and not interested currently.   Ovarian cancer: Recommend BSO age 10 or after childbearing; pt not interested currently.    Suggest yearly GYN u/s (done 10/20) and ca-125 (neg 7/21)  OCPs if pt doesn't want BSO--tried last yr but can't tolerate side effects of increased anxiety.  Small bowel/Colon cancer: Upper GI Q3-5 yrs, Colonoscopy Q1-2 yrs. Pt had done with Dr. Hilarie Fredrickson 3/18 and wants ref for f/u. Her mom's colonoscopy was neg recently  Hepatobiliary cancers: no med mgmt guidelines  Pancreatic cancers:no FH; no screening options available   Past Medical History:  Diagnosis Date  . Allergy   . Anxiety   . Asthma   . Family history of breast cancer   . Fibroid   . Genetic testing of female    MyRisk Positive  . Increased risk of breast cancer 2014, 2018   riskscore=22.2% 6/18  . Infertility, female   . Lynch syndrome    PMS2 pos 11/14, ATM VUS  . Panic disorder     Past Surgical History:  Procedure Laterality Date  . WISDOM TOOTH EXTRACTION      Family History  Problem Relation Age of Onset  . Anxiety disorder Mother   . Anxiety disorder Brother   . Diabetes Maternal Grandfather   . Breast cancer Maternal Grandmother 77  .  Breast cancer Other 52  . Colon cancer Neg Hx   . Stomach cancer Neg Hx     Social History   Socioeconomic History  . Marital status: Married    Spouse name: Not on file  . Number of children: Not on file  . Years of education: Not on file  . Highest education level: Not on file  Occupational History  . Not on file  Tobacco Use  . Smoking status: Never Smoker  . Smokeless tobacco: Never Used  Vaping Use  . Vaping Use: Never used  Substance and Sexual Activity  . Alcohol use: No    Alcohol/week: 0.0 standard drinks  . Drug use: No  . Sexual activity: Yes    Partners: Male    Birth control/protection: None  Other Topics  Concern  . Not on file  Social History Narrative  . Not on file   Social Determinants of Health   Financial Resource Strain:   . Difficulty of Paying Living Expenses: Not on file  Food Insecurity:   . Worried About Charity fundraiser in the Last Year: Not on file  . Ran Out of Food in the Last Year: Not on file  Transportation Needs:   . Lack of Transportation (Medical): Not on file  . Lack of Transportation (Non-Medical): Not on file  Physical Activity:   . Days of Exercise per Week: Not on file  . Minutes of Exercise per Session: Not on file  Stress:   . Feeling of Stress : Not on file  Social Connections:   . Frequency of Communication with Friends and Family: Not on file  . Frequency of Social Gatherings with Friends and Family: Not on file  . Attends Religious Services: Not on file  . Active Member of Clubs or Organizations: Not on file  . Attends Archivist Meetings: Not on file  . Marital Status: Not on file  Intimate Partner Violence:   . Fear of Current or Ex-Partner: Not on file  . Emotionally Abused: Not on file  . Physically Abused: Not on file  . Sexually Abused: Not on file     Current Outpatient Medications:  .  albuterol (PROVENTIL) (2.5 MG/3ML) 0.083% nebulizer solution, Take 3 mLs (2.5 mg total) by nebulization every 6 (six) hours as needed for wheezing or shortness of breath., Disp: 150 mL, Rfl: 0 .  albuterol (VENTOLIN HFA) 108 (90 Base) MCG/ACT inhaler, USE 2 PUFFS EVERY 4 HOURS AS NEEDED FOR WHEEZING, Disp: 18 g, Rfl: 2 .  escitalopram (LEXAPRO) 20 MG tablet, Take 0.5 tablets (10 mg total) by mouth daily., Disp: 45 tablet, Rfl: 1 .  fluticasone (FLONASE) 50 MCG/ACT nasal spray, Place 2 sprays into both nostrils daily., Disp: 48 g, Rfl: 1 .  hydrOXYzine (ATARAX/VISTARIL) 10 MG tablet, TAKE 1 TABLET (10 MG TOTAL) BY MOUTH 3 (THREE) TIMES DAILY AS NEEDED FOR ANXIETY., Disp: 30 tablet, Rfl: 2 .  loratadine (CLARITIN) 10 MG tablet, Take 1 tablet  (10 mg total) by mouth daily., Disp: 90 tablet, Rfl: 1 .  mupirocin ointment (BACTROBAN) 2 %, SMARTSIG:1 Application Topical 2-3 Times Daily, Disp: , Rfl:  .  propylthiouracil (PTU) 50 MG tablet, Take by mouth. Once a day., Disp: , Rfl:   Current Facility-Administered Medications:  .  0.9 %  sodium chloride infusion, 500 mL, Intravenous, Continuous, Pyrtle, Lajuan Lines, MD  ROS:  Review of Systems  Constitutional: Negative for fatigue, fever and unexpected weight change.  Respiratory: Negative for cough,  shortness of breath and wheezing.   Cardiovascular: Negative for chest pain, palpitations and leg swelling.  Gastrointestinal: Negative for blood in stool, constipation, diarrhea, nausea and vomiting.  Endocrine: Negative for cold intolerance, heat intolerance and polyuria.  Genitourinary: Positive for menstrual problem. Negative for dyspareunia, dysuria, flank pain, frequency, genital sores, hematuria, pelvic pain, urgency, vaginal bleeding, vaginal discharge and vaginal pain.  Musculoskeletal: Negative for back pain, joint swelling and myalgias.  Skin: Negative for rash.  Neurological: Negative for dizziness, syncope, light-headedness, numbness and headaches.  Hematological: Negative for adenopathy.  Psychiatric/Behavioral: Negative for agitation, confusion, sleep disturbance and suicidal ideas. The patient is not nervous/anxious.      Objective: BP 110/70   Ht '5\' 5"'  (1.651 m)   Wt 135 lb (61.2 kg)   LMP 12/03/2019 (Exact Date)   BMI 22.47 kg/m    Physical Exam Constitutional:      Appearance: She is well-developed.  Genitourinary:     Vulva, vagina, uterus, right adnexa and left adnexa normal.     No vulval lesion or tenderness noted.     No vaginal discharge, erythema or tenderness.     No cervical motion tenderness or polyp.     Uterus is not enlarged or tender.     No right or left adnexal mass present.     Right adnexa not tender.     Left adnexa not tender.  Neck:      Thyroid: No thyromegaly.  Cardiovascular:     Rate and Rhythm: Normal rate and regular rhythm.     Heart sounds: Normal heart sounds. No murmur heard.   Pulmonary:     Effort: Pulmonary effort is normal.     Breath sounds: Normal breath sounds.  Chest:     Breasts:        Right: No mass, nipple discharge, skin change or tenderness.        Left: No mass, nipple discharge, skin change or tenderness.  Abdominal:     Palpations: Abdomen is soft.     Tenderness: There is no abdominal tenderness. There is no guarding.  Musculoskeletal:        General: Normal range of motion.     Cervical back: Normal range of motion.  Neurological:     General: No focal deficit present.     Mental Status: She is alert and oriented to person, place, and time.     Cranial Nerves: No cranial nerve deficit.  Skin:    General: Skin is warm and dry.  Psychiatric:        Mood and Affect: Mood normal.        Behavior: Behavior normal.        Thought Content: Thought content normal.        Judgment: Judgment normal.  Vitals reviewed.   ENDOMETRIAL BIOPSY PROCEDURE NOTE:  Pipelle endometrial biopsy was performed using aseptic technique with iodine preparation.  The uterus was sounded to a length of 8.0 cm.  Adequate sampling was obtained with minimal blood loss.  The patient tolerated the procedure well.  Disposition will be pending pathology.   Assessment/Plan: Encounter for annual routine gynecological examination  Cervical cancer screening - Plan: Cytology - PAP  Screening for HPV (human papillomavirus) - Plan: Cytology - PAP  Encounter for screening mammogram for malignant neoplasm of breast - Plan: MM 3D SCREEN BREAST BILATERAL; pt to sched mammo  Family history of breast cancer--Pt is BRCA neg.  Increased risk of breast cancer--IBIS=20.1%. Discussed monthly SBE,  yearly CBE and mammos, as well as scr breast MRI. Pt to consider MRI and f/u if wants 5 months after mammo. Cont Vit D supp.   Abnormal  uterine bleeding (AUB) - Plan: Surgical pathology; 1 episode this cycle. Thyroid normal. Check EMB today and GYN u/s this wk due to sx and Lynch. Will f/u with results. If neg, reassurance.   Lynch syndrome - Plan: Surgical pathology; EMB today. Check GYN u/s, normal ca-125 7/21. Refer to GI for colonoscopy. No FH pancreatic cancer.              GYN counsel breast self exam, mammography screening, adequate intake of calcium and vitamin D, diet and exercise     F/U  Return in about 1 day (around 12/12/2019) for GYN u/s for Lynch syndrome--[facility] to call pt./ 1 yr annual  Ausha Sieh B. Anabeth Chilcott, PA-C 12/11/2019 4:02 PM

## 2019-12-13 LAB — SURGICAL PATHOLOGY

## 2019-12-13 NOTE — Telephone Encounter (Signed)
Pls put pt on 4:00 lab schedule for Fri, 12/3. Thx.

## 2019-12-14 ENCOUNTER — Ambulatory Visit: Payer: 59

## 2019-12-17 LAB — CYTOLOGY - PAP
Comment: NEGATIVE
Diagnosis: NEGATIVE
High risk HPV: NEGATIVE

## 2019-12-20 ENCOUNTER — Other Ambulatory Visit: Payer: 59

## 2019-12-20 ENCOUNTER — Encounter: Payer: Self-pay | Admitting: Obstetrics and Gynecology

## 2019-12-20 ENCOUNTER — Ambulatory Visit (INDEPENDENT_AMBULATORY_CARE_PROVIDER_SITE_OTHER): Payer: 59

## 2019-12-20 ENCOUNTER — Other Ambulatory Visit: Payer: Self-pay

## 2019-12-20 DIAGNOSIS — N920 Excessive and frequent menstruation with regular cycle: Secondary | ICD-10-CM

## 2019-12-20 DIAGNOSIS — D500 Iron deficiency anemia secondary to blood loss (chronic): Secondary | ICD-10-CM

## 2019-12-20 DIAGNOSIS — Z1509 Genetic susceptibility to other malignant neoplasm: Secondary | ICD-10-CM

## 2019-12-20 NOTE — Telephone Encounter (Signed)
Patient is schedule for lab appointment at 2:20 today and gyn u/s and at 2:30

## 2019-12-21 ENCOUNTER — Other Ambulatory Visit: Payer: Self-pay | Admitting: Family Medicine

## 2019-12-21 LAB — CBC WITH DIFFERENTIAL/PLATELET
Basophils Absolute: 0.1 10*3/uL (ref 0.0–0.2)
Basos: 1 %
EOS (ABSOLUTE): 0.4 10*3/uL (ref 0.0–0.4)
Eos: 6 %
Hematocrit: 39.1 % (ref 34.0–46.6)
Hemoglobin: 12.4 g/dL (ref 11.1–15.9)
Immature Grans (Abs): 0 10*3/uL (ref 0.0–0.1)
Immature Granulocytes: 0 %
Lymphocytes Absolute: 1.1 10*3/uL (ref 0.7–3.1)
Lymphs: 17 %
MCH: 28.1 pg (ref 26.6–33.0)
MCHC: 31.7 g/dL (ref 31.5–35.7)
MCV: 89 fL (ref 79–97)
Monocytes Absolute: 0.4 10*3/uL (ref 0.1–0.9)
Monocytes: 6 %
Neutrophils Absolute: 4.3 10*3/uL (ref 1.4–7.0)
Neutrophils: 70 %
Platelets: 206 10*3/uL (ref 150–450)
RBC: 4.42 x10E6/uL (ref 3.77–5.28)
RDW: 14.5 % (ref 11.7–15.4)
WBC: 6.3 10*3/uL (ref 3.4–10.8)

## 2019-12-21 LAB — IRON,TIBC AND FERRITIN PANEL
Ferritin: 18 ng/mL (ref 15–150)
Iron Saturation: 7 % — CL (ref 15–55)
Iron: 23 ug/dL — ABNORMAL LOW (ref 27–159)
Total Iron Binding Capacity: 314 ug/dL (ref 250–450)
UIBC: 291 ug/dL (ref 131–425)

## 2019-12-26 ENCOUNTER — Other Ambulatory Visit: Payer: Self-pay | Admitting: Family Medicine

## 2019-12-26 ENCOUNTER — Encounter: Payer: Self-pay | Admitting: Family Medicine

## 2019-12-28 ENCOUNTER — Encounter: Payer: Self-pay | Admitting: Obstetrics and Gynecology

## 2020-01-08 ENCOUNTER — Encounter: Payer: Self-pay | Admitting: Family Medicine

## 2020-01-10 ENCOUNTER — Encounter: Payer: Self-pay | Admitting: Family Medicine

## 2020-01-10 ENCOUNTER — Telehealth (INDEPENDENT_AMBULATORY_CARE_PROVIDER_SITE_OTHER): Payer: 59 | Admitting: Family Medicine

## 2020-01-10 ENCOUNTER — Other Ambulatory Visit: Payer: Self-pay | Admitting: Family Medicine

## 2020-01-10 ENCOUNTER — Other Ambulatory Visit (INDEPENDENT_AMBULATORY_CARE_PROVIDER_SITE_OTHER): Payer: 59

## 2020-01-10 VITALS — Ht 65.0 in | Wt 130.0 lb

## 2020-01-10 DIAGNOSIS — R519 Headache, unspecified: Secondary | ICD-10-CM | POA: Diagnosis not present

## 2020-01-10 DIAGNOSIS — J988 Other specified respiratory disorders: Secondary | ICD-10-CM | POA: Diagnosis not present

## 2020-01-10 DIAGNOSIS — R059 Cough, unspecified: Secondary | ICD-10-CM | POA: Diagnosis not present

## 2020-01-10 DIAGNOSIS — Z20822 Contact with and (suspected) exposure to covid-19: Secondary | ICD-10-CM | POA: Diagnosis not present

## 2020-01-10 NOTE — Progress Notes (Signed)
Gloria Formosa T. Janis Sol, MD Primary Care and Sports Medicine [facility] at [facility] [address] [place], [address] Phone: [phone]  FAX: [phone]  Gloria Rush - 44 y.o. female  MRN [id number]  Date of Birth: 1975-07-18  Visit Date: 01/10/2020  PCP: Gloria Beat, MD  Referred by: Gloria Beat, MD  Virtual Visit via Video Note:  I connected with  Gloria Rush on 01/10/2020  2:40 PM EST by a video enabled telemedicine application and verified that I am speaking with the correct person using two identifiers.   Location patient: home computer, tablet, or smartphone Location provider: work or home office Consent: Verbal consent directly obtained from Gloria Rush. Persons participating in the virtual visit: patient, provider  I discussed the limitations of evaluation and management by telemedicine and the availability of in person appointments. The patient expressed understanding and agreed to proceed.  Chief Complaint  Patient presents with  . Nasal Congestion    2- Negative Covid Test   . Fatigue  . Facial Pain    History of Present Illness:  She is a very well-known patient, and she presents for video visit in follow-up after having become sick.  She globally feels quite unwell.  She has quite a bit of fatigue, facial pain and she globally feels poorly and has some body aches.  She was worried about potential COVID-19, so she took 2 at home test, and the first 1 was less than 6 hours after onset of symptoms, and the second 1 was less than 48 hours after symptoms began.  She also has a significant headache.  She also has some significant pain predominantly in the right side of her maxillary sinus.  Face hurts a lot. HA  Tested Day of and < 48 hours later, home tests Night is bad R max sinuses  Has a zpak and steroids at home  Immunization History  Administered Date(s) Administered  . Influenza Split 12/24/2010   . Influenza Whole 12/27/2006, 10/10/2007, 11/19/2009  . Influenza, Quadrivalent, Recombinant, Inj, Pf 11/19/2015, 11/26/2016  . Influenza,inj,Quad PF,6+ Mos 09/29/2018  . Influenza,trivalent, recombinat, inj, PF 12/21/2013  . Influenza-Unspecified 11/25/2012, 01/29/2015  . PFIZER SARS-COV-2 Vaccination 08/13/2019, 09/10/2019  . PPD Test 07/14/2010  . Tdap 10/11/2011     Review of Systems as above: See pertinent positives and pertinent negatives per HPI No acute distress verbally   Observations/Objective/Exam:  An attempt was made to discern vital signs over the phone and per patient if applicable and possible.   General:    Alert, Oriented, appears well and in no acute distress  Pulmonary:     On inspection no signs of respiratory distress.  Psych / Neurological:     Pleasant and cooperative.  Assessment and Plan:    ICD-10-CM   1. Cough  R05.9   2. Facial pain  R51.9   3. Congestion of respiratory tract  J98.8   4. Suspected COVID-19 virus infection  Z20.822    At home Covid testing with minimal timeframe post symptoms are very high chance of false negative.  She is going to get COVID-19 testing today, and we will see what this shows.  She also has some prednisone, and given that she has asthma bastard to go ahead and start this.  She also has some Zithromax at home already, and I think that if her Covid is negative and she also has difficulty breathing or worsening at all, then I asked her  to start this.  She is a very reliable patient.  I discussed the assessment and treatment plan with the patient. The patient was provided an opportunity to ask questions and all were answered. The patient agreed with the plan and demonstrated an understanding of the instructions.   The patient was advised to call back or seek an in-person evaluation if the symptoms worsen or if the condition fails to improve as anticipated.  Follow-up: prn unless noted otherwise below No follow-ups on  file.  No orders of the defined types were placed in this encounter.  No orders of the defined types were placed in this encounter.   Signed,  Gloria Deed. Kele Barthelemy, MD

## 2020-01-11 ENCOUNTER — Encounter: Payer: Self-pay | Admitting: Family Medicine

## 2020-01-12 ENCOUNTER — Encounter: Payer: Self-pay | Admitting: Family Medicine

## 2020-01-13 LAB — SPECIMEN STATUS REPORT

## 2020-01-13 LAB — NOVEL CORONAVIRUS, NAA: SARS-CoV-2, NAA: NOT DETECTED

## 2020-01-13 MED ORDER — POLYMYXIN B-TRIMETHOPRIM 10000-0.1 UNIT/ML-% OP SOLN: 1.0000 [drp] | OPHTHALMIC | 0 refills | Status: AC

## 2020-01-27 ENCOUNTER — Encounter: Payer: Self-pay | Admitting: Family Medicine

## 2020-02-07 ENCOUNTER — Ambulatory Visit: Payer: 59 | Admitting: Family Medicine

## 2020-02-11 ENCOUNTER — Other Ambulatory Visit: Payer: Self-pay | Admitting: Family Medicine

## 2020-02-13 ENCOUNTER — Ambulatory Visit: Payer: 59 | Admitting: Family Medicine

## 2020-03-04 ENCOUNTER — Other Ambulatory Visit: Payer: Self-pay

## 2020-03-04 ENCOUNTER — Ambulatory Visit
Admission: RE | Admit: 2020-03-04 | Discharge: 2020-03-04 | Disposition: A | Discharge status: Home / Self Care | Payer: 59 | Source: Ambulatory Visit | Attending: Obstetrics and Gynecology | Admitting: Obstetrics and Gynecology

## 2020-03-04 DIAGNOSIS — Z1231 Encounter for screening mammogram for malignant neoplasm of breast: Secondary | ICD-10-CM | POA: Diagnosis not present

## 2020-03-06 ENCOUNTER — Other Ambulatory Visit: Payer: Self-pay | Admitting: Family Medicine

## 2020-03-06 NOTE — Telephone Encounter (Signed)
Last office visit 01/10/2020 for cough/facial pain/congestion.  Nystatin not on current medication list.  No future appointments.

## 2020-03-09 ENCOUNTER — Encounter: Payer: Self-pay | Admitting: Obstetrics and Gynecology

## 2020-03-24 ENCOUNTER — Other Ambulatory Visit: Payer: Self-pay | Admitting: Family Medicine

## 2020-06-13 ENCOUNTER — Other Ambulatory Visit: Payer: Self-pay | Admitting: Family Medicine

## 2020-06-27 MED ORDER — LIDOCAINE VISCOUS HCL 2 % MT SOLN: 5.0000 mL | Freq: Four times a day (QID) | OROMUCOSAL | 1 refills | Status: DC | PRN

## 2020-07-22 ENCOUNTER — Other Ambulatory Visit: Payer: Self-pay | Admitting: Family Medicine

## 2020-07-22 DIAGNOSIS — Z79899 Other long term (current) drug therapy: Secondary | ICD-10-CM

## 2020-07-22 DIAGNOSIS — E0590 Thyrotoxicosis, unspecified without thyrotoxic crisis or storm: Secondary | ICD-10-CM

## 2020-07-22 DIAGNOSIS — Z131 Encounter for screening for diabetes mellitus: Secondary | ICD-10-CM

## 2020-07-22 DIAGNOSIS — Z1159 Encounter for screening for other viral diseases: Secondary | ICD-10-CM

## 2020-07-22 DIAGNOSIS — R7989 Other specified abnormal findings of blood chemistry: Secondary | ICD-10-CM

## 2020-07-22 DIAGNOSIS — Z13220 Encounter for screening for lipoid disorders: Secondary | ICD-10-CM

## 2020-07-22 NOTE — Telephone Encounter (Signed)
.  drawcp

## 2020-07-22 NOTE — Telephone Encounter (Signed)
She is supposed to have a blood draw tomorrow, and can we make sure we draw:   FLP, Z13.220 (screening for high cholesterol) Hemoglobin a1c: screening for diabetes Cbc with diff, HFP, BMET: R53.83 long-term medication usage  TSH, Free T3, Free T4: E03.9  hyperthyroidism  Vit D: E55.9 low vitamin d levels  Hepatitis C: Z11.59 screening for hepatitis c

## 2020-07-23 ENCOUNTER — Other Ambulatory Visit: Payer: Self-pay

## 2020-07-23 ENCOUNTER — Other Ambulatory Visit (INDEPENDENT_AMBULATORY_CARE_PROVIDER_SITE_OTHER): Payer: 59

## 2020-07-23 DIAGNOSIS — D500 Iron deficiency anemia secondary to blood loss (chronic): Secondary | ICD-10-CM | POA: Diagnosis not present

## 2020-07-23 DIAGNOSIS — Z79899 Other long term (current) drug therapy: Secondary | ICD-10-CM | POA: Diagnosis not present

## 2020-07-23 DIAGNOSIS — E0590 Thyrotoxicosis, unspecified without thyrotoxic crisis or storm: Secondary | ICD-10-CM

## 2020-07-23 DIAGNOSIS — Z131 Encounter for screening for diabetes mellitus: Secondary | ICD-10-CM

## 2020-07-23 DIAGNOSIS — Z13220 Encounter for screening for lipoid disorders: Secondary | ICD-10-CM | POA: Diagnosis not present

## 2020-07-23 DIAGNOSIS — R7989 Other specified abnormal findings of blood chemistry: Secondary | ICD-10-CM | POA: Diagnosis not present

## 2020-07-23 DIAGNOSIS — Z1159 Encounter for screening for other viral diseases: Secondary | ICD-10-CM

## 2020-07-23 LAB — BASIC METABOLIC PANEL
BUN: 8 mg/dL (ref 6–23)
CO2: 27 mEq/L (ref 19–32)
Calcium: 9 mg/dL (ref 8.4–10.5)
Chloride: 105 mEq/L (ref 96–112)
Creatinine, Ser: 0.83 mg/dL (ref 0.40–1.20)
GFR: 86.41 mL/min (ref >60.00)
Glucose, Bld: 88 mg/dL (ref 70–99)
Potassium: 4.1 mEq/L (ref 3.5–5.1)
Sodium: 138 mEq/L (ref 135–145)

## 2020-07-23 LAB — CBC WITH DIFFERENTIAL/PLATELET
Basophils Absolute: 0.1 10*3/uL (ref 0.0–0.1)
Basophils Relative: 1.3 % (ref 0.0–3.0)
Eosinophils Absolute: 0.4 10*3/uL (ref 0.0–0.7)
Eosinophils Relative: 6.6 % — ABNORMAL HIGH (ref 0.0–5.0)
HCT: 36.8 % (ref 36.0–46.0)
Hemoglobin: 11.7 g/dL — ABNORMAL LOW (ref 12.0–15.0)
Lymphocytes Relative: 23.2 % (ref 12.0–46.0)
Lymphs Abs: 1.4 10*3/uL (ref 0.7–4.0)
MCHC: 31.7 g/dL (ref 30.0–36.0)
MCV: 82.8 fl (ref 78.0–100.0)
Monocytes Absolute: 0.3 10*3/uL (ref 0.1–1.0)
Monocytes Relative: 5.9 % (ref 3.0–12.0)
Neutro Abs: 3.7 10*3/uL (ref 1.4–7.7)
Neutrophils Relative %: 63 % (ref 43.0–77.0)
Platelets: 187 10*3/uL (ref 150.0–400.0)
RBC: 4.45 Mil/uL (ref 3.87–5.11)
RDW: 18.2 % — ABNORMAL HIGH (ref 11.5–15.5)
WBC: 5.8 10*3/uL (ref 4.0–10.5)

## 2020-07-23 LAB — IBC + FERRITIN
Ferritin: 9.2 ng/mL — ABNORMAL LOW (ref 10.0–291.0)
Iron: 89 ug/dL (ref 42–145)
Saturation Ratios: 22.5 % (ref 20.0–50.0)
Transferrin: 283 mg/dL (ref 212.0–360.0)

## 2020-07-23 LAB — LIPID PANEL
Cholesterol: 171 mg/dL (ref 0–200)
HDL: 66.1 mg/dL (ref >39.00)
LDL Cholesterol: 90 mg/dL (ref 0–99)
NonHDL: 104.61
Total CHOL/HDL Ratio: 3
Triglycerides: 71 mg/dL (ref 0.0–149.0)
VLDL: 14.2 mg/dL (ref 0.0–40.0)

## 2020-07-23 LAB — HEPATIC FUNCTION PANEL
ALT: 8 U/L (ref 0–35)
AST: 12 U/L (ref 0–37)
Albumin: 4.1 g/dL (ref 3.5–5.2)
Alkaline Phosphatase: 82 U/L (ref 39–117)
Bilirubin, Direct: 0.1 mg/dL (ref 0.0–0.3)
Total Bilirubin: 0.5 mg/dL (ref 0.2–1.2)
Total Protein: 6.7 g/dL (ref 6.0–8.3)

## 2020-07-23 LAB — VITAMIN D 25 HYDROXY (VIT D DEFICIENCY, FRACTURES): VITD: 42.79 ng/mL (ref 30.00–100.00)

## 2020-07-23 LAB — HEMOGLOBIN A1C: Hgb A1c MFr Bld: 5.3 % (ref 4.6–6.5)

## 2020-07-23 LAB — TSH: TSH: 1.41 u[IU]/mL (ref 0.35–5.50)

## 2020-07-23 LAB — T4, FREE: Free T4: 0.61 ng/dL (ref 0.60–1.60)

## 2020-07-23 LAB — T3, FREE: T3, Free: 4.1 pg/mL (ref 2.3–4.2)

## 2020-07-24 LAB — HEPATITIS C ANTIBODY
Hepatitis C Ab: NONREACTIVE
SIGNAL TO CUT-OFF: 0.02 (ref <1.00)

## 2020-07-30 ENCOUNTER — Ambulatory Visit (INDEPENDENT_AMBULATORY_CARE_PROVIDER_SITE_OTHER): Payer: 59 | Admitting: Family Medicine

## 2020-07-30 ENCOUNTER — Encounter: Payer: Self-pay | Admitting: Family Medicine

## 2020-07-30 ENCOUNTER — Other Ambulatory Visit: Payer: Self-pay

## 2020-07-30 VITALS — BP 100/60 | HR 85 | Temp 98.0°F | Ht 64.25 in | Wt 140.5 lb

## 2020-07-30 DIAGNOSIS — Z9189 Other specified personal risk factors, not elsewhere classified: Secondary | ICD-10-CM

## 2020-07-30 DIAGNOSIS — Z23 Encounter for immunization: Secondary | ICD-10-CM

## 2020-07-30 DIAGNOSIS — Z0000 Encounter for general adult medical examination without abnormal findings: Secondary | ICD-10-CM | POA: Diagnosis not present

## 2020-07-30 DIAGNOSIS — J4540 Moderate persistent asthma, uncomplicated: Secondary | ICD-10-CM | POA: Diagnosis not present

## 2020-07-30 NOTE — Progress Notes (Signed)
Joee Iovine T. Tirrell Buchberger, MD, [place] at [facility] [place], [address]  Phone: [phone]  FAX: [place] - 45 y.o. female  MRN [id number]  Date of Birth: 05-22-1975  Date: 07/30/2020  PCP: Owens Loffler, MD  Referral: Owens Loffler, MD  Chief Complaint  Patient presents with   Annual Exam    This visit occurred during the SARS-CoV-2 public health emergency.  Safety protocols were in place, including screening questions prior to the visit, additional usage of staff PPE, and extensive cleaning of exam room while observing appropriate contact time as indicated for disinfecting solutions.   Patient Care Team: Owens Loffler, MD as PCP - General Subjective:   NALANI ANDREEN is a 45 y.o. pleasant patient who presents with the following:  Health Maintenance Summary Reviewed and updated, unless pt declines services.  Tobacco History Reviewed. Non-smoker Alcohol: No concerns, no excessive use Exercise Habits: ec at least 30 mins 5 times a week.  She has been running regularly, but she did have a physician who takes care of her husband recommend that she stop running.  She does not have any baseline knee pain.  She is ideal body weight. STD concerns: none Drug Use: None Lumps or breast concerns: no  She does have Lynch syndrome.  Recommend repeat colonoscopy.  Prevnar-20 Colon recall - insurance will not pay until 47 Covid #3 - Had covid a few months ago.   R lateral leg ? Resolving hematoma.    Health Maintenance  Topic Date Due   Pneumococcal Vaccine 75-89 Years old (1 - PCV) Never done   COLONOSCOPY (Pts 45-42yr Insurance coverage will need to be confirmed)  03/30/2017   COVID-19 Vaccine (3 - Pfizer risk series) 10/08/2019   INFLUENZA VACCINE  08/11/2020   TETANUS/TDAP  10/10/2021   PAP SMEAR-Modifier  12/10/2024   Hepatitis C Screening  Completed   HIV Screening  Completed    HPV VACCINES  Aged Out    Immunization History  Administered Date(s) Administered   Influenza Split 12/24/2010   Influenza Whole 12/27/2006, 10/10/2007, 11/19/2009   Influenza, Quadrivalent, Recombinant, Inj, Pf 11/19/2015, 11/26/2016   Influenza,inj,Quad PF,6+ Mos 09/29/2018   Influenza,trivalent, recombinat, inj, PF 12/21/2013   Influenza-Unspecified 11/25/2012, 01/29/2015   PFIZER(Purple Top)SARS-COV-2 Vaccination 08/13/2019, 09/10/2019   PNEUMOCOCCAL CONJUGATE-20 07/30/2020   PPD Test 07/14/2010   Tdap 10/11/2011   Patient Active Problem List   Diagnosis Date Noted   Lynch syndrome 04/13/2013    Priority: High   Major depressive disorder, recurrent, in full remission ([place] 12/18/2009    Priority: Medium   Panic disorder without agoraphobia with panic attacks in partial remission 01/23/2008    Priority: Medium   Asthma, moderate persistent, well-controlled 12/27/2006    Priority: Medium   Increased risk of breast cancer 10/31/2018   Family history of breast cancer 06/11/2016   Unspecified vitamin D deficiency 08/24/2012   ALLERGIC CONJUNCTIVITIS 12/23/2008   DERMATITIS, ATOPIC 12/23/2008   GERD 12/05/2007   Allergic rhinitis 12/27/2006    Past Medical History:  Diagnosis Date   Anxiety    Asthma    Family history of breast cancer    Fibroid    Genetic testing of female    MyRisk Positive   Increased risk of breast cancer 2014, 2018   riskscore=22.2% 6/18   Infertility, female    Lynch syndrome    PMS2 pos 11/14, ATM VUS   Panic disorder  Past Surgical History:  Procedure Laterality Date   WISDOM TOOTH EXTRACTION      Family History  Problem Relation Age of Onset   Anxiety disorder Mother    Anxiety disorder Brother    Diabetes Maternal Grandfather    Breast cancer Maternal Grandmother 40   Breast cancer Other 7   Colon cancer Neg Hx    Stomach cancer Neg Hx     Past Medical History, Surgical History, Social History, Family History, Problem  List, Medications, and Allergies have been reviewed and updated if relevant.  Review of Systems: Pertinent positives are listed above.  Otherwise, a full 14 point review of systems has been done in full and it is negative except where it is noted positive.  Objective:   BP 100/60   Pulse 85   Temp 98 F (36.7 C) (Temporal)   Ht 5' 4.25" (1.632 m)   Wt 140 lb 8 oz (63.7 kg)   SpO2 98%   BMI 23.93 kg/m  Ideal Body Weight: Weight in (lb) to have BMI = 25: 146.5 No results found. Depression screen [facility] 2/9 08/07/2018  Decreased Interest 0  Down, Depressed, Hopeless 0  PHQ - 2 Score 0  Some recent data might be hidden     GEN: well developed, well nourished, no acute distress Eyes: conjunctiva and lids normal, PERRLA, EOMI ENT: TM clear, nares clear, oral exam WNL Neck: supple, no lymphadenopathy, no thyromegaly, no JVD Pulm: clear to auscultation and percussion, respiratory effort normal CV: regular rate and rhythm, S1-S2, no murmur, rub or gallop, no bruits Chest: no scars, masses, no lumps BREAST: breast exam declined GI: soft, non-tender; no hepatosplenomegaly, masses; active bowel sounds all quadrants GU: GU exam declined Lymph: no cervical, axillary or inguinal adenopathy MSK: gait normal, muscle tone and strength WNL, no joint swelling, effusions, discoloration, crepitus  SKIN: clear, good turgor, color WNL, no rashes, lesions, or ulcerations Neuro: normal mental status, normal strength, sensation, and motion Psych: alert; oriented to person, place and time, normally interactive and not anxious or depressed in appearance.   All labs reviewed with patient. Results for orders placed or performed in visit on 07/23/20  Lipid panel  Result Value Ref Range   Cholesterol 171 0 - 200 mg/dL   Triglycerides 71.0 0.0 - 149.0 mg/dL   HDL 66.10 >39.00 mg/dL   VLDL 14.2 0.0 - 40.0 mg/dL   LDL Cholesterol 90 0 - 99 mg/dL   Total CHOL/HDL Ratio 3    NonHDL 104.61   Hemoglobin A1c   Result Value Ref Range   Hgb A1c MFr Bld 5.3 4.6 - 6.5 %  Basic metabolic panel  Result Value Ref Range   Sodium 138 135 - 145 mEq/L   Potassium 4.1 3.5 - 5.1 mEq/L   Chloride 105 96 - 112 mEq/L   CO2 27 19 - 32 mEq/L   Glucose, Bld 88 70 - 99 mg/dL   BUN 8 6 - 23 mg/dL   Creatinine, Ser 0.83 0.40 - 1.20 mg/dL   GFR 86.41 >60.00 mL/min   Calcium 9.0 8.4 - 10.5 mg/dL  Hepatic function panel  Result Value Ref Range   Total Bilirubin 0.5 0.2 - 1.2 mg/dL   Bilirubin, Direct 0.1 0.0 - 0.3 mg/dL   Alkaline Phosphatase 82 39 - 117 U/L   AST 12 0 - 37 U/L   ALT 8 0 - 35 U/L   Total Protein 6.7 6.0 - 8.3 g/dL   Albumin 4.1 3.5 - 5.2  g/dL  CBC with Differential/Platelet  Result Value Ref Range   WBC 5.8 4.0 - 10.5 K/uL   RBC 4.45 3.87 - 5.11 Mil/uL   Hemoglobin 11.7 (L) 12.0 - 15.0 g/dL   HCT 36.8 36.0 - 46.0 %   MCV 82.8 78.0 - 100.0 fl   MCHC 31.7 30.0 - 36.0 g/dL   RDW 18.2 (H) 11.5 - 15.5 %   Platelets 187.0 150.0 - 400.0 K/uL   Neutrophils Relative % 63.0 43.0 - 77.0 %   Lymphocytes Relative 23.2 12.0 - 46.0 %   Monocytes Relative 5.9 3.0 - 12.0 %   Eosinophils Relative 6.6 (H) 0.0 - 5.0 %   Basophils Relative 1.3 0.0 - 3.0 %   Neutro Abs 3.7 1.4 - 7.7 K/uL   Lymphs Abs 1.4 0.7 - 4.0 K/uL   Monocytes Absolute 0.3 0.1 - 1.0 K/uL   Eosinophils Absolute 0.4 0.0 - 0.7 K/uL   Basophils Absolute 0.1 0.0 - 0.1 K/uL  T3, free  Result Value Ref Range   T3, Free 4.1 2.3 - 4.2 pg/mL  T4, free  Result Value Ref Range   Free T4 0.61 0.60 - 1.60 ng/dL  TSH  Result Value Ref Range   TSH 1.41 0.35 - 5.50 uIU/mL  VITAMIN D 25 Hydroxy (Vit-D Deficiency, Fractures)  Result Value Ref Range   VITD 42.79 30.00 - 100.00 ng/mL  Hepatitis C antibody  Result Value Ref Range   Hepatitis C Ab NON-REACTIVE NON-REACTIVE   SIGNAL TO CUT-OFF 0.02 <1.00  IBC + Ferritin  Result Value Ref Range   Iron 89 42 - 145 ug/dL   Transferrin 283.0 212.0 - 360.0 mg/dL   Saturation Ratios 22.5 20.0 -  50.0 %   Ferritin 9.2 (L) 10.0 - 291.0 ng/mL   No results found.  Assessment and Plan:     ICD-10-CM   1. Healthcare maintenance  Z00.00 Pneumococcal conjugate vaccine 20-valent (Prevnar 20)    2. Asthma, moderate persistent, well-controlled  J45.40 Pneumococcal conjugate vaccine 20-valent (Prevnar 20)    3. At high risk for pneumonia  Z91.89      Labs all look stable.  Hemoglobin is 11.7.  Continue to take iron daily.  Encouraged her to work out and there is no reason why she could not push the running program.  She is at ideal body weight, she has excellent strength, she has no underlying knee problems.  I told her that she can push exercise at any level to a high degree.  She has increased risk of pneumonia secondary to asthma.  We will give her Prevnar 20 today.  Health Maintenance Exam: The patient's preventative maintenance and recommended screening tests for an annual wellness exam were reviewed in full today. Brought up to date unless services declined.  Counselled on the importance of diet, exercise, and its role in overall health and mortality. The patient's FH and SH was reviewed, including their home life, tobacco status, and drug and alcohol status.  Follow-up in 1 year for physical exam or additional follow-up below.  Follow-up: No follow-ups on file. Or follow-up in 1 year if not noted.  No future appointments.  No orders of the defined types were placed in this encounter.  Medications Discontinued During This Encounter  Medication Reason   magic mouthwash (lidocaine, diphenhydrAMINE, alum & mag hydroxide) suspension Completed Course   Orders Placed This Encounter  Procedures   Pneumococcal conjugate vaccine 20-valent (Prevnar 20)     Signed,  Jayon Matton T. Ikram Riebe,  MD   Allergies as of 07/30/2020       Reactions   Amoxicillin    Itching (once), no rash Rapid heart rate   Chicken Meat (diagnostic) Other (See Comments)   Throat feels tight and GERD    Dexilant [dexlansoprazole] Other (See Comments)   Throat feels tight        Medication List        Accurate as of July 30, 2020 10:06 AM. If you have any questions, ask your nurse or doctor.          STOP taking these medications    magic mouthwash (lidocaine, diphenhydrAMINE, alum & mag hydroxide) suspension Stopped by: Owens Loffler, MD       TAKE these medications    albuterol (2.5 MG/3ML) 0.083% nebulizer solution Commonly known as: PROVENTIL Take 3 mLs (2.5 mg total) by nebulization every 6 (six) hours as needed for wheezing or shortness of breath.   albuterol 108 (90 Base) MCG/ACT inhaler Commonly known as: Ventolin HFA USE 2 PUFFS EVERY 4 HOURS AS NEEDED FOR WHEEZING   escitalopram 20 MG tablet Commonly known as: LEXAPRO TAKE 1/2 TABLET BY MOUTH DAILY   fluticasone 50 MCG/ACT nasal spray Commonly known as: FLONASE Place 2 sprays into both nostrils daily.   hydrOXYzine 10 MG tablet Commonly known as: ATARAX/VISTARIL TAKE 1 TABLET BY MOUTH 3 TIMES DAILY AS NEEDED FOR ANXIETY.   loratadine 10 MG tablet Commonly known as: Claritin Take 1 tablet (10 mg total) by mouth daily.   mupirocin ointment 2 % Commonly known as: BACTROBAN SMARTSIG:1 Application Topical 2-3 Times Daily   nystatin 100000 UNIT/ML suspension Commonly known as: MYCOSTATIN TAKE 5 MLS (500,000 UNITS TOTAL) BY MOUTH 4 (FOUR) TIMES DAILY.   propylthiouracil 50 MG tablet Commonly known as: PTU Take by mouth. Once a day.

## 2020-08-09 ENCOUNTER — Encounter: Payer: Self-pay | Admitting: Obstetrics and Gynecology

## 2020-08-13 ENCOUNTER — Telehealth: Payer: Self-pay | Admitting: Obstetrics and Gynecology

## 2020-08-13 ENCOUNTER — Other Ambulatory Visit: Payer: Self-pay | Admitting: Obstetrics and Gynecology

## 2020-08-13 DIAGNOSIS — N939 Abnormal uterine and vaginal bleeding, unspecified: Secondary | ICD-10-CM

## 2020-08-13 MED ORDER — NORETHINDRONE ACETATE 5 MG PO TABS: 5.0000 mg | ORAL_TABLET | Freq: Every day | ORAL | 0 refills | Status: DC

## 2020-08-13 NOTE — Telephone Encounter (Signed)
Pt aware.

## 2020-08-13 NOTE — Telephone Encounter (Signed)
Rx aygestin eRxd. Take daily for 7 days. F/u prn

## 2020-08-13 NOTE — Telephone Encounter (Signed)
Patient calling and is still bleeding. Would like to speak with ABC. Would like meds called to the [facility] in [place].   CB# [phone]

## 2020-08-13 NOTE — Progress Notes (Signed)
Rx aygestin prn AUB this cycle. F/u for further eval if sx persist.

## 2020-09-11 ENCOUNTER — Other Ambulatory Visit: Payer: Self-pay | Admitting: Family Medicine

## 2020-09-17 ENCOUNTER — Other Ambulatory Visit: Payer: Self-pay | Admitting: Family Medicine

## 2020-10-23 ENCOUNTER — Encounter: Payer: Self-pay | Admitting: Obstetrics and Gynecology

## 2020-11-27 ENCOUNTER — Other Ambulatory Visit: Payer: Self-pay | Admitting: Family Medicine

## 2020-12-23 ENCOUNTER — Encounter: Payer: Self-pay | Admitting: Family Medicine

## 2021-01-21 ENCOUNTER — Encounter: Payer: Self-pay | Admitting: Family Medicine

## 2021-02-17 ENCOUNTER — Encounter: Payer: Self-pay | Admitting: Family Medicine

## 2021-02-18 ENCOUNTER — Encounter: Payer: Self-pay | Admitting: Physician Assistant

## 2021-02-18 ENCOUNTER — Telehealth: Payer: Managed Care, Other (non HMO) | Admitting: Physician Assistant

## 2021-02-18 DIAGNOSIS — J069 Acute upper respiratory infection, unspecified: Secondary | ICD-10-CM | POA: Diagnosis not present

## 2021-02-18 MED ORDER — BENZONATATE 100 MG PO CAPS: 100.0000 mg | ORAL_CAPSULE | Freq: Three times a day (TID) | ORAL | 0 refills | Status: DC | PRN

## 2021-02-18 MED ORDER — IPRATROPIUM BROMIDE 0.03 % NA SOLN: 2.0000 | Freq: Two times a day (BID) | NASAL | 0 refills | Status: DC

## 2021-02-18 MED ORDER — FLUTICASONE PROPIONATE 50 MCG/ACT NA SUSP: 2.0000 | Freq: Every day | NASAL | 0 refills | Status: DC

## 2021-02-18 NOTE — Patient Instructions (Signed)
Gloria Rush, thank you for joining [name] Daring, PA-C for today's virtual visit.  While this provider is not your primary care provider (PCP), if your PCP is located in our provider database this encounter information will be shared with them immediately following your visit.  Consent: (Patient) Gloria Kitchen Rush provided verbal consent for this virtual visit at the beginning of the encounter.  Current Medications:  Current Outpatient Medications:    benzonatate (TESSALON) 100 MG capsule, Take 1 capsule (100 mg total) by mouth 3 (three) times daily as needed., Disp: 30 capsule, Rfl: 0   fluticasone (FLONASE) 50 MCG/ACT nasal spray, Place 2 sprays into both nostrils daily., Disp: 16 g, Rfl: 0   ipratropium (ATROVENT) 0.03 % nasal spray, Place 2 sprays into both nostrils every 12 (twelve) hours., Disp: 30 mL, Rfl: 0   albuterol (PROVENTIL) (2.5 MG/3ML) 0.083% nebulizer solution, Take 3 mLs (2.5 mg total) by nebulization every 6 (six) hours as needed for wheezing or shortness of breath., Disp: 150 mL, Rfl: 0   albuterol (VENTOLIN HFA) 108 (90 Base) MCG/ACT inhaler, USE 2 PUFFS EVERY 4 HOURS AS NEEDED FOR WHEEZING, Disp: 18 g, Rfl: 2   escitalopram (LEXAPRO) 20 MG tablet, TAKE 1/2 TABLET BY MOUTH DAILY, Disp: 45 tablet, Rfl: 1   hydrOXYzine (ATARAX/VISTARIL) 10 MG tablet, TAKE 1 TABLET BY MOUTH THREE TIMES A DAY AS NEEDED FOR ANXIETY, Disp: 30 tablet, Rfl: 2   loratadine (CLARITIN) 10 MG tablet, Take 1 tablet (10 mg total) by mouth daily., Disp: 90 tablet, Rfl: 1   mupirocin ointment (BACTROBAN) 2 %, SMARTSIG:1 Application Topical 2-3 Times Daily, Disp: , Rfl:    norethindrone (AYGESTIN) 5 MG tablet, Take 1 tablet (5 mg total) by mouth daily for 7 days., Disp: 7 tablet, Rfl: 0   nystatin (MYCOSTATIN) 100000 UNIT/ML suspension, TAKE 5 MLS (500,000 UNITS TOTAL) BY MOUTH 4 (FOUR) TIMES DAILY., Disp: 473 mL, Rfl: 0   propylthiouracil (PTU) 50 MG tablet, Take by mouth. Once a day., Disp: , Rfl:    Current Facility-Administered Medications:    0.9 %  sodium chloride infusion, 500 mL, Intravenous, Continuous, Pyrtle, Lajuan Lines, MD   Medications ordered in this encounter:  Meds ordered this encounter  Medications   fluticasone (FLONASE) 50 MCG/ACT nasal spray    Sig: Place 2 sprays into both nostrils daily.    Dispense:  16 g    Refill:  0    Order Specific Question:   Supervising Provider    Answer:   MILLER, BRIAN [[id number]]   ipratropium (ATROVENT) 0.03 % nasal spray    Sig: Place 2 sprays into both nostrils every 12 (twelve) hours.    Dispense:  30 mL    Refill:  0    Order Specific Question:   Supervising Provider    Answer:   MILLER, BRIAN [[id number]]   benzonatate (TESSALON) 100 MG capsule    Sig: Take 1 capsule (100 mg total) by mouth 3 (three) times daily as needed.    Dispense:  30 capsule    Refill:  0    Order Specific Question:   Supervising Provider    Answer:   Sabra Heck, [place]     *If you need refills on other medications prior to your next appointment, please contact your pharmacy*  Follow-Up: Call back or seek an in-person evaluation if the symptoms worsen or if the condition fails to improve as anticipated.  Other Instructions Viral Respiratory Infection A respiratory infection is an illness  that affects part of the respiratory system, such as the lungs, nose, or throat. A respiratory infection that is caused by a virus is called a viral respiratory infection. Common types of viral respiratory infections include: A cold. The flu (influenza). A respiratory syncytial virus (RSV) infection. What are the causes? This condition is caused by a virus. The virus may spread through contact with droplets or direct contact with infected people or their mucus or secretions. The virus may spread from person to person (is contagious). What are the signs or symptoms? Symptoms of this condition include: A stuffy or runny nose. A sore throat or cough. Shortness of breath  or difficulty breathing. Yellow or green mucus (sputum). Other symptoms may include: A fever. Sweating or chills. Fatigue. Achy muscles. A headache. How is this diagnosed? This condition may be diagnosed based on: Your symptoms. A physical exam. Testing of secretions from the nose or throat. Chest X-ray. How is this treated? This condition may be treated with medicines, such as: Antiviral medicine. This may shorten the length of time a person has symptoms. Expectorants. These make it easier to cough up mucus. Decongestant nasal sprays. Acetaminophen or NSAIDs, such as ibuprofen, to relieve fever and pain. Antibiotic medicines are not prescribed for viral infections.This is because antibiotics are designed to kill bacteria. They do not kill viruses. Follow these instructions at home: Managing pain and congestion Take over-the-counter and prescription medicines only as told by your health care provider. If you have a sore throat, gargle with a mixture of salt and water 3-4 times a day or as needed. To make salt water, completely dissolve -1 tsp (3-6 g) of salt in 1 cup (237 mL) of warm water. Use nose drops made from salt water to ease congestion and soften raw skin around your nose. Take 2 tsp (10 mL) of honey at bedtime to lessen coughing at night. Do not give honey to children who are younger than 1 year. Drink enough fluid to keep your urine pale yellow. This helps prevent dehydration and helps loosen up mucus. General instructions  Rest as much as possible. Do not drink alcohol. Do not use any products that contain nicotine or tobacco. These products include cigarettes, chewing tobacco, and vaping devices, such as e-cigarettes. If you need help quitting, ask your health care provider. Keep all follow-up visits. This is important. How is this prevented?   Get an annual flu shot. You may get the flu shot in late summer, fall, or winter. Ask your health care provider when you  should get your flu shot. Avoid spreading your infection to other people. If you are sick: Wash your hands with soap and water often, especially after you cough or sneeze. Wash for at least 20 seconds. If soap and water are not available, use alcohol-based hand sanitizer. Cover your mouth when you cough. Cover your nose and mouth when you sneeze. Do not share cups or eating utensils. Clean commonly used objects often. Clean commonly touched surfaces. Stay home from work or school as told by your health care provider. Avoid contact with people who are sick during cold and flu season. This is generally fall and winter. Contact a health care provider if: Your symptoms last for 10 days or longer. Your symptoms get worse over time. You have severe sinus pain in your face or forehead. The glands in your jaw or neck become very swollen. You have shortness of breath. Get help right away if you: Feel pain or pressure in  your chest. Have trouble breathing. Faint or feel like you will faint. Have severe and persistent vomiting. Feel confused or disoriented. These symptoms may represent a serious problem that is an emergency. Do not wait to see if the symptoms will go away. Get medical help right away. Call your local emergency services (911 in the [place] Do not drive yourself to the hospital. Summary A respiratory infection is an illness that affects part of the respiratory system, such as the lungs, nose, or throat. A respiratory infection that is caused by a virus is called a viral respiratory infection. Common types of viral respiratory infections include a cold, influenza, and respiratory syncytial virus (RSV) infection. Symptoms of this condition include a stuffy or runny nose, cough, fatigue, achy muscles, sore throat, and fevers or chills. Antibiotic medicines are not prescribed for viral infections. This is because antibiotics are designed to kill bacteria. They are not effective against  viruses. This information is not intended to replace advice given to you by your health care provider. Make sure you discuss any questions you have with your health care provider. Document Revised: 04/03/2020 Document Reviewed: 04/03/2020 Elsevier Patient Education  2022 [employer].    If you have been instructed to have an in-person evaluation today at a local Urgent Care facility, please use the link below. It will take you to a list of all of our available [facility], including address, phone number and hours of operation. Please do not delay care.  [facility]  If you or a family member do not have a primary care provider, use the link below to schedule a visit and establish care. When you choose a [facility] primary care physician or advanced practice provider, you gain a long-term partner in health. Find a Primary Care Provider  Learn more about [facility] in-office and virtual care options: [facility]

## 2021-02-18 NOTE — Progress Notes (Signed)
Virtual Visit Consent   Marland Kitchen Luthi, you are scheduled for a virtual visit with a [facility] provider today.     Just as with appointments in the office, your consent must be obtained to participate.  Your consent will be active for this visit and any virtual visit you may have with one of our providers in the next 365 days.     If you have a MyChart account, a copy of this consent can be sent to you electronically.  All virtual visits are billed to your insurance company just like a traditional visit in the office.    As this is a virtual visit, video technology does not allow for your provider to perform a traditional examination.  This may limit your provider's ability to fully assess your condition.  If your provider identifies any concerns that need to be evaluated in person or the need to arrange testing (such as labs, EKG, etc.), we will make arrangements to do so.     Although advances in technology are sophisticated, we cannot ensure that it will always work on either your end or our end.  If the connection with a video visit is poor, the visit may have to be switched to a telephone visit.  With either a video or telephone visit, we are not always able to ensure that we have a secure connection.     I need to obtain your verbal consent now.   Are you willing to proceed with your visit today?    Maelani J Neidlinger has provided verbal consent on 02/18/2021 for a virtual visit (video or telephone).   [name] Daring, PA-C   Date: 02/18/2021 3:29 PM   Virtual Visit via Video Note   IMar Daring, connected with  JAQUANA GEIGER  ([id number], [birth year]) on 02/18/21 at  3:15 PM EST by a video-enabled telemedicine application and verified that I am speaking with the correct person using two identifiers.  Location: Patient: Virtual Visit Location Patient: Home Provider: Virtual Visit Location Provider: Home Office   I discussed the limitations of evaluation and management by  telemedicine and the availability of in person appointments. The patient expressed understanding and agreed to proceed.    History of Present Illness: BRISEIDA GITTINGS is a 46 y.o. who identifies as a female who was assigned female at birth, and is being seen today for URI symptoms.  HPI: URI  This is a new problem. The current episode started in the past 7 days (Monday; had sick contact on Sunday). The problem has been gradually worsening. There has been no fever (having hot flashes). Associated symptoms include congestion, ear pain, headaches, rhinorrhea, sinus pain and sneezing. Pertinent negatives include no sore throat or wheezing. Associated symptoms comments: chills.     Problems:  Patient Active Problem List   Diagnosis Date Noted   Increased risk of breast cancer 10/31/2018   Family history of breast cancer 06/11/2016   Lynch syndrome 04/13/2013   Unspecified vitamin D deficiency 08/24/2012   Major depressive disorder, recurrent, in full remission ([place]) 12/18/2009   ALLERGIC CONJUNCTIVITIS 12/23/2008   DERMATITIS, ATOPIC 12/23/2008   Panic disorder without agoraphobia with panic attacks in partial remission 01/23/2008   GERD 12/05/2007   Allergic rhinitis 12/27/2006   Asthma, moderate persistent, well-controlled 12/27/2006    Allergies:  Allergies  Allergen Reactions   Amoxicillin     Itching (once), no rash Rapid heart rate   Chicken Meat (Diagnostic) Other (See Comments)  Throat feels tight and GERD   Dexilant [Dexlansoprazole] Other (See Comments)    Throat feels tight   Medications:  Current Outpatient Medications:    benzonatate (TESSALON) 100 MG capsule, Take 1 capsule (100 mg total) by mouth 3 (three) times daily as needed., Disp: 30 capsule, Rfl: 0   fluticasone (FLONASE) 50 MCG/ACT nasal spray, Place 2 sprays into both nostrils daily., Disp: 16 g, Rfl: 0   ipratropium (ATROVENT) 0.03 % nasal spray, Place 2 sprays into both nostrils every 12 (twelve) hours.,  Disp: 30 mL, Rfl: 0   albuterol (PROVENTIL) (2.5 MG/3ML) 0.083% nebulizer solution, Take 3 mLs (2.5 mg total) by nebulization every 6 (six) hours as needed for wheezing or shortness of breath., Disp: 150 mL, Rfl: 0   albuterol (VENTOLIN HFA) 108 (90 Base) MCG/ACT inhaler, USE 2 PUFFS EVERY 4 HOURS AS NEEDED FOR WHEEZING, Disp: 18 g, Rfl: 2   escitalopram (LEXAPRO) 20 MG tablet, TAKE 1/2 TABLET BY MOUTH DAILY, Disp: 45 tablet, Rfl: 1   hydrOXYzine (ATARAX/VISTARIL) 10 MG tablet, TAKE 1 TABLET BY MOUTH THREE TIMES A DAY AS NEEDED FOR ANXIETY, Disp: 30 tablet, Rfl: 2   loratadine (CLARITIN) 10 MG tablet, Take 1 tablet (10 mg total) by mouth daily., Disp: 90 tablet, Rfl: 1   mupirocin ointment (BACTROBAN) 2 %, SMARTSIG:1 Application Topical 2-3 Times Daily, Disp: , Rfl:    norethindrone (AYGESTIN) 5 MG tablet, Take 1 tablet (5 mg total) by mouth daily for 7 days., Disp: 7 tablet, Rfl: 0   nystatin (MYCOSTATIN) 100000 UNIT/ML suspension, TAKE 5 MLS (500,000 UNITS TOTAL) BY MOUTH 4 (FOUR) TIMES DAILY., Disp: 473 mL, Rfl: 0   propylthiouracil (PTU) 50 MG tablet, Take by mouth. Once a day., Disp: , Rfl:   Current Facility-Administered Medications:    0.9 %  sodium chloride infusion, 500 mL, Intravenous, Continuous, Pyrtle, Lajuan Lines, MD  Observations/Objective: Patient is well-developed, well-nourished in no acute distress.  Resting comfortably at home.  Head is normocephalic, atraumatic.  No labored breathing.  Speech is clear and coherent with logical content.  Patient is alert and oriented at baseline.    Assessment and Plan: 1. Viral URI - fluticasone (FLONASE) 50 MCG/ACT nasal spray; Place 2 sprays into both nostrils daily.  Dispense: 16 g; Refill: 0 - ipratropium (ATROVENT) 0.03 % nasal spray; Place 2 sprays into both nostrils every 12 (twelve) hours.  Dispense: 30 mL; Refill: 0 - benzonatate (TESSALON) 100 MG capsule; Take 1 capsule (100 mg total) by mouth 3 (three) times daily as needed.   Dispense: 30 capsule; Refill: 0  - Suspect possible Covid 19 or other viral URI - Continue OTC symptomatic management of choice - Add Fluticasone, Ipratropium nasal sprays for congestion and drainage - Tessalon perles for cough - Humidifier and steam treatments with or without echinacea can help - Push fluids - Rest as needed - Seek in person evaluation if not improving or if symptoms worsen  Follow Up Instructions: I discussed the assessment and treatment plan with the patient. The patient was provided an opportunity to ask questions and all were answered. The patient agreed with the plan and demonstrated an understanding of the instructions.  A copy of instructions were sent to the patient via MyChart unless otherwise noted below.    The patient was advised to call back or seek an in-person evaluation if the symptoms worsen or if the condition fails to improve as anticipated.  Time:  I spent 15 minutes with the patient via telehealth  technology discussing the above problems/concerns.    [name] Daring, PA-C

## 2021-02-25 ENCOUNTER — Telehealth: Payer: Self-pay | Admitting: Family Medicine

## 2021-02-26 ENCOUNTER — Other Ambulatory Visit: Payer: Self-pay | Admitting: Family Medicine

## 2021-02-26 ENCOUNTER — Encounter: Payer: Self-pay | Admitting: Family Medicine

## 2021-03-04 ENCOUNTER — Encounter: Payer: Self-pay | Admitting: Family Medicine

## 2021-03-15 ENCOUNTER — Other Ambulatory Visit: Payer: Self-pay | Admitting: Family Medicine

## 2021-03-18 ENCOUNTER — Other Ambulatory Visit: Payer: Self-pay | Admitting: Physician Assistant

## 2021-03-18 DIAGNOSIS — J069 Acute upper respiratory infection, unspecified: Secondary | ICD-10-CM

## 2021-04-02 ENCOUNTER — Other Ambulatory Visit: Payer: Self-pay | Admitting: Obstetrics and Gynecology

## 2021-04-02 DIAGNOSIS — Z1231 Encounter for screening mammogram for malignant neoplasm of breast: Secondary | ICD-10-CM

## 2021-05-08 ENCOUNTER — Ambulatory Visit
Admission: RE | Admit: 2021-05-08 | Discharge: 2021-05-08 | Disposition: A | Discharge status: Home / Self Care | Payer: Managed Care, Other (non HMO) | Source: Ambulatory Visit | Attending: Obstetrics and Gynecology | Admitting: Obstetrics and Gynecology

## 2021-05-08 DIAGNOSIS — Z1231 Encounter for screening mammogram for malignant neoplasm of breast: Secondary | ICD-10-CM | POA: Insufficient documentation

## 2021-05-11 ENCOUNTER — Other Ambulatory Visit: Payer: Self-pay | Admitting: Obstetrics and Gynecology

## 2021-05-11 DIAGNOSIS — N63 Unspecified lump in unspecified breast: Secondary | ICD-10-CM

## 2021-05-11 DIAGNOSIS — R928 Other abnormal and inconclusive findings on diagnostic imaging of breast: Secondary | ICD-10-CM

## 2021-05-12 ENCOUNTER — Ambulatory Visit
Admission: RE | Admit: 2021-05-12 | Discharge: 2021-05-12 | Disposition: A | Discharge status: Home / Self Care | Payer: Managed Care, Other (non HMO) | Source: Ambulatory Visit | Attending: Obstetrics and Gynecology | Admitting: Obstetrics and Gynecology

## 2021-05-12 ENCOUNTER — Encounter: Payer: Self-pay | Admitting: Obstetrics and Gynecology

## 2021-05-12 DIAGNOSIS — N63 Unspecified lump in unspecified breast: Secondary | ICD-10-CM | POA: Insufficient documentation

## 2021-05-12 DIAGNOSIS — R928 Other abnormal and inconclusive findings on diagnostic imaging of breast: Secondary | ICD-10-CM | POA: Diagnosis not present

## 2021-05-27 ENCOUNTER — Encounter: Payer: Self-pay | Admitting: Family Medicine

## 2021-05-27 ENCOUNTER — Other Ambulatory Visit: Payer: Self-pay | Admitting: Family Medicine

## 2021-05-28 ENCOUNTER — Encounter: Payer: Self-pay | Admitting: Family Medicine

## 2021-05-28 ENCOUNTER — Ambulatory Visit (INDEPENDENT_AMBULATORY_CARE_PROVIDER_SITE_OTHER): Payer: Managed Care, Other (non HMO) | Admitting: Family Medicine

## 2021-05-28 VITALS — BP 92/60 | HR 80 | Temp 98.7°F | Ht 65.0 in | Wt 135.0 lb

## 2021-05-28 DIAGNOSIS — J069 Acute upper respiratory infection, unspecified: Secondary | ICD-10-CM | POA: Diagnosis not present

## 2021-05-28 DIAGNOSIS — J029 Acute pharyngitis, unspecified: Secondary | ICD-10-CM

## 2021-05-28 DIAGNOSIS — L0100 Impetigo, unspecified: Secondary | ICD-10-CM | POA: Diagnosis not present

## 2021-05-28 LAB — POCT RAPID STREP A (OFFICE): Rapid Strep A Screen: NEGATIVE

## 2021-05-28 LAB — POC COVID19 BINAXNOW: SARS Coronavirus 2 Ag: NEGATIVE

## 2021-05-28 MED ORDER — MUPIROCIN 2 % EX OINT: TOPICAL_OINTMENT | CUTANEOUS | 3 refills | Status: DC

## 2021-05-28 NOTE — Progress Notes (Signed)
Gloria Rush T. Gloria Nakatani, MD, [facility] [place], [address]  Phone: [phone]  FAX: [place] - 46 y.o. female  MRN [id number]  Date of Birth: 14-Nov-1975  Date: 05/28/2021  PCP: Owens Loffler, MD  Referral: Owens Loffler, MD  Chief Complaint  Patient presents with   Nasal Congestion   Sore Throat   Subjective:   This 46 y.o. female patient presents with runny nose, sneezing, cough, sore throat, malaise and minimal / low-grade fever .   ? recent exposure to others with similar symptoms. Son with strep 2 weeks ago.  ST with nasal congestion. Denies sthortness of breath/wheezing, high fever, chest pain, rhinits for more than 14 days, significant myalgia, otalgia, facial pain, abdominal pain, changes in bowel or bladder.   Review of Systems is noted in the HPI, as appropriate  Objective:   Blood pressure 92/60, pulse 80, temperature 98.7 F (37.1 C), temperature source Oral, height '5\' 5"'$  (1.651 m), weight 135 lb (61.2 kg), last menstrual period 05/08/2021, SpO2 99 %.   Gen: WDWN, NAD. Globally Non-toxic HEENT: Throat clear, w/o exudate, R TM clear, L TM - good landmarks, No fluid present. rhinnorhea.  MMM R nares with impetigo/vesicles Frontal sinuses: NT Max sinuses: NT NECK: Anterior cervical  LAD is absent CV: RRR, No M/G/R, cap refill <2 sec PULM: Breathing comfortably in no respiratory distress. no wheezing, crackles, rhonchi   Objective Data: Results for orders placed or performed in visit on 05/28/21  POCT rapid strep A  Result Value Ref Range   Rapid Strep A Screen Negative Negative  POC COVID-19  Result Value Ref Range   SARS Coronavirus 2 Ag Negative Negative    Assessment and Plan:     ICD-10-CM   1. Viral URI  J06.9     2. Sore throat  J02.9 POCT rapid strep A    POC COVID-19    3. Impetigo  L01.00      Supportive care reviewed with patient.  See patient instruction section.  Bactroban for nasal impetigo  Follow-up: No follow-ups on file.  Meds ordered this encounter  Medications   mupirocin ointment (BACTROBAN) 2 %    Sig: SMARTSIG:1 Application Topical 2-3 Times Daily    Dispense:  22 g    Refill:  3   Orders Placed This Encounter  Procedures   POCT rapid strep A   POC COVID-19    Signed,  Gerrianne Aydelott T. Toby Ayad, MD   Patient's Medications  New Prescriptions   No medications on file  Previous Medications   ALBUTEROL (PROVENTIL) (2.5 MG/3ML) 0.083% NEBULIZER SOLUTION    Take 3 mLs (2.5 mg total) by nebulization every 6 (six) hours as needed for wheezing or shortness of breath.   ALBUTEROL (VENTOLIN HFA) 108 (90 BASE) MCG/ACT INHALER    USE 2 PUFFS EVERY 4 HOURS AS NEEDED FOR WHEEZING   ESCITALOPRAM (LEXAPRO) 20 MG TABLET    TAKE 1/2 TABLET BY MOUTH DAILY   FLUTICASONE (FLONASE) 50 MCG/ACT NASAL SPRAY    SPRAY 2 SPRAYS INTO EACH NOSTRIL EVERY DAY   HYDROXYZINE (ATARAX) 10 MG TABLET    TAKE 1 TABLET BY MOUTH THREE TIMES A DAY AS NEEDED FOR ANXIETY   LORATADINE (CLARITIN) 10 MG TABLET    Take 1 tablet (10 mg total) by mouth daily.   NYSTATIN (MYCOSTATIN) 100000 UNIT/ML SUSPENSION    TAKE 5 MLS (500,000 UNITS TOTAL) BY MOUTH 4 (  FOUR) TIMES DAILY.   PROPYLTHIOURACIL (PTU) 50 MG TABLET    Take by mouth. Once a day.  Modified Medications   Modified Medication Previous Medication   MUPIROCIN OINTMENT (BACTROBAN) 2 % mupirocin ointment (BACTROBAN) 2 %      SMARTSIG:1 Application Topical 2-3 Times Daily    SMARTSIG:1 Application Topical 2-3 Times Daily  Discontinued Medications   BENZONATATE (TESSALON) 100 MG CAPSULE    Take 1 capsule (100 mg total) by mouth 3 (three) times daily as needed.   IPRATROPIUM (ATROVENT) 0.03 % NASAL SPRAY    Place 2 sprays into both nostrils every 12 (twelve) hours.   NORETHINDRONE (AYGESTIN) 5 MG TABLET    Take 1 tablet (5 mg total) by mouth daily for 7 days.

## 2021-05-31 NOTE — Progress Notes (Unsigned)
 Chief Complaint  Patient presents with   Gynecologic Exam    No concerns     HPI:      [name] is a 46 y.o. G0P0000 who LMP was Patient's last menstrual period was 05/08/2021 (exact date)., presents today for her annual examination.  Her menses are regular every 28-30 days, lasting 6-7 days, mod flow now, no BTB, mild dysmen. Used to be heavier but improved now.  Hx of Anemia with low ferritin levels on 7/20 labs; improved 7/21 and 7/22 labs with PCP. Taking OTC iron supp. Tried lysteda for menorrhagia in past with good sx relief, but pt has asthma and caused chest tightness for her. Has tried a couple different OCPs for cycle control (and ovar cancer prevention) but increases pt's anxiety. Hx of 4 cm leio on 12/21 GYN u/s. Also with hx of ovar cyst in past, resolved on 10/20 GYN u/s.   Hx of thyroid disorder, controlled now. Feels menses are improved with this. Was seeing endocrine. Anxiety improved with thyroid control.   Sex activity: single partner, contraception - none. Hx of infertility. No pain/bleeding. Declines BC.  Last Pap: 12/11/19 Results were: no abnormalities /neg HPV DNA   Last mammogram: 05/12/21  Results were: normal after addl imaging due to RT breast cyst--routine follow-up in 12 months.  There is a FH of breast cancer in her MGM and mat aunt. There is no FH of ovarian cancer. The patient does do self-breast exams. The patient is BRCA neg but Lynch positive (PMS2). IBIS=20.1%  2014. Has not had screening breast MRI. No FH Lynch syndrome cancers.   Tobacco use: The patient denies current or previous tobacco use. Alcohol use: none  No drug use.  Exercise: mod active  She does get adequate calcium and Vitamin D in her diet. Labs with PCP.  She is Lynch pos (PMS2).   Per 2023 NCCN guidelines,   Endometrial cancer:  Yearly EMB (neg 11/21); declined today, wants to do after GYN u/s f/u appt.  Yearly u/s (done 12/21); sched with EMB  Hyst recommended. Pt  aware and not interested currently.   Ovarian cancer: Recommend BSO age 40 or after childbearing; pt not interested currently.    Suggest yearly GYN u/s (done 12/21) and ca-125 (neg 7/21); will RTO for GYN u/s, ca-125 today.   OCPs if pt doesn't want BSO--tried in past but can't tolerate side effects of increased anxiety.  Small bowel/Colon cancer/Gastric: Upper GI Q3-5 yrs, Colonoscopy Q1-2 yrs. Pt had done with Dr. Pyrtle 3/18 , thought she should wait to age 47 for insurance to cover but told her that age was for low risk pts. She will check with insurance re: coverage.  Her mom's colonoscopy was neg recently  Hepatobiliary cancers: no med mgmt guidelines  Pancreatic cancers:no FH; no screening options available  Urothelial: check UA yearly   Past Medical History:  Diagnosis Date   Anxiety    Asthma    Family history of breast cancer    Fibroid    Genetic testing of female    MyRisk Positive   Increased risk of breast cancer 2014, 2018   riskscore=22.2% 6/18   Infertility, female    Lynch syndrome    PMS2 pos 11/14, ATM VUS   Panic disorder     Past Surgical History:  Procedure Laterality Date   WISDOM TOOTH EXTRACTION      Family History  Problem Relation Age of Onset   Anxiety disorder Mother      Anxiety disorder Brother    Diabetes Maternal Grandfather    Breast cancer Maternal Grandmother 40   Breast cancer Other 70   Colon cancer Neg Hx    Stomach cancer Neg Hx     Social History   Socioeconomic History   Marital status: Married    Spouse name: Not on file   Number of children: Not on file   Years of education: Not on file   Highest education level: Not on file  Occupational History   Not on file  Tobacco Use   Smoking status: Never   Smokeless tobacco: Never  Vaping Use   Vaping Use: Never used  Substance and Sexual Activity   Alcohol use: No    Alcohol/week: 0.0 standard drinks   Drug use: No   Sexual activity: Yes    Partners: Male     Birth control/protection: None  Other Topics Concern   Not on file  Social History Narrative   Not on file   Social Determinants of Health   Financial Resource Strain: Not on file  Food Insecurity: Not on file  Transportation Needs: Not on file  Physical Activity: Not on file  Stress: Not on file  Social Connections: Not on file  Intimate Partner Violence: Not on file     Current Outpatient Medications:    albuterol (PROVENTIL) (2.5 MG/3ML) 0.083% nebulizer solution, Take 3 mLs (2.5 mg total) by nebulization every 6 (six) hours as needed for wheezing or shortness of breath., Disp: 150 mL, Rfl: 0   albuterol (VENTOLIN HFA) 108 (90 Base) MCG/ACT inhaler, USE 2 PUFFS EVERY 4 HOURS AS NEEDED FOR WHEEZING, Disp: 18 g, Rfl: 2   escitalopram (LEXAPRO) 20 MG tablet, TAKE 1/2 TABLET BY MOUTH DAILY, Disp: 45 tablet, Rfl: 1   hydrOXYzine (ATARAX) 10 MG tablet, TAKE 1 TABLET BY MOUTH THREE TIMES A DAY AS NEEDED FOR ANXIETY, Disp: 30 tablet, Rfl: 2   loratadine (CLARITIN) 10 MG tablet, Take 1 tablet (10 mg total) by mouth daily., Disp: 90 tablet, Rfl: 1   mupirocin ointment (BACTROBAN) 2 %, SMARTSIG:1 Application Topical 2-3 Times Daily, Disp: 22 g, Rfl: 3   nystatin (MYCOSTATIN) 100000 UNIT/ML suspension, TAKE 5 MLS (500,000 UNITS TOTAL) BY MOUTH 4 (FOUR) TIMES DAILY., Disp: 473 mL, Rfl: 0   propylthiouracil (PTU) 50 MG tablet, Take by mouth. Once a day., Disp: , Rfl:   Current Facility-Administered Medications:    0.9 %  sodium chloride infusion, 500 mL, Intravenous, Continuous, Pyrtle, Jay M, MD  ROS:  Review of Systems  Constitutional:  Negative for fatigue, fever and unexpected weight change.  Respiratory:  Negative for cough, shortness of breath and wheezing.   Cardiovascular:  Negative for chest pain, palpitations and leg swelling.  Gastrointestinal:  Negative for blood in stool, constipation, diarrhea, nausea and vomiting.  Endocrine: Negative for cold intolerance, heat intolerance  and polyuria.  Genitourinary:  Negative for dyspareunia, dysuria, flank pain, frequency, genital sores, hematuria, menstrual problem, pelvic pain, urgency, vaginal bleeding, vaginal discharge and vaginal pain.  Musculoskeletal:  Negative for back pain, joint swelling and myalgias.  Skin:  Negative for rash.  Neurological:  Negative for dizziness, syncope, light-headedness, numbness and headaches.  Hematological:  Negative for adenopathy.  Psychiatric/Behavioral:  Negative for agitation, confusion, sleep disturbance and suicidal ideas. The patient is not nervous/anxious.     Objective: BP 118/60   Ht 5' 5" (1.651 m)   Wt 136 lb (61.7 kg)   LMP 05/08/2021 (Exact Date)     BMI 22.63 kg/m    Physical Exam Constitutional:      Appearance: She is well-developed.  Genitourinary:     Vulva normal.     Right Labia: No rash, tenderness or lesions.    Left Labia: No tenderness, lesions or rash.    No vaginal discharge, erythema or tenderness.      Right Adnexa: not tender and no mass present.    Left Adnexa: not tender and no mass present.    No cervical motion tenderness, friability or polyp.     Uterus is not enlarged or tender.  Breasts:    Right: No mass, nipple discharge, skin change or tenderness.     Left: No mass, nipple discharge, skin change or tenderness.  Neck:     Thyroid: No thyromegaly.  Cardiovascular:     Rate and Rhythm: Normal rate and regular rhythm.     Heart sounds: Normal heart sounds. No murmur heard. Pulmonary:     Effort: Pulmonary effort is normal.     Breath sounds: Normal breath sounds.  Abdominal:     Palpations: Abdomen is soft.     Tenderness: There is no abdominal tenderness. There is no guarding or rebound.  Musculoskeletal:        General: Normal range of motion.     Cervical back: Normal range of motion.  Lymphadenopathy:     Cervical: No cervical adenopathy.  Neurological:     General: No focal deficit present.     Mental Status: She is  alert and oriented to person, place, and time.     Cranial Nerves: No cranial nerve deficit.  Skin:    General: Skin is warm and dry.  Psychiatric:        Mood and Affect: Mood normal.        Behavior: Behavior normal.        Thought Content: Thought content normal.        Judgment: Judgment normal.  Vitals reviewed.   Assessment/Plan: Encounter for annual routine gynecological examination  Encounter for screening mammogram for malignant neoplasm of breast  Family history of breast cancer--Pt is BRCA neg.  Increased risk of breast cancer--IBIS=20.1%. Discussed monthly SBE, yearly CBE and mammos, as well as scr breast MRI. Pt to consider MRI and f/u if wants 5 months after mammo. Cont Vit D supp.   Lynch syndrome - Plan: CA 125, US PELVIS TRANSVAGINAL NON-OB (TV ONLY); pt to RTO for GYN u/s, EMB (declined today). Ca-125 today; recommended ref to GI, pt to check insurance first and f/u for ref prn. Pt aware of PMS2 recommendations in HPI.              GYN counsel breast self exam, mammography screening, adequate intake of calcium and vitamin D, diet and exercise     F/U  Return in about 1 year (around 06/02/2022), or GYN u/s at [facility] wtih EMB with [facility] after u/s, same day../ 1 yr annual  [place] B. Yalissa Fink, PA-C 06/02/2021 12:01 PM

## 2021-06-01 ENCOUNTER — Encounter: Payer: Self-pay | Admitting: Obstetrics and Gynecology

## 2021-06-01 ENCOUNTER — Ambulatory Visit (INDEPENDENT_AMBULATORY_CARE_PROVIDER_SITE_OTHER): Payer: Managed Care, Other (non HMO) | Admitting: Obstetrics and Gynecology

## 2021-06-01 VITALS — BP 118/60 | Ht 65.0 in | Wt 136.0 lb

## 2021-06-01 DIAGNOSIS — Z1509 Genetic susceptibility to other malignant neoplasm: Secondary | ICD-10-CM

## 2021-06-01 DIAGNOSIS — Z9189 Other specified personal risk factors, not elsewhere classified: Secondary | ICD-10-CM

## 2021-06-01 DIAGNOSIS — Z1231 Encounter for screening mammogram for malignant neoplasm of breast: Secondary | ICD-10-CM

## 2021-06-01 DIAGNOSIS — Z01419 Encounter for gynecological examination (general) (routine) without abnormal findings: Secondary | ICD-10-CM | POA: Diagnosis not present

## 2021-06-01 DIAGNOSIS — Z1211 Encounter for screening for malignant neoplasm of colon: Secondary | ICD-10-CM

## 2021-06-01 DIAGNOSIS — Z803 Family history of malignant neoplasm of breast: Secondary | ICD-10-CM

## 2021-06-01 NOTE — Patient Instructions (Signed)
I value your feedback and you entrusting us with your care. If you get a [facility] patient survey, I would appreciate you taking the time to let us know about your experience today. Thank you! ? ? ?

## 2021-06-02 LAB — CA 125: Cancer Antigen (CA) 125: 11.5 U/mL (ref 0.0–38.1)

## 2021-06-15 ENCOUNTER — Encounter: Payer: Self-pay | Admitting: Obstetrics and Gynecology

## 2021-06-22 NOTE — Telephone Encounter (Signed)
Patient is scheduled 07/02/21 for ultrasound and follow up with [facility]

## 2021-06-30 NOTE — Telephone Encounter (Signed)
per pt can't make it  for 07/02/21 for scheduled ultrasound and follow up. Patient states she will call back to rescheduled.

## 2021-07-01 ENCOUNTER — Other Ambulatory Visit: Payer: Self-pay | Admitting: *Deleted

## 2021-07-01 NOTE — Telephone Encounter (Signed)
PLEASE NOTE: All timestamps contained within this report are represented as [place] Standard Time. CONFIDENTIALTY NOTICE: This fax transmission is intended only for the addressee. It contains information that is legally privileged, confidential or otherwise protected from use or disclosure. If you are not the intended recipient, you are strictly prohibited from reviewing, disclosing, copying using or disseminating any of this information or taking any action in reliance on or regarding this information. If you have received this fax in error, please notify us immediately by telephone so that we can arrange for its return to [place]. Phone: [phone], Toll-Free: [phone], Fax: [phone] Page: 1 of 2 Call Id: [id number] [facility] REC[facility] AccessNurse Patient Name: Gloria Rush Gender: Female DOB: [birth year] Age: 46 Y 9 M Return Phone Number: [phone] (Primary) Address: [address] City/ State/ Zip: [place] [address] Client [facility] Night - Client Client Site [facility] Provider Owens Loffler - MD Contact Type Call Who Is Calling Patient / Member / Family / Caregiver Call Type Triage / Clinical Relationship To Patient Self Return Phone Number [phone] (Primary) Chief Complaint Anxiety and Panic Attack Reason for Call Symptomatic / Request for [place] states she needs her Hydroxine sent to her pharmacy. She is having an anxiety attack. Translation No Nurse Assessment Nurse: Hendricks Limes, RN, Kennyth Arnold Date/Time (Eastern Time): 06/30/2021 9:33:32 PM Confirm and document reason for call. If symptomatic, describe symptoms. ---Caller states she needs refill of hydroxyzine. Caller reports her normal [facility] is closed now. Caller reports she is anxious now. Caller reports no other symptoms. Does the patient  have any new or worsening symptoms? ---Yes Will a triage be completed? ---Yes Related visit to physician within the last 2 weeks? ---N/A Does the PT have any chronic conditions? (i.e. diabetes, asthma, this includes High risk factors for pregnancy, etc.) ---Unknown Is the patient pregnant or possibly pregnant? (Ask all females between the ages of 99-55) ---No Is this a behavioral health or substance abuse call? ---No Guidelines Guideline Title Affirmed Question Affirmed Notes Nurse Date/Time ([place] Time) Disp. Time Eilene Ghazi Time) Disposition Final User 06/30/2021 9:38:01 PM Clinical Call Yes Hendricks Limes, RN, Kennyth Arnold PLEASE NOTE: All timestamps contained within this report are represented as [place] Standard Time. CONFIDENTIALTY NOTICE: This fax transmission is intended only for the addressee. It contains information that is legally privileged, confidential or otherwise protected from use or disclosure. If you are not the intended recipient, you are strictly prohibited from reviewing, disclosing, copying using or disseminating any of this information or taking any action in reliance on or regarding this information. If you have received this fax in error, please notify us immediately by telephone so that we can arrange for its return to [place]. Phone: [phone], Toll-Free: [phone], Fax: [phone] Page: 2 of 2 Call Id: [id number] Comments User: Cristopher Peru, RN Date/Time Eilene Ghazi Time): 06/30/2021 9:37:53 PM Caller stated during triage that Dr. Lorelei Pont just called her back and will send rx for her. Caller disconnected the line.

## 2021-07-01 NOTE — Telephone Encounter (Signed)
See  access nurse call.

## 2021-07-02 ENCOUNTER — Ambulatory Visit: Payer: Managed Care, Other (non HMO)

## 2021-07-02 ENCOUNTER — Ambulatory Visit: Payer: Managed Care, Other (non HMO) | Admitting: Obstetrics and Gynecology

## 2021-07-02 ENCOUNTER — Encounter: Payer: Self-pay | Admitting: Obstetrics and Gynecology

## 2021-07-02 DIAGNOSIS — Z1509 Genetic susceptibility to other malignant neoplasm: Secondary | ICD-10-CM

## 2021-07-03 MED ORDER — HYDROXYZINE HCL 10 MG PO TABS: 10.0000 mg | ORAL_TABLET | Freq: Four times a day (QID) | ORAL | 2 refills | Status: DC | PRN

## 2021-07-26 ENCOUNTER — Encounter: Payer: Self-pay | Admitting: Family Medicine

## 2021-07-27 ENCOUNTER — Other Ambulatory Visit (INDEPENDENT_AMBULATORY_CARE_PROVIDER_SITE_OTHER): Payer: Managed Care, Other (non HMO)

## 2021-07-27 DIAGNOSIS — Z131 Encounter for screening for diabetes mellitus: Secondary | ICD-10-CM

## 2021-07-27 DIAGNOSIS — Z13220 Encounter for screening for lipoid disorders: Secondary | ICD-10-CM | POA: Diagnosis not present

## 2021-07-27 DIAGNOSIS — D509 Iron deficiency anemia, unspecified: Secondary | ICD-10-CM

## 2021-07-27 DIAGNOSIS — R7989 Other specified abnormal findings of blood chemistry: Secondary | ICD-10-CM

## 2021-07-27 DIAGNOSIS — Z79899 Other long term (current) drug therapy: Secondary | ICD-10-CM

## 2021-07-27 DIAGNOSIS — E0590 Thyrotoxicosis, unspecified without thyrotoxic crisis or storm: Secondary | ICD-10-CM | POA: Diagnosis not present

## 2021-07-27 LAB — BASIC METABOLIC PANEL
BUN: 11 mg/dL (ref 6–23)
CO2: 24 mEq/L (ref 19–32)
Calcium: 8.9 mg/dL (ref 8.4–10.5)
Chloride: 107 mEq/L (ref 96–112)
Creatinine, Ser: 0.78 mg/dL (ref 0.40–1.20)
GFR: 92.44 mL/min (ref >60.00)
Glucose, Bld: 88 mg/dL (ref 70–99)
Potassium: 3.9 mEq/L (ref 3.5–5.1)
Sodium: 140 mEq/L (ref 135–145)

## 2021-07-27 LAB — CBC WITH DIFFERENTIAL/PLATELET
Basophils Absolute: 0.1 10*3/uL (ref 0.0–0.1)
Basophils Relative: 1.1 % (ref 0.0–3.0)
Eosinophils Absolute: 0.4 10*3/uL (ref 0.0–0.7)
Eosinophils Relative: 6.6 % — ABNORMAL HIGH (ref 0.0–5.0)
HCT: 30.6 % — ABNORMAL LOW (ref 36.0–46.0)
Hemoglobin: 9.2 g/dL — ABNORMAL LOW (ref 12.0–15.0)
Lymphocytes Relative: 22.2 % (ref 12.0–46.0)
Lymphs Abs: 1.3 10*3/uL (ref 0.7–4.0)
MCHC: 30.1 g/dL (ref 30.0–36.0)
MCV: 76 fl — ABNORMAL LOW (ref 78.0–100.0)
Monocytes Absolute: 0.4 10*3/uL (ref 0.1–1.0)
Monocytes Relative: 7.6 % (ref 3.0–12.0)
Neutro Abs: 3.5 10*3/uL (ref 1.4–7.7)
Neutrophils Relative %: 62.5 % (ref 43.0–77.0)
Platelets: 146 10*3/uL — ABNORMAL LOW (ref 150.0–400.0)
RBC: 4.03 Mil/uL (ref 3.87–5.11)
RDW: 19.5 % — ABNORMAL HIGH (ref 11.5–15.5)
WBC: 5.6 10*3/uL (ref 4.0–10.5)

## 2021-07-27 LAB — LIPID PANEL
Cholesterol: 158 mg/dL (ref 0–200)
HDL: 63.8 mg/dL (ref >39.00)
LDL Cholesterol: 82 mg/dL (ref 0–99)
NonHDL: 94.25
Total CHOL/HDL Ratio: 2
Triglycerides: 63 mg/dL (ref 0.0–149.0)
VLDL: 12.6 mg/dL (ref 0.0–40.0)

## 2021-07-27 LAB — VITAMIN D 25 HYDROXY (VIT D DEFICIENCY, FRACTURES): VITD: 34.52 ng/mL (ref 30.00–100.00)

## 2021-07-27 LAB — T4, FREE: Free T4: 0.5 ng/dL — ABNORMAL LOW (ref 0.60–1.60)

## 2021-07-27 LAB — HEPATIC FUNCTION PANEL
ALT: 8 U/L (ref 0–35)
AST: 12 U/L (ref 0–37)
Albumin: 4.2 g/dL (ref 3.5–5.2)
Alkaline Phosphatase: 63 U/L (ref 39–117)
Bilirubin, Direct: 0.1 mg/dL (ref 0.0–0.3)
Total Bilirubin: 0.3 mg/dL (ref 0.2–1.2)
Total Protein: 6.7 g/dL (ref 6.0–8.3)

## 2021-07-27 LAB — HEMOGLOBIN A1C: Hgb A1c MFr Bld: 5.4 % (ref 4.6–6.5)

## 2021-07-27 LAB — IBC + FERRITIN
Ferritin: 4.7 ng/mL — ABNORMAL LOW (ref 10.0–291.0)
Iron: 12 ug/dL — ABNORMAL LOW (ref 42–145)
Saturation Ratios: 2.7 % — ABNORMAL LOW (ref 20.0–50.0)
TIBC: 445.2 ug/dL (ref 250.0–450.0)
Transferrin: 318 mg/dL (ref 212.0–360.0)

## 2021-07-27 LAB — TSH: TSH: 1.63 u[IU]/mL (ref 0.35–5.50)

## 2021-07-27 LAB — T3, FREE: T3, Free: 3.4 pg/mL (ref 2.3–4.2)

## 2021-07-30 ENCOUNTER — Encounter: Payer: Self-pay | Admitting: Family Medicine

## 2021-08-03 ENCOUNTER — Encounter: Payer: Self-pay | Admitting: Family Medicine

## 2021-08-03 ENCOUNTER — Ambulatory Visit (INDEPENDENT_AMBULATORY_CARE_PROVIDER_SITE_OTHER): Payer: Managed Care, Other (non HMO) | Admitting: Family Medicine

## 2021-08-03 VITALS — BP 110/64 | HR 82 | Temp 98.2°F | Ht 64.75 in | Wt 134.0 lb

## 2021-08-03 DIAGNOSIS — Z0000 Encounter for general adult medical examination without abnormal findings: Secondary | ICD-10-CM | POA: Diagnosis not present

## 2021-08-03 DIAGNOSIS — Z23 Encounter for immunization: Secondary | ICD-10-CM | POA: Diagnosis not present

## 2021-08-03 NOTE — Progress Notes (Signed)
Avenell Sellers T. Kalon Erhardt, MD, [place] at [facility] [place], [address]  Phone: [phone]  FAX: [place] - 46 y.o. female  MRN [id number]  Date of Birth: Jun 03, 1975  Date: 08/03/2021  PCP: Owens Loffler, MD  Referral: Owens Loffler, MD  Chief Complaint  Patient presents with   Annual Exam   Patient Care Team: Owens Loffler, MD as PCP - General Subjective:   Gloria Rush is a 46 y.o. pleasant patient who presents with the following:  Health Maintenance Summary Reviewed and updated, unless pt declines services.  Tobacco History Reviewed. Non-smoker Alcohol: No concerns, no excessive use Exercise Habits: Some activity, rec at least 30 mins 5 times a week STD concerns: none Drug Use: None Lumps or breast concerns: no  Colon Covid booster Tdap will be due in September  Globally she is doing well.  She is feeling good, her breathing is good.  She has only had 1 panic attack in about the last year.  She very compliant taking her Lexapro. Right now she is not taking any kind of maintenance inhalers, she is still doing well.  She feels as if the hydroxyzine is also helping with her breathing.  CBC:    Component Value Date/Time   WBC 5.6 07/27/2021 1000   HGB 9.2 (L) 07/27/2021 1000   HGB 12.4 12/20/2019 1432   HCT 30.6 (L) 07/27/2021 1000   HCT 39.1 12/20/2019 1432   PLT 146.0 (L) 07/27/2021 1000   PLT 206 12/20/2019 1432   MCV 76.0 (L) 07/27/2021 1000   MCV 89 12/20/2019 1432   MCV 89 12/27/2013 1949   NEUTROABS 3.5 07/27/2021 1000   NEUTROABS 4.3 12/20/2019 1432   LYMPHSABS 1.3 07/27/2021 1000   LYMPHSABS 1.1 12/20/2019 1432   MONOABS 0.4 07/27/2021 1000   EOSABS 0.4 07/27/2021 1000   EOSABS 0.4 12/20/2019 1432   BASOSABS 0.1 07/27/2021 1000   BASOSABS 0.1 12/20/2019 1432    Iron/TIBC/Ferritin/ %Sat    Component Value Date/Time   IRON 12 (L) 07/27/2021 1000   IRON  23 (L) 12/20/2019 1432   TIBC 445.2 07/27/2021 1000   TIBC 314 12/20/2019 1432   FERRITIN 4.7 (L) 07/27/2021 1000   FERRITIN 18 12/20/2019 1432   IRONPCTSAT 2.7 (L) 07/27/2021 1000   IRONPCTSAT 7 (LL) 12/20/2019 1432    Heavy menses, longstanding history of iron deficiency anemia.  She has not been taking her iron recently.  Health Maintenance  Topic Date Due   COLONOSCOPY (Pts 45-71yr Insurance coverage will need to be confirmed)  03/30/2017   COVID-19 Vaccine (3 - Pfizer risk series) 10/08/2019   INFLUENZA VACCINE  08/11/2021   PAP SMEAR-Modifier  12/10/2024   TETANUS/TDAP  08/04/2031   Hepatitis C Screening  Completed   HIV Screening  Completed   HPV VACCINES  Aged Out    Immunization History  Administered Date(s) Administered   Influenza Split 12/24/2010   Influenza Whole 12/27/2006, 10/10/2007, 11/19/2009   Influenza, Quadrivalent, Recombinant, Inj, Pf 11/19/2015, 11/26/2016   Influenza,inj,Quad PF,6+ Mos 09/29/2018   Influenza,trivalent, recombinat, inj, PF 12/21/2013   Influenza-Unspecified 11/25/2012, 01/29/2015   PFIZER(Purple Top)SARS-COV-2 Vaccination 08/13/2019, 09/10/2019   PNEUMOCOCCAL CONJUGATE-20 07/30/2020   PPD Test 07/14/2010   Tdap 10/11/2011, 08/03/2021   Patient Active Problem List   Diagnosis Date Noted   Lynch syndrome 04/13/2013    Priority: High   Asthma, moderate persistent, well-controlled 12/27/2006    Priority: High  Major depressive disorder, recurrent, in full remission ([place]) 12/18/2009    Priority: Medium    Panic disorder without agoraphobia with panic attacks in partial remission 01/23/2008    Priority: Medium    Increased risk of breast cancer 10/31/2018   Family history of breast cancer 06/11/2016   Unspecified vitamin D deficiency 08/24/2012   ALLERGIC CONJUNCTIVITIS 12/23/2008   DERMATITIS, ATOPIC 12/23/2008   GERD 12/05/2007   Allergic rhinitis 12/27/2006    Past Medical History:  Diagnosis Date   Anxiety    Asthma     Family history of breast cancer    Fibroid    Genetic testing of female    MyRisk Positive   Increased risk of breast cancer 2014, 2018   riskscore=22.2% 6/18   Lynch syndrome    PMS2 pos 11/14, ATM VUS   Panic disorder     Past Surgical History:  Procedure Laterality Date   WISDOM TOOTH EXTRACTION      Family History  Problem Relation Age of Onset   Anxiety disorder Mother    Anxiety disorder Brother    Diabetes Maternal Grandfather    Breast cancer Maternal Grandmother 40   Breast cancer Other 43   Colon cancer Neg Hx    Stomach cancer Neg Hx     Social History   Social History Narrative   Not on file    Past Medical History, Surgical History, Social History, Family History, Problem List, Medications, and Allergies have been reviewed and updated if relevant.  Review of Systems: Pertinent positives are listed above.  Otherwise, a full 14 point review of systems has been done in full and it is negative except where it is noted positive.  Objective:   BP 110/64   Pulse 82   Temp 98.2 F (36.8 C) (Oral)   Ht 5' 4.75" (1.645 m)   Wt 134 lb (60.8 kg)   LMP 08/03/2021   SpO2 98%   BMI 22.47 kg/m  Ideal Body Weight: Weight in (lb) to have BMI = 25: 148.8 No results found.    08/03/2021    9:07 AM 08/07/2018    2:57 PM  Depression screen PHQ 2/9  Decreased Interest 0 0  Down, Depressed, Hopeless 0 0  PHQ - 2 Score 0 0     GEN: well developed, well nourished, no acute distress Eyes: conjunctiva and lids normal, PERRLA, EOMI ENT: TM clear, nares clear, oral exam WNL Neck: supple, no lymphadenopathy, no thyromegaly, no JVD Pulm: clear to auscultation and percussion, respiratory effort normal CV: regular rate and rhythm, S1-S2, no murmur, rub or gallop, no bruits Chest: no scars, masses, no lumps BREAST: breast exam declined GI: soft, non-tender; no hepatosplenomegaly, masses; active bowel sounds all quadrants GU: GU exam declined Lymph: no cervical,  axillary or inguinal adenopathy MSK: gait normal, muscle tone and strength WNL, no joint swelling, effusions, discoloration, crepitus  SKIN: clear, good turgor, color WNL, no rashes, lesions, or ulcerations Neuro: normal mental status, normal strength, sensation, and motion Psych: alert; oriented to person, place and time, normally interactive and not anxious or depressed in appearance.   All labs reviewed with patient. Results for orders placed or performed in visit on 07/27/21  Lipid panel  Result Value Ref Range   Cholesterol 158 0 - 200 mg/dL   Triglycerides 63.0 0.0 - 149.0 mg/dL   HDL 63.80 >39.00 mg/dL   VLDL 12.6 0.0 - 40.0 mg/dL   LDL Cholesterol 82 0 - 99 mg/dL   Total  CHOL/HDL Ratio 2    NonHDL 94.25   Hemoglobin A1c  Result Value Ref Range   Hgb A1c MFr Bld 5.4 4.6 - 6.5 %  CBC with Differential/Platelet  Result Value Ref Range   WBC 5.6 4.0 - 10.5 K/uL   RBC 4.03 3.87 - 5.11 Mil/uL   Hemoglobin 9.2 (L) 12.0 - 15.0 g/dL   HCT 30.6 (L) 36.0 - 46.0 %   MCV 76.0 (L) 78.0 - 100.0 fl   MCHC 30.1 30.0 - 36.0 g/dL   RDW 19.5 (H) 11.5 - 15.5 %   Platelets 146.0 (L) 150.0 - 400.0 K/uL   Neutrophils Relative % 62.5 43.0 - 77.0 %   Lymphocytes Relative 22.2 12.0 - 46.0 %   Monocytes Relative 7.6 3.0 - 12.0 %   Eosinophils Relative 6.6 (H) 0.0 - 5.0 %   Basophils Relative 1.1 0.0 - 3.0 %   Neutro Abs 3.5 1.4 - 7.7 K/uL   Lymphs Abs 1.3 0.7 - 4.0 K/uL   Monocytes Absolute 0.4 0.1 - 1.0 K/uL   Eosinophils Absolute 0.4 0.0 - 0.7 K/uL   Basophils Absolute 0.1 0.0 - 0.1 K/uL  Hepatic function panel  Result Value Ref Range   Total Bilirubin 0.3 0.2 - 1.2 mg/dL   Bilirubin, Direct 0.1 0.0 - 0.3 mg/dL   Alkaline Phosphatase 63 39 - 117 U/L   AST 12 0 - 37 U/L   ALT 8 0 - 35 U/L   Total Protein 6.7 6.0 - 8.3 g/dL   Albumin 4.2 3.5 - 5.2 g/dL  Basic metabolic panel  Result Value Ref Range   Sodium 140 135 - 145 mEq/L   Potassium 3.9 3.5 - 5.1 mEq/L   Chloride 107 96 - 112  mEq/L   CO2 24 19 - 32 mEq/L   Glucose, Bld 88 70 - 99 mg/dL   BUN 11 6 - 23 mg/dL   Creatinine, Ser 0.78 0.40 - 1.20 mg/dL   GFR 92.44 >60.00 mL/min   Calcium 8.9 8.4 - 10.5 mg/dL  TSH  Result Value Ref Range   TSH 1.63 0.35 - 5.50 uIU/mL  T3, free  Result Value Ref Range   T3, Free 3.4 2.3 - 4.2 pg/mL  T4, free  Result Value Ref Range   Free T4 0.50 (L) 0.60 - 1.60 ng/dL  IBC + Ferritin  Result Value Ref Range   Iron 12 (L) 42 - 145 ug/dL   Transferrin 318.0 212.0 - 360.0 mg/dL   Saturation Ratios 2.7 (L) 20.0 - 50.0 %   Ferritin 4.7 (L) 10.0 - 291.0 ng/mL   TIBC 445.2 250.0 - 450.0 mcg/dL  VITAMIN D 25 Hydroxy (Vit-D Deficiency, Fractures)  Result Value Ref Range   VITD 34.52 30.00 - 100.00 ng/mL   No results found.  Assessment and Plan:     ICD-10-CM   1. Healthcare maintenance  Z00.00 Tdap vaccine greater than or equal to 7yo IM    2. Need for Tdap vaccination  Z23 Tdap vaccine greater than or equal to 7yo IM     Overall, doing really well.  For iron deficiency anemia, she is going to restart her iron.  Otherwise, really would not want to change anything at all.  Health Maintenance Exam: The patient's preventative maintenance and recommended screening tests for an annual wellness exam were reviewed in full today. Brought up to date unless services declined.  Counselled on the importance of diet, exercise, and its role in overall health and mortality. The patient's  FH and SH was reviewed, including their home life, tobacco status, and drug and alcohol status.  Follow-up in 1 year for physical exam or additional follow-up below.  Follow-up: No follow-ups on file. Or follow-up in 1 year if not noted.  No future appointments.  No orders of the defined types were placed in this encounter.  There are no discontinued medications. Orders Placed This Encounter  Procedures   Tdap vaccine greater than or equal to 7yo IM    Signed,  Drayton Tieu T. Otis Portal,  MD   Allergies as of 08/03/2021       Reactions   Amoxicillin    Itching (once), no rash Rapid heart rate   Chicken Meat (diagnostic) Other (See Comments)   Throat feels tight and GERD   Dexilant [dexlansoprazole] Other (See Comments)   Throat feels tight        Medication List        Accurate as of August 03, 2021 10:03 AM. If you have any questions, ask your nurse or doctor.          albuterol (2.5 MG/3ML) 0.083% nebulizer solution Commonly known as: PROVENTIL Take 3 mLs (2.5 mg total) by nebulization every 6 (six) hours as needed for wheezing or shortness of breath.   albuterol 108 (90 Base) MCG/ACT inhaler Commonly known as: Ventolin HFA USE 2 PUFFS EVERY 4 HOURS AS NEEDED FOR WHEEZING   escitalopram 20 MG tablet Commonly known as: LEXAPRO TAKE 1/2 TABLET BY MOUTH DAILY   hydrOXYzine 10 MG tablet Commonly known as: ATARAX Take 1 tablet (10 mg total) by mouth every 6 (six) hours as needed. Anxiety   loratadine 10 MG tablet Commonly known as: Claritin Take 1 tablet (10 mg total) by mouth daily.   mupirocin ointment 2 % Commonly known as: BACTROBAN SMARTSIG:1 Application Topical 2-3 Times Daily   nystatin 100000 UNIT/ML suspension Commonly known as: MYCOSTATIN TAKE 5 MLS (500,000 UNITS TOTAL) BY MOUTH 4 (FOUR) TIMES DAILY.   propylthiouracil 50 MG tablet Commonly known as: PTU Take by mouth. Once a day.

## 2021-08-31 ENCOUNTER — Encounter: Payer: Self-pay | Admitting: Family Medicine

## 2021-08-31 ENCOUNTER — Telehealth (INDEPENDENT_AMBULATORY_CARE_PROVIDER_SITE_OTHER): Payer: Managed Care, Other (non HMO) | Admitting: Family Medicine

## 2021-08-31 VITALS — HR 89 | Temp 99.0°F | Ht 64.5 in | Wt 134.0 lb

## 2021-08-31 DIAGNOSIS — J0100 Acute maxillary sinusitis, unspecified: Secondary | ICD-10-CM

## 2021-08-31 MED ORDER — AZITHROMYCIN 250 MG PO TABS: ORAL_TABLET | ORAL | 0 refills | Status: AC

## 2021-08-31 NOTE — Telephone Encounter (Signed)
Spoke to patient by telephone and scheduled her for a video visit today 08/31/21 at 9:40 am.

## 2021-08-31 NOTE — Progress Notes (Signed)
Nykerria Macconnell T. Keoki Mchargue, MD, [place] at [facility] [place], [address]  Phone: [phone]  FAX: [place] - 46 y.o. female  MRN [id number]  Date of Birth: 13-Jan-1975  Date: 08/31/2021  PCP: Owens Loffler, MD  Referral: Owens Loffler, MD  Chief Complaint  Patient presents with   Nasal Congestion    Cough with tightness for 11 days.    Virtual Visit via Video Note:  I connected with  Alexyss Balzarini Borak on 08/31/2021  9:40 AM EDT by a video enabled telemedicine application and verified that I am speaking with the correct person using two identifiers.   Location patient: home computer, tablet, or smartphone Location provider: work or home office Consent: Verbal consent directly obtained from [employer]. Persons participating in the virtual visit: patient, provider  I discussed the limitations of evaluation and management by telemedicine and the availability of in person appointments. The patient expressed understanding and agreed to proceed.  Chief Complaint  Patient presents with   Nasal Congestion    Cough with tightness for 11 days.     History of Present Illness:  She has been sick now for 11 days.  She did have a low-grade fever for 7 days, and she has been coughing and now she has worsening pain in congestion in the max sinus region.  She has had a productive cough and she is now developing a little bit more chest tightness.  She does have asthma, but this is not particularly been flared up since she has been sick.  She has used her albuterol inhaler once.  At baseline it is usually fairly well controlled, and she uses no maintenance medications at this time.  She did feel as if she was improving, but over the last 2 days she has been worsening.  Review of Systems as above: See pertinent positives and pertinent negatives per HPI No acute distress verbally    Observations/Objective/Exam:  An attempt was made to discern vital signs over the phone and per patient if applicable and possible.   General:    Alert, Oriented, appears well and in no acute distress  Pulmonary:     On inspection no signs of respiratory distress.  Psych / Neurological:     Pleasant and cooperative.  Assessment and Plan:    ICD-10-CM   1. Acute non-recurrent maxillary sinusitis  J01.00      Preceding viral syndrome with now worsening pain in the sinus region in the maxillary sinus, and it sounds as if this is started to develop into some sinusitis.  She also has some ongoing continued productive cough, but her COVID test have been negative x2.  I discussed the assessment and treatment plan with the patient. The patient was provided an opportunity to ask questions and all were answered. The patient agreed with the plan and demonstrated an understanding of the instructions.   The patient was advised to call back or seek an in-person evaluation if the symptoms worsen or if the condition fails to improve as anticipated.  Follow-up: prn unless noted otherwise below No follow-ups on file.  Meds ordered this encounter  Medications   azithromycin (ZITHROMAX) 250 MG tablet    Sig: Take 2 tablets (500 mg total) by mouth daily for 1 day, THEN 1 tablet (250 mg total) daily for 4 days.    Dispense:  6 tablet    Refill:  0   No  orders of the defined types were placed in this encounter.   Signed,  Maud Deed. Shanece Cochrane, MD

## 2021-09-11 ENCOUNTER — Encounter: Payer: Self-pay | Admitting: Family Medicine

## 2021-09-11 MED ORDER — ESCITALOPRAM OXALATE 20 MG PO TABS: 10.0000 mg | ORAL_TABLET | Freq: Every day | ORAL | 1 refills | Status: DC

## 2021-09-18 ENCOUNTER — Other Ambulatory Visit: Payer: Self-pay | Admitting: Family Medicine

## 2021-09-18 MED ORDER — NYSTATIN 100000 UNIT/ML MT SUSP: 5.0000 mL | Freq: Four times a day (QID) | OROMUCOSAL | 0 refills | Status: AC

## 2021-09-18 NOTE — Telephone Encounter (Signed)
Last office visit 08/31/21 for sinusitis.  Last refilled 03/06/20 for 473 ml with no refills.  No future appointments.

## 2021-10-12 ENCOUNTER — Encounter: Payer: Self-pay | Admitting: Family Medicine

## 2021-10-12 MED ORDER — ALBUTEROL SULFATE HFA 108 (90 BASE) MCG/ACT IN AERS: INHALATION_SPRAY | RESPIRATORY_TRACT | 2 refills | Status: DC

## 2021-11-26 ENCOUNTER — Other Ambulatory Visit: Payer: Self-pay | Admitting: Family Medicine

## 2021-12-31 ENCOUNTER — Telehealth: Payer: Managed Care, Other (non HMO) | Admitting: Physician Assistant

## 2021-12-31 DIAGNOSIS — J4541 Moderate persistent asthma with (acute) exacerbation: Secondary | ICD-10-CM | POA: Diagnosis not present

## 2021-12-31 DIAGNOSIS — J111 Influenza due to unidentified influenza virus with other respiratory manifestations: Secondary | ICD-10-CM

## 2021-12-31 MED ORDER — BENZONATATE 100 MG PO CAPS: 100.0000 mg | ORAL_CAPSULE | Freq: Three times a day (TID) | ORAL | 0 refills | Status: DC | PRN

## 2021-12-31 MED ORDER — OSELTAMIVIR PHOSPHATE 75 MG PO CAPS: 75.0000 mg | ORAL_CAPSULE | Freq: Two times a day (BID) | ORAL | 0 refills | Status: DC

## 2021-12-31 MED ORDER — ONDANSETRON HCL 4 MG PO TABS: 4.0000 mg | ORAL_TABLET | Freq: Three times a day (TID) | ORAL | 0 refills | Status: DC | PRN

## 2021-12-31 MED ORDER — PREDNISONE 20 MG PO TABS: 40.0000 mg | ORAL_TABLET | Freq: Every day | ORAL | 0 refills | Status: DC

## 2021-12-31 NOTE — Patient Instructions (Addendum)
Gloria Rush, thank you for joining [name] Daring, PA-C for today's virtual visit.  While this provider is not your primary care provider (PCP), if your PCP is located in our provider database this encounter information will be shared with them immediately following your visit.   [facility] account gives you access to today's visit and all your visits, tests, and labs performed at [facility] " click here if you don't have a [facility] account or go to [email or web address]  Consent: (Patient) Gloria Rush provided verbal consent for this virtual visit at the beginning of the encounter.  Current Medications:  Current Outpatient Medications:    benzonatate (TESSALON) 100 MG capsule, Take 1 capsule (100 mg total) by mouth 3 (three) times daily as needed., Disp: 30 capsule, Rfl: 0   ondansetron (ZOFRAN) 4 MG tablet, Take 1 tablet (4 mg total) by mouth every 8 (eight) hours as needed for nausea or vomiting., Disp: 20 tablet, Rfl: 0   oseltamivir (TAMIFLU) 75 MG capsule, Take 1 capsule (75 mg total) by mouth 2 (two) times daily., Disp: 10 capsule, Rfl: 0   predniSONE (DELTASONE) 20 MG tablet, Take 2 tablets (40 mg total) by mouth daily with breakfast., Disp: 10 tablet, Rfl: 0   albuterol (PROVENTIL) (2.5 MG/3ML) 0.083% nebulizer solution, Take 3 mLs (2.5 mg total) by nebulization every 6 (six) hours as needed for wheezing or shortness of breath., Disp: 150 mL, Rfl: 0   albuterol (VENTOLIN HFA) 108 (90 Base) MCG/ACT inhaler, USE 2 PUFFS EVERY 4 HOURS AS NEEDED FOR WHEEZING, Disp: 18 g, Rfl: 2   escitalopram (LEXAPRO) 20 MG tablet, Take 0.5 tablets (10 mg total) by mouth daily., Disp: 45 tablet, Rfl: 1   hydrOXYzine (ATARAX) 10 MG tablet, TAKE 1 TABLET (10 MG TOTAL) BY MOUTH EVERY 6 (SIX) HOURS AS NEEDED. ANXIETY, Disp: 50 tablet, Rfl: 2   loratadine (CLARITIN) 10 MG tablet, Take 1 tablet (10 mg total) by mouth daily., Disp: 90 tablet, Rfl: 1   mupirocin  ointment (BACTROBAN) 2 %, SMARTSIG:1 Application Topical 2-3 Times Daily, Disp: 22 g, Rfl: 3   nystatin (MYCOSTATIN) 100000 UNIT/ML suspension, Take 5 mLs (500,000 Units total) by mouth 4 (four) times daily., Disp: 473 mL, Rfl: 0   propylthiouracil (PTU) 50 MG tablet, Take by mouth. Once a day., Disp: , Rfl:   Current Facility-Administered Medications:    0.9 %  sodium chloride infusion, 500 mL, Intravenous, Continuous, Pyrtle, Lajuan Lines, MD   Medications ordered in this encounter:  Meds ordered this encounter  Medications   predniSONE (DELTASONE) 20 MG tablet    Sig: Take 2 tablets (40 mg total) by mouth daily with breakfast.    Dispense:  10 tablet    Refill:  0    Order Specific Question:   Supervising Provider    Answer:   Bari Mantis   oseltamivir (TAMIFLU) 75 MG capsule    Sig: Take 1 capsule (75 mg total) by mouth 2 (two) times daily.    Dispense:  10 capsule    Refill:  0    Order Specific Question:   Supervising Provider    Answer:   Chase Picket [[id number]]   benzonatate (TESSALON) 100 MG capsule    Sig: Take 1 capsule (100 mg total) by mouth 3 (three) times daily as needed.    Dispense:  30 capsule    Refill:  0    Order Specific Question:   Supervising Provider  Answer:   LAMPTEY, PHILIP O [[id number]]   ondansetron (ZOFRAN) 4 MG tablet    Sig: Take 1 tablet (4 mg total) by mouth every 8 (eight) hours as needed for nausea or vomiting.    Dispense:  20 tablet    Refill:  0    Order Specific Question:   Supervising Provider    Answer:   Chase Picket [id number]     *If you need refills on other medications prior to your next appointment, please contact your pharmacy*  Follow-Up: Call back or seek an in-person evaluation if the symptoms worsen or if the condition fails to improve as anticipated.  [place] [phone]  Other Instructions  You will need to schedule the video visit. You can do this through one of two ways:  1)  Go into your MyChart App and select the "Menu" button, then select the "Virtual Urgent Care Visit" then proceed scheduling -OR- 2) Go to [email or web address] and select "Get Started" under the Virtual Urgent Care option, select "View all options", then select the "Schedule on your Time" and proceed with scheduling.  Best Regards,  [name], PA-C   Influenza, Adult Influenza, also called "the flu," is a viral infection that mainly affects the respiratory tract. This includes the lungs, nose, and throat. The flu spreads easily from person to person (is contagious). It causes common cold symptoms, along with high fever and body aches. What are the causes? This condition is caused by the influenza virus. You can get the virus by: Breathing in droplets that are in the air from an infected person's cough or sneeze. Touching something that has the virus on it (has been contaminated) and then touching your mouth, nose, or eyes. What increases the risk? The following factors may make you more likely to get the flu: Not washing or sanitizing your hands often. Having close contact with many people during cold and flu season. Touching your mouth, eyes, or nose without first washing or sanitizing your hands. Not getting an annual flu shot. You may have a higher risk for the flu, including serious problems, such as a lung infection (pneumonia), if you: Are older than 65. Are pregnant. Have a weakened disease-fighting system (immune system). This includes people who have HIV or AIDS, are on chemotherapy, or are taking medicines that reduce (suppress) the immune system. Have a long-term (chronic) illness, such as heart disease, kidney disease, diabetes, or lung disease. Have a liver disorder. Are severely overweight (morbidly obese). Have anemia. Have asthma. What are the signs or symptoms? Symptoms of this condition usually begin suddenly and last 4-14 days. These may include: Fever and  chills. Headaches, body aches, or muscle aches. Sore throat. Cough. Runny or stuffy (congested) nose. Chest discomfort. Poor appetite. Weakness or fatigue. Dizziness. Nausea or vomiting. How is this diagnosed? This condition may be diagnosed based on: Your symptoms and medical history. A physical exam. Swabbing your nose or throat and testing the fluid for the influenza virus. How is this treated? If the flu is diagnosed early, you can be treated with antiviral medicine that is given by mouth (orally) or through an IV. This can help reduce how severe the illness is and how long it lasts. Taking care of yourself at home can help relieve symptoms. Your health care provider may recommend: Taking over-the-counter medicines. Drinking plenty of fluids. In many cases, the flu goes away on its own. If you have severe symptoms or complications, you may be treated  in a hospital. Follow these instructions at home: Activity Rest as needed and get plenty of sleep. Stay home from work or school as told by your health care provider. Unless you are visiting your health care provider, avoid leaving home until your fever has been gone for 24 hours without taking medicine. Eating and drinking Take an oral rehydration solution (ORS). This is a drink that is sold at pharmacies and retail stores. Drink enough fluid to keep your urine pale yellow. Drink clear fluids in small amounts as you are able. Clear fluids include water, ice chips, fruit juice mixed with water, and low-calorie sports drinks. Eat bland, easy-to-digest foods in small amounts as you are able. These foods include bananas, applesauce, rice, lean meats, toast, and crackers. Avoid drinking fluids that contain a lot of sugar or caffeine, such as energy drinks, regular sports drinks, and soda. Avoid alcohol. Avoid spicy or fatty foods. General instructions     Take over-the-counter and prescription medicines only as told by your health  care provider. Use a cool mist humidifier to add humidity to the air in your home. This can make it easier to breathe. When using a cool mist humidifier, clean it daily. Empty the water and replace it with clean water. Cover your mouth and nose when you cough or sneeze. Wash your hands with soap and water often and for at least 20 seconds, especially after you cough or sneeze. If soap and water are not available, use alcohol-based hand sanitizer. Keep all follow-up visits. This is important. How is this prevented?  Get an annual flu shot. This is usually available in late summer, fall, or winter. Ask your health care provider when you should get your flu shot. Avoid contact with people who are sick during cold and flu season. This is generally fall and winter. Contact a health care provider if: You develop new symptoms. You have: Chest pain. Diarrhea. A fever. Your cough gets worse. You produce more mucus. You feel nauseous or you vomit. Get help right away if you: Develop shortness of breath or have difficulty breathing. Have skin or nails that turn a bluish color. Have severe pain or stiffness in your neck. Develop a sudden headache or sudden pain in your face or ear. Cannot eat or drink without vomiting. These symptoms may represent a serious problem that is an emergency. Do not wait to see if the symptoms will go away. Get medical help right away. Call your local emergency services (911 in the [place] Do not drive yourself to the hospital. Summary Influenza, also called "the flu," is a viral infection that primarily affects your respiratory tract. Symptoms of the flu usually begin suddenly and last 4-14 days. Getting an annual flu shot is the best way to prevent getting the flu. Stay home from work or school as told by your health care provider. Unless you are visiting your health care provider, avoid leaving home until your fever has been gone for 24 hours without taking  medicine. Keep all follow-up visits. This is important. This information is not intended to replace advice given to you by your health care provider. Make sure you discuss any questions you have with your health care provider. Document Revised: 08/17/2019 Document Reviewed: 08/17/2019 Elsevier Patient Education  [place].    If you have been instructed to have an in-person evaluation today at a local Urgent Care facility, please use the link below. It will take you to a list of all of our  available [facility], including address, phone number and hours of operation. Please do not delay care.  [facility]  If you or a family member do not have a primary care provider, use the link below to schedule a visit and establish care. When you choose a [facility] primary care physician or advanced practice provider, you gain a long-term partner in health. Find a Primary Care Provider  Learn more about [facility] in-office and virtual care options: [facility]

## 2021-12-31 NOTE — Progress Notes (Signed)
Virtual Visit Consent   Gloria Rush, you are scheduled for a virtual visit with a [facility] provider today. Just as with appointments in the office, your consent must be obtained to participate. Your consent will be active for this visit and any virtual visit you may have with one of our providers in the next 365 days. If you have a MyChart account, a copy of this consent can be sent to you electronically.  As this is a virtual visit, video technology does not allow for your provider to perform a traditional examination. This may limit your provider's ability to fully assess your condition. If your provider identifies any concerns that need to be evaluated in person or the need to arrange testing (such as labs, EKG, etc.), we will make arrangements to do so. Although advances in technology are sophisticated, we cannot ensure that it will always work on either your end or our end. If the connection with a video visit is poor, the visit may have to be switched to a telephone visit. With either a video or telephone visit, we are not always able to ensure that we have a secure connection.  By engaging in this virtual visit, you consent to the provision of healthcare and authorize for your insurance to be billed (if applicable) for the services provided during this visit. Depending on your insurance coverage, you may receive a charge related to this service.  I need to obtain your verbal consent now. Are you willing to proceed with your visit today? [name] has provided verbal consent on 12/31/2021 for a virtual visit (video or telephone). [name] Daring, PA-C  Date: 12/31/2021 2:13 PM  Virtual Visit via Video Note   I, [name] Daring, connected with  Gloria Rush  ([id number], [birth year]) on 12/31/21 at  2:15 PM EST by a video-enabled telemedicine application and verified that I am speaking with the correct person using two identifiers.  Location: Patient: Virtual Visit  Location Patient: Home Provider: Virtual Visit Location Provider: Home Office   I discussed the limitations of evaluation and management by telemedicine and the availability of in person appointments. The patient expressed understanding and agreed to proceed.    History of Present Illness: Gloria Rush is a 46 y.o. who identifies as a female who was assigned female at birth, and is being seen today for URI symptoms.  HPI: URI  This is a new problem. The current episode started yesterday. The problem has been gradually worsening. The maximum temperature recorded prior to her arrival was 100.4 - 100.9 F (100.8). The fever has been present for Less than 1 day. Associated symptoms include chest pain (burns and tightness; has asthma), congestion, coughing, headaches, nausea, rhinorrhea, a sore throat (from vomiting), vomiting (with coughing) and wheezing. Pertinent negatives include no ear pain or plugged ear sensation. She has tried inhaler use, antihistamine and acetaminophen for the symptoms. The treatment provided no relief.    O2 sat 100%, HR 133 (after albuterol) Negative at home covid testing Son being treated for influenza  Problems:  Patient Active Problem List   Diagnosis Date Noted   Increased risk of breast cancer 10/31/2018   Family history of breast cancer 06/11/2016   Lynch syndrome 04/13/2013   Unspecified vitamin D deficiency 08/24/2012   Major depressive disorder, recurrent, in full remission ([place]) 12/18/2009   ALLERGIC CONJUNCTIVITIS 12/23/2008   DERMATITIS, ATOPIC 12/23/2008   Panic disorder without agoraphobia with panic attacks in partial remission 01/23/2008  GERD 12/05/2007   Allergic rhinitis 12/27/2006   Asthma, moderate persistent, well-controlled 12/27/2006    Allergies:  Allergies  Allergen Reactions   Amoxicillin     Itching (once), no rash Rapid heart rate   Chicken Meat (Diagnostic) Other (See Comments)    Throat feels tight and GERD   Dexilant  [Dexlansoprazole] Other (See Comments)    Throat feels tight   Medications:  Current Outpatient Medications:    benzonatate (TESSALON) 100 MG capsule, Take 1 capsule (100 mg total) by mouth 3 (three) times daily as needed., Disp: 30 capsule, Rfl: 0   ondansetron (ZOFRAN) 4 MG tablet, Take 1 tablet (4 mg total) by mouth every 8 (eight) hours as needed for nausea or vomiting., Disp: 20 tablet, Rfl: 0   oseltamivir (TAMIFLU) 75 MG capsule, Take 1 capsule (75 mg total) by mouth 2 (two) times daily., Disp: 10 capsule, Rfl: 0   predniSONE (DELTASONE) 20 MG tablet, Take 2 tablets (40 mg total) by mouth daily with breakfast., Disp: 10 tablet, Rfl: 0   albuterol (PROVENTIL) (2.5 MG/3ML) 0.083% nebulizer solution, Take 3 mLs (2.5 mg total) by nebulization every 6 (six) hours as needed for wheezing or shortness of breath., Disp: 150 mL, Rfl: 0   albuterol (VENTOLIN HFA) 108 (90 Base) MCG/ACT inhaler, USE 2 PUFFS EVERY 4 HOURS AS NEEDED FOR WHEEZING, Disp: 18 g, Rfl: 2   escitalopram (LEXAPRO) 20 MG tablet, Take 0.5 tablets (10 mg total) by mouth daily., Disp: 45 tablet, Rfl: 1   hydrOXYzine (ATARAX) 10 MG tablet, TAKE 1 TABLET (10 MG TOTAL) BY MOUTH EVERY 6 (SIX) HOURS AS NEEDED. ANXIETY, Disp: 50 tablet, Rfl: 2   loratadine (CLARITIN) 10 MG tablet, Take 1 tablet (10 mg total) by mouth daily., Disp: 90 tablet, Rfl: 1   mupirocin ointment (BACTROBAN) 2 %, SMARTSIG:1 Application Topical 2-3 Times Daily, Disp: 22 g, Rfl: 3   nystatin (MYCOSTATIN) 100000 UNIT/ML suspension, Take 5 mLs (500,000 Units total) by mouth 4 (four) times daily., Disp: 473 mL, Rfl: 0   propylthiouracil (PTU) 50 MG tablet, Take by mouth. Once a day., Disp: , Rfl:   Current Facility-Administered Medications:    0.9 %  sodium chloride infusion, 500 mL, Intravenous, Continuous, Pyrtle, Lajuan Lines, MD  Observations/Objective: Patient is well-developed, well-nourished in no acute distress.  Resting comfortably at home.  Head is normocephalic,  atraumatic.  No labored breathing.  Speech is clear and coherent with logical content.  Patient is alert and oriented at baseline.    Assessment and Plan: 1. Influenza - predniSONE (DELTASONE) 20 MG tablet; Take 2 tablets (40 mg total) by mouth daily with breakfast.  Dispense: 10 tablet; Refill: 0 - oseltamivir (TAMIFLU) 75 MG capsule; Take 1 capsule (75 mg total) by mouth 2 (two) times daily.  Dispense: 10 capsule; Refill: 0 - benzonatate (TESSALON) 100 MG capsule; Take 1 capsule (100 mg total) by mouth 3 (three) times daily as needed.  Dispense: 30 capsule; Refill: 0 - ondansetron (ZOFRAN) 4 MG tablet; Take 1 tablet (4 mg total) by mouth every 8 (eight) hours as needed for nausea or vomiting.  Dispense: 20 tablet; Refill: 0  2. Moderate persistent asthma with exacerbation - predniSONE (DELTASONE) 20 MG tablet; Take 2 tablets (40 mg total) by mouth daily with breakfast.  Dispense: 10 tablet; Refill: 0  - Suspected viral URI with negative covid testing - Possible flu as it is prevalent in the community at this time - Limited testing availability that would delay appropriate treatment  waiting on results - Tamiflu prescribed for possible flu - Prednisone for asthma exacerbation - Tessalon for cough - Zofran for nausea - Push fluids - Symptomatic management OTC of choice as needed - Seek in person evaluation if symptoms worsen or fail to improve   Follow Up Instructions: I discussed the assessment and treatment plan with the patient. The patient was provided an opportunity to ask questions and all were answered. The patient agreed with the plan and demonstrated an understanding of the instructions.  A copy of instructions were sent to the patient via MyChart unless otherwise noted below.    The patient was advised to call back or seek an in-person evaluation if the symptoms worsen or if the condition fails to improve as anticipated.  Time:  I spent 15 minutes with the patient via  telehealth technology discussing the above problems/concerns.    [name] Daring, PA-C

## 2022-01-01 ENCOUNTER — Ambulatory Visit: Payer: Self-pay

## 2022-01-01 ENCOUNTER — Telehealth: Payer: Managed Care, Other (non HMO) | Admitting: Family Medicine

## 2022-01-04 ENCOUNTER — Encounter: Payer: Self-pay | Admitting: Physician Assistant

## 2022-02-08 ENCOUNTER — Ambulatory Visit: Admission: RE | Admit: 2022-02-08 | Payer: Managed Care, Other (non HMO) | Source: Ambulatory Visit

## 2022-03-11 ENCOUNTER — Encounter: Payer: Self-pay | Admitting: Family Medicine

## 2022-03-11 ENCOUNTER — Other Ambulatory Visit: Payer: Self-pay | Admitting: Family Medicine

## 2022-03-12 ENCOUNTER — Telehealth: Payer: Managed Care, Other (non HMO) | Admitting: Nurse Practitioner

## 2022-03-12 DIAGNOSIS — J4521 Mild intermittent asthma with (acute) exacerbation: Secondary | ICD-10-CM

## 2022-03-12 MED ORDER — PREDNISONE 20 MG PO TABS: 20.0000 mg | ORAL_TABLET | Freq: Every day | ORAL | 0 refills | Status: DC

## 2022-03-12 MED ORDER — ALBUTEROL SULFATE HFA 108 (90 BASE) MCG/ACT IN AERS: INHALATION_SPRAY | RESPIRATORY_TRACT | 1 refills | Status: DC

## 2022-03-12 NOTE — Progress Notes (Signed)
Virtual Visit Consent   Gloria Kitchen Rush, you are scheduled for a virtual visit with a [facility] provider today. Just as with appointments in the office, your consent must be obtained to participate. Your consent will be active for this visit and any virtual visit you may have with one of our providers in the next 365 days. If you have a MyChart account, a copy of this consent can be sent to you electronically.  As this is a virtual visit, video technology does not allow for your provider to perform a traditional examination. This may limit your provider's ability to fully assess your condition. If your provider identifies any concerns that need to be evaluated in person or the need to arrange testing (such as labs, EKG, etc.), we will make arrangements to do so. Although advances in technology are sophisticated, we cannot ensure that it will always work on either your end or our end. If the connection with a video visit is poor, the visit may have to be switched to a telephone visit. With either a video or telephone visit, we are not always able to ensure that we have a secure connection.  By engaging in this virtual visit, you consent to the provision of healthcare and authorize for your insurance to be billed (if applicable) for the services provided during this visit. Depending on your insurance coverage, you may receive a charge related to this service.  I need to obtain your verbal consent now. Are you willing to proceed with your visit today? Gloria Rush has provided verbal consent on 03/12/2022 for a virtual visit (video or telephone). Gloria Schneiders, FNP  Date: 03/12/2022 2:16 PM  Virtual Visit via Video Note   I, Gloria Rush, connected with  Gloria Rush  ([id number], [id number]) on 03/12/22 at  2:15 PM EST by a video-enabled telemedicine application and verified that I am speaking with the correct person using two identifiers.  Location: Patient: Virtual Visit Location Patient:  Home Provider: Virtual Visit Location Provider: Home Office   I discussed the limitations of evaluation and management by telemedicine and the availability of in person appointments. The patient expressed understanding and agreed to proceed.    History of Present Illness: Gloria Rush is a 47 y.o. who identifies as a female who was assigned female at birth, and is being seen today for "aching" in her lungs that started two days ago   She feels that her asthma may be starting to act up due to weather changes  She denies wheezing but feels that her symptoms are similar to when her asthma acts up. When this has happened in the past she has responded well to prednisone bursts  She has used her inhaler with some relief (currently has Albuterol but feels she responds better to Ventolin)   Denies fever, runny nose or other symptoms at this time of URI  Had improvement with Benadryl last night  She does stay on hydroxyzine   She also started Monistat today for a suspected vaginal yeast infection   Problems:  Patient Active Problem List   Diagnosis Date Noted   Increased risk of breast cancer 10/31/2018   Family history of breast cancer 06/11/2016   Lynch syndrome 04/13/2013   Unspecified vitamin D deficiency 08/24/2012   Major depressive disorder, recurrent, in full remission ([facility]) 12/18/2009   ALLERGIC CONJUNCTIVITIS 12/23/2008   DERMATITIS, ATOPIC 12/23/2008   Panic disorder without agoraphobia with panic attacks in partial remission 01/23/2008   GERD  12/05/2007   Allergic rhinitis 12/27/2006   Asthma, moderate persistent, well-controlled 12/27/2006    Allergies:  Allergies  Allergen Reactions   Amoxicillin     Itching (once), no rash Rapid heart rate   Chicken Meat (Diagnostic) Other (See Comments)    Throat feels tight and GERD   Dexilant [Dexlansoprazole] Other (See Comments)    Throat feels tight   Medications:  Current Outpatient Medications:    albuterol  (PROVENTIL) (2.5 MG/3ML) 0.083% nebulizer solution, Take 3 mLs (2.5 mg total) by nebulization every 6 (six) hours as needed for wheezing or shortness of breath., Disp: 150 mL, Rfl: 0   albuterol (VENTOLIN HFA) 108 (90 Base) MCG/ACT inhaler, USE 2 PUFFS EVERY 4 HOURS AS NEEDED FOR WHEEZING, Disp: 18 g, Rfl: 2   benzonatate (TESSALON) 100 MG capsule, Take 1 capsule (100 mg total) by mouth 3 (three) times daily as needed., Disp: 30 capsule, Rfl: 0   escitalopram (LEXAPRO) 20 MG tablet, TAKE 1/2 TABLET BY MOUTH DAILY, Disp: 45 tablet, Rfl: 1   hydrOXYzine (ATARAX) 10 MG tablet, TAKE 1 TABLET (10 MG TOTAL) BY MOUTH EVERY 6 (SIX) HOURS AS NEEDED. ANXIETY, Disp: 50 tablet, Rfl: 2   loratadine (CLARITIN) 10 MG tablet, Take 1 tablet (10 mg total) by mouth daily., Disp: 90 tablet, Rfl: 1   mupirocin ointment (BACTROBAN) 2 %, SMARTSIG:1 Application Topical 2-3 Times Daily, Disp: 22 g, Rfl: 3   nystatin (MYCOSTATIN) 100000 UNIT/ML suspension, Take 5 mLs (500,000 Units total) by mouth 4 (four) times daily., Disp: 473 mL, Rfl: 0   ondansetron (ZOFRAN) 4 MG tablet, Take 1 tablet (4 mg total) by mouth every 8 (eight) hours as needed for nausea or vomiting., Disp: 20 tablet, Rfl: 0   oseltamivir (TAMIFLU) 75 MG capsule, Take 1 capsule (75 mg total) by mouth 2 (two) times daily., Disp: 10 capsule, Rfl: 0   predniSONE (DELTASONE) 20 MG tablet, Take 2 tablets (40 mg total) by mouth daily with breakfast., Disp: 10 tablet, Rfl: 0   propylthiouracil (PTU) 50 MG tablet, Take by mouth. Once a day., Disp: , Rfl:   Current Facility-Administered Medications:    0.9 %  sodium chloride infusion, 500 mL, Intravenous, Continuous, Pyrtle, Lajuan Lines, MD  Observations/Objective: Patient is well-developed, well-nourished in no acute distress.  Resting comfortably  at home.  Head is normocephalic, atraumatic.  No labored breathing.  Speech is clear and coherent with logical content.  Patient is alert and oriented at baseline.     Assessment and Plan: 1. Mild intermittent asthma with acute exacerbation Continue daily antihistamine use   - albuterol (VENTOLIN HFA) 108 (90 Base) MCG/ACT inhaler; 1-2 puffs every 4-6 hours as needed. Rinse mouth after use  Dispense: 1 each; Refill: 1 - predniSONE (DELTASONE) 20 MG tablet; Take 1 tablet (20 mg total) by mouth daily with breakfast for 5 days.  Dispense: 5 tablet; Refill: 0     Follow Up Instructions: I discussed the assessment and treatment plan with the patient. The patient was provided an opportunity to ask questions and all were answered. The patient agreed with the plan and demonstrated an understanding of the instructions.  A copy of instructions were sent to the patient via MyChart unless otherwise noted below.    The patient was advised to call back or seek an in-person evaluation if the symptoms worsen or if the condition fails to improve as anticipated.  Time:  I spent 10 minutes with the patient via telehealth technology discussing the above problems/concerns.  Gloria Schneiders, FNP

## 2022-03-13 ENCOUNTER — Encounter: Payer: Self-pay | Admitting: Nurse Practitioner

## 2022-03-15 ENCOUNTER — Encounter: Payer: Self-pay | Admitting: Family Medicine

## 2022-03-15 ENCOUNTER — Telehealth (INDEPENDENT_AMBULATORY_CARE_PROVIDER_SITE_OTHER): Payer: Managed Care, Other (non HMO) | Admitting: Family Medicine

## 2022-03-15 VITALS — HR 83 | Ht 64.75 in

## 2022-03-15 DIAGNOSIS — J208 Acute bronchitis due to other specified organisms: Secondary | ICD-10-CM | POA: Diagnosis not present

## 2022-03-15 DIAGNOSIS — J4541 Moderate persistent asthma with (acute) exacerbation: Secondary | ICD-10-CM | POA: Diagnosis not present

## 2022-03-15 MED ORDER — AZITHROMYCIN 250 MG PO TABS: ORAL_TABLET | ORAL | 0 refills | Status: AC

## 2022-03-15 MED ORDER — PREDNISONE 20 MG PO TABS: ORAL_TABLET | ORAL | 0 refills | Status: DC

## 2022-03-15 NOTE — Progress Notes (Signed)
Mancel Lardizabal T. Anelia Carriveau, MD, [place] at [facility] [place], [address]  Phone: [phone]  FAX: Gloria Rush - 47 y.o. female  MRN [id number]  Date of Birth: [name] 23, 1975  Date: 03/15/2022  PCP: Owens Loffler, MD  Referral: Owens Loffler, MD  Chief Complaint  Patient presents with   Asthma    Follow up prednisone E-Visit 03/12/2022-Still feels heavy in her chest   Virtual Visit via Video Note:  I connected with  Vianne Ceasar Kertz on 03/15/2022  3:40 PM EST by a video enabled telemedicine application and verified that I am speaking with the correct person using two identifiers.   Location patient: home computer, tablet, or smartphone Location provider: work or home office Consent: Verbal consent directly obtained from [employer]. Persons participating in the virtual visit: patient, provider  I discussed the limitations of evaluation and management by telemedicine and the availability of in person appointments. The patient expressed understanding and agreed to proceed.  Chief Complaint  Patient presents with   Asthma    Follow up prednisone E-Visit 03/12/2022-Still feels heavy in her chest    History of Present Illness:  Feeling bad over the weekend.  She had a panic attack last into the last week, and then she started to feel bad.  She has been having some shortness of breath and chest tightness.  She does have a history of some intermittent asthma, and she at least previously was on scheduled maintenance medication.  Right now she is using some albuterol.   Review of Systems as above: See pertinent positives and pertinent negatives per HPI No acute distress verbally   Observations/Objective/Exam:  An attempt was made to discern vital signs over the phone and per patient if applicable and possible.   General:    Alert, Oriented, appears well and in no acute distress  Pulmonary:      On inspection no signs of respiratory distress.  Psych / Neurological:     Pleasant and cooperative.  Assessment and Plan:    ICD-10-CM   1. Acute bronchitis due to other specified organisms  J20.8     2. Moderate persistent asthma with exacerbation  J45.41      Asthma exacerbation, I think she needs to be on more prednisone she is likely undertreated at this point.  Since she is worsening, also think out we will place her on some antibiotics with her lack of treatment to just steroids.  I discussed the assessment and treatment plan with the patient. The patient was provided an opportunity to ask questions and all were answered. The patient agreed with the plan and demonstrated an understanding of the instructions.   The patient was advised to call back or seek an in-person evaluation if the symptoms worsen or if the condition fails to improve as anticipated.  Follow-up: prn unless noted otherwise below No follow-ups on file.  Meds ordered this encounter  Medications   azithromycin (ZITHROMAX) 250 MG tablet    Sig: Take 2 tablets (500 mg total) by mouth daily for 1 day, THEN 1 tablet (250 mg total) daily for 4 days.    Dispense:  6 tablet    Refill:  0   predniSONE (DELTASONE) 20 MG tablet    Sig: 2 tabs po for 4 days, then 1 tab po for 4 days    Dispense:  12 tablet    Refill:  0   No orders  of the defined types were placed in this encounter.   Signed,  Maud Deed. Kawanda Drumheller, MD

## 2022-03-23 ENCOUNTER — Encounter: Payer: Self-pay | Admitting: Family Medicine

## 2022-03-23 MED ORDER — MOMETASONE FUROATE 110 MCG/ACT IN AEPB
2.0000 | INHALATION_SPRAY | Freq: Every day | RESPIRATORY_TRACT | 5 refills | Status: DC
Start: 2022-03-23 — End: 2022-03-25

## 2022-03-25 MED ORDER — ASMANEX HFA 100 MCG/ACT IN AERO: 2.0000 | INHALATION_SPRAY | Freq: Two times a day (BID) | RESPIRATORY_TRACT | 5 refills | Status: DC

## 2022-03-25 NOTE — Addendum Note (Signed)
Addended by: Owens Loffler on: 03/25/2022 02:11 PM   Modules accepted: Orders

## 2022-04-22 ENCOUNTER — Telehealth: Payer: Managed Care, Other (non HMO) | Admitting: Internal Medicine

## 2022-04-22 ENCOUNTER — Encounter: Payer: Self-pay | Admitting: Internal Medicine

## 2022-04-22 VITALS — HR 76 | Wt 123.0 lb

## 2022-04-22 DIAGNOSIS — J4521 Mild intermittent asthma with (acute) exacerbation: Secondary | ICD-10-CM | POA: Diagnosis not present

## 2022-04-22 DIAGNOSIS — J45901 Unspecified asthma with (acute) exacerbation: Secondary | ICD-10-CM | POA: Insufficient documentation

## 2022-04-22 MED ORDER — AZITHROMYCIN 250 MG PO TABS: ORAL_TABLET | ORAL | 0 refills | Status: DC

## 2022-04-22 MED ORDER — PREDNISONE 20 MG PO TABS: 40.0000 mg | ORAL_TABLET | Freq: Every day | ORAL | 0 refills | Status: DC

## 2022-04-22 NOTE — Progress Notes (Signed)
Subjective:    Patient ID: Gloria Rush, female    DOB: Apr 26, 1975, 47 y.o.   MRN: [id number]  HPI Video virtual visit due to respiratory symptoms Identification done Reviewed limitations and billing and she gave consent Participants--patient in her home and I am in my office  Has been exposed to lots of folks with son's activities  Did get completely better after last month's illness  Son got sick several days ago--husband and her got sick after (3 days ago) Head congestion, post nasal drip, malaise Yesterday started wheezing---but not really SOB Inhaler does help--albuterol No fever, chills or sweats---just feels a little warm  Does have pollen allergies--but not taking loratadine regularly Asmanex on back order ---plans to start when she gets it  Current Outpatient Medications on File Prior to Visit  Medication Sig Dispense Refill   albuterol (VENTOLIN HFA) 108 (90 Base) MCG/ACT inhaler USE 2 PUFFS EVERY 4 HOURS AS NEEDED FOR WHEEZING 18 g 2   escitalopram (LEXAPRO) 20 MG tablet TAKE 1/2 TABLET BY MOUTH DAILY 45 tablet 1   hydrOXYzine (ATARAX) 10 MG tablet TAKE 1 TABLET (10 MG TOTAL) BY MOUTH EVERY 6 (SIX) HOURS AS NEEDED. ANXIETY 50 tablet 2   loratadine (CLARITIN) 10 MG tablet Take 1 tablet (10 mg total) by mouth daily. 90 tablet 1   mupirocin ointment (BACTROBAN) 2 % SMARTSIG:1 Application Topical 2-3 Times Daily 22 g 3   nystatin (MYCOSTATIN) 100000 UNIT/ML suspension Take 5 mLs (500,000 Units total) by mouth 4 (four) times daily. 473 mL 0   propylthiouracil (PTU) 50 MG tablet Take by mouth. Once a day.     albuterol (PROVENTIL) (2.5 MG/3ML) 0.083% nebulizer solution Take 3 mLs (2.5 mg total) by nebulization every 6 (six) hours as needed for wheezing or shortness of breath. (Patient not taking: Reported on 04/22/2022) 150 mL 0   Mometasone Furoate (ASMANEX HFA) 100 MCG/ACT AERO Inhale 2 puffs into the lungs 2 (two) times daily. (Patient not taking: Reported on 04/22/2022)  13 g 5   No current facility-administered medications on file prior to visit.    Allergies  Allergen Reactions   Amoxicillin     Itching (once), no rash Rapid heart rate   Chicken Meat (Diagnostic) Other (See Comments)    Throat feels tight and GERD   Dexilant [Dexlansoprazole] Other (See Comments)    Throat feels tight    Past Medical History:  Diagnosis Date   Anxiety    Asthma    Family history of breast cancer    Fibroid    Genetic testing of female    MyRisk Positive   Increased risk of breast cancer 2014, 2018   riskscore=22.2% 6/18   Lynch syndrome    PMS2 pos 11/14, ATM VUS   Panic disorder     Past Surgical History:  Procedure Laterality Date   WISDOM TOOTH EXTRACTION      Family History  Problem Relation Age of Onset   Anxiety disorder Mother    Anxiety disorder Brother    Diabetes Maternal Grandfather    Breast cancer Maternal Grandmother 41   Breast cancer Other 1   Colon cancer Neg Hx    Stomach cancer Neg Hx     Social History   Socioeconomic History   Marital status: Married    Spouse name: Not on file   Number of children: Not on file   Years of education: Not on file   Highest education level: Not on file  Occupational History  Not on file  Tobacco Use   Smoking status: Never   Smokeless tobacco: Never  Vaping Use   Vaping Use: Never used  Substance and Sexual Activity   Alcohol use: No    Alcohol/week: 0.0 standard drinks of alcohol   Drug use: No   Sexual activity: Yes    Partners: Male    Birth control/protection: None  Other Topics Concern   Not on file  Social History Narrative   Not on file   Social Determinants of Health   Financial Resource Strain: Not on file  Food Insecurity: Not on file  Transportation Needs: Not on file  Physical Activity: Not on file  Stress: Not on file  Social Connections: Not on file  Intimate Partner Violence: Not on file   Review of Systems No nausea or vomiting Eating okay     Objective:   Physical Exam Constitutional:      Appearance: Normal appearance.  Pulmonary:     Effort: Pulmonary effort is normal. No respiratory distress.  Neurological:     Mental Status: She is alert.            Assessment & Plan:

## 2022-04-22 NOTE — Assessment & Plan Note (Signed)
Discussed using loratadine daily or twice a day in pollen season Will give prednisone 40 x 3, then 20 x 3 Z-pak to take if worsens over the next few days Didn't tolerate montelukast Discussed that she really needs the asmanex

## 2022-05-24 ENCOUNTER — Encounter: Payer: Self-pay | Admitting: Family Medicine

## 2022-05-25 MED ORDER — HYDROXYZINE HCL 10 MG PO TABS: 10.0000 mg | ORAL_TABLET | Freq: Four times a day (QID) | ORAL | 2 refills | Status: DC | PRN

## 2022-05-27 ENCOUNTER — Encounter: Payer: Self-pay | Admitting: Family Medicine

## 2022-06-21 ENCOUNTER — Other Ambulatory Visit: Payer: Self-pay | Admitting: Obstetrics and Gynecology

## 2022-06-21 DIAGNOSIS — Z1231 Encounter for screening mammogram for malignant neoplasm of breast: Secondary | ICD-10-CM

## 2022-07-01 ENCOUNTER — Telehealth: Payer: Self-pay | Admitting: Obstetrics and Gynecology

## 2022-07-01 DIAGNOSIS — Z1509 Genetic susceptibility to other malignant neoplasm: Secondary | ICD-10-CM

## 2022-07-01 DIAGNOSIS — R102 Pelvic and perineal pain: Secondary | ICD-10-CM

## 2022-07-01 NOTE — Telephone Encounter (Signed)
Pain sent me message Tues, June 18 stating she was having pelvic pain again. Hx of ovar cysts. Taking tylenol without relief. Sx finally improved and pt had bloating/cramping yesterday. Will check GYN u/s. Due to annual/Lynch eval anyway. Will f/u with results.

## 2022-07-02 ENCOUNTER — Ambulatory Visit
Admission: RE | Admit: 2022-07-02 | Discharge: 2022-07-02 | Disposition: A | Discharge status: Home / Self Care | Payer: Managed Care, Other (non HMO) | Source: Ambulatory Visit | Attending: Obstetrics and Gynecology

## 2022-07-02 DIAGNOSIS — Z1231 Encounter for screening mammogram for malignant neoplasm of breast: Secondary | ICD-10-CM

## 2022-07-29 ENCOUNTER — Other Ambulatory Visit: Payer: Managed Care, Other (non HMO)

## 2022-08-04 ENCOUNTER — Other Ambulatory Visit: Payer: Self-pay | Admitting: Family Medicine

## 2022-08-04 DIAGNOSIS — Z131 Encounter for screening for diabetes mellitus: Secondary | ICD-10-CM

## 2022-08-04 DIAGNOSIS — E559 Vitamin D deficiency, unspecified: Secondary | ICD-10-CM

## 2022-08-04 DIAGNOSIS — Z13220 Encounter for screening for lipoid disorders: Secondary | ICD-10-CM

## 2022-08-04 DIAGNOSIS — E0590 Thyrotoxicosis, unspecified without thyrotoxic crisis or storm: Secondary | ICD-10-CM

## 2022-08-04 DIAGNOSIS — Z79899 Other long term (current) drug therapy: Secondary | ICD-10-CM

## 2022-08-05 ENCOUNTER — Encounter: Payer: Self-pay | Admitting: Obstetrics and Gynecology

## 2022-08-05 ENCOUNTER — Ambulatory Visit (INDEPENDENT_AMBULATORY_CARE_PROVIDER_SITE_OTHER): Payer: Managed Care, Other (non HMO) | Admitting: Obstetrics and Gynecology

## 2022-08-05 ENCOUNTER — Other Ambulatory Visit (HOSPITAL_COMMUNITY)
Admission: RE | Admit: 2022-08-05 | Discharge: 2022-08-05 | Disposition: A | Discharge status: Home / Self Care | Payer: Managed Care, Other (non HMO) | Source: Ambulatory Visit | Attending: Obstetrics and Gynecology | Admitting: Obstetrics and Gynecology

## 2022-08-05 VITALS — BP 108/74 | Ht 65.0 in | Wt 127.0 lb

## 2022-08-05 DIAGNOSIS — Z1509 Genetic susceptibility to other malignant neoplasm: Secondary | ICD-10-CM | POA: Insufficient documentation

## 2022-08-05 DIAGNOSIS — N939 Abnormal uterine and vaginal bleeding, unspecified: Secondary | ICD-10-CM

## 2022-08-05 DIAGNOSIS — Z1231 Encounter for screening mammogram for malignant neoplasm of breast: Secondary | ICD-10-CM

## 2022-08-05 DIAGNOSIS — Z01411 Encounter for gynecological examination (general) (routine) with abnormal findings: Secondary | ICD-10-CM

## 2022-08-05 DIAGNOSIS — Z01419 Encounter for gynecological examination (general) (routine) without abnormal findings: Secondary | ICD-10-CM

## 2022-08-05 DIAGNOSIS — Z9189 Other specified personal risk factors, not elsewhere classified: Secondary | ICD-10-CM

## 2022-08-05 DIAGNOSIS — D500 Iron deficiency anemia secondary to blood loss (chronic): Secondary | ICD-10-CM | POA: Insufficient documentation

## 2022-08-05 DIAGNOSIS — Z803 Family history of malignant neoplasm of breast: Secondary | ICD-10-CM

## 2022-08-05 DIAGNOSIS — Z1211 Encounter for screening for malignant neoplasm of colon: Secondary | ICD-10-CM

## 2022-08-05 DIAGNOSIS — R1031 Right lower quadrant pain: Secondary | ICD-10-CM

## 2022-08-05 LAB — POCT URINALYSIS DIPSTICK
Bilirubin, UA: NEGATIVE
Blood, UA: NEGATIVE
Glucose, UA: NEGATIVE
Ketones, UA: NEGATIVE
Leukocytes, UA: NEGATIVE
Nitrite, UA: NEGATIVE
Protein, UA: NEGATIVE
Spec Grav, UA: 1.015 (ref 1.010–1.025)
pH, UA: 6 (ref 5.0–8.0)

## 2022-08-05 NOTE — Patient Instructions (Signed)
I value your feedback and you entrusting us with your care. If you get a [facility] patient survey, I would appreciate you taking the time to let us know about your experience today. Thank you! ? ? ?

## 2022-08-05 NOTE — Progress Notes (Addendum)
Chief Complaint  Patient presents with   Gynecologic Exam    No concerns     HPI:      [name] is a 47 y.o. G0P0000 who LMP was Patient's last menstrual period was 07/15/2022 (exact date)., presents today for her annual examination.  Her menses are regular every 28-30 days, lasting 6-7 days, lighter flow now, used to be mod to heavy, no BTB, mild dysmen, worse with ovulation than period.  Hx of IDA  with low ferritin levels on 7/20 labs; improved 7/21 and 7/22 labs but worse again 7/23. Pt takes Fe supp occas. H/H=9.2/30.6 7/23. Has repeat lab appt next wk.  Tried lysteda for menorrhagia in past with good sx relief, but pt has asthma and caused chest tightness for her. Has tried a couple different OCPs for cycle control (and ovar cancer prevention) but increases pt's anxiety. Hx of 4.5 cm leio on 12/21 GYN u/s, has GYN u/s  8/24. Also with hx of ovar cyst in past, resolved on 10/20 GYN u/s. Had severe RLQ pain 6/24 for a day or so that resolved.    Hx of thyroid disorder, controlled now. Feels menses are improved with this. Was seeing endocrine. Anxiety improved with thyroid control.   Sex activity: single partner, contraception - none. Hx of infertility. No pain/bleeding. Declines BC.  Last Pap: 12/11/19 Results were: no abnormalities /neg HPV DNA   Last mammogram: 07/02/22  Results were: normal--routine follow-up in 12 months.  There is a FH of breast cancer in her MGM, 2 mat grt aunts and mat aunt. There is no FH of ovarian cancer. The patient does do self-breast exams. The patient is BRCA neg but Lynch positive (PMS2). IBIS=20.1%  2014. Pt has not had screening breast MRI. Pt found out her breast cancer affected mat aunt is Lynch positive too. Pt's mom still declines testing.  No FH Lynch syndrome cancers.   Tobacco use: The patient denies current or previous tobacco use. Alcohol use: none  No drug use.  Exercise: mod active  She does get adequate calcium but not addl  Vitamin D in her diet. Normal Vit D on 7/23 labs.  Labs with PCP.  She is Lynch pos (PMS2).   Per 2024 NCCN guidelines,   Endometrial cancer:  Q1-2 yrs EMB (neg 11/21); done today,   Yearly u/s (done 12/21); has 8/24 appt.   Hyst recommended. Pt aware and considering due to heavy periods.    Ovarian cancer: Recommend BSO age 6 or after childbearing; pt not interested currently.    Suggest yearly GYN u/s (done 12/21, has appt 08/16/22) and ca-125 (neg 5/23); will RTO for GYN u/s and ca-125    OCPs if pt doesn't want BSO--tried in past but can't tolerate side effects of increased anxiety.  Small bowel/Colon cancer/Gastric: Upper GI Q2-4 yrs, Colonoscopy Q1-3 yrs. Pt had done with Dr. Rhea Belton 3/18 , thought she should wait to age 34 for insurance to cover even though doesn't apply to her. Would like referral now for insurance purposes.   Hepatobiliary cancers: no med mgmt guidelines  Pancreatic cancers:no FH; no screening options available  Urothelial: check UA yearly   Past Medical History:  Diagnosis Date   Anxiety    Asthma    Family history of breast cancer    Fibroid    Genetic testing of female    MyRisk Positive   Increased risk of breast cancer 2014, 2018   riskscore=22.2% 6/18   Lynch syndrome  PMS2 pos 11/14, ATM VUS   Panic disorder     Past Surgical History:  Procedure Laterality Date   WISDOM TOOTH EXTRACTION      Family History  Problem Relation Age of Onset   Anxiety disorder Mother    Anxiety disorder Brother    Breast cancer Maternal Grandmother 40   Diabetes Maternal Grandfather    Breast cancer Maternal Aunt 66       Lynch positive   Breast cancer Other 39   Colon cancer Neg Hx    Stomach cancer Neg Hx     Social History   Socioeconomic History   Marital status: Married    Spouse name: Not on file   Number of children: Not on file   Years of education: Not on file   Highest education level: Not on file  Occupational History   Not  on file  Tobacco Use   Smoking status: Never   Smokeless tobacco: Never  Vaping Use   Vaping status: Never Used  Substance and Sexual Activity   Alcohol use: No    Alcohol/week: 0.0 standard drinks of alcohol   Drug use: No   Sexual activity: Yes    Partners: Male    Birth control/protection: None  Other Topics Concern   Not on file  Social History Narrative   Not on file   Social Determinants of Health   Financial Resource Strain: Not on file  Food Insecurity: Not on file  Transportation Needs: Not on file  Physical Activity: Not on file  Stress: Not on file  Social Connections: Not on file  Intimate Partner Violence: Not on file     Current Outpatient Medications:    albuterol (PROVENTIL) (2.5 MG/3ML) 0.083% nebulizer solution, Take 3 mLs (2.5 mg total) by nebulization every 6 (six) hours as needed for wheezing or shortness of breath., Disp: 150 mL, Rfl: 0   albuterol (VENTOLIN HFA) 108 (90 Base) MCG/ACT inhaler, USE 2 PUFFS EVERY 4 HOURS AS NEEDED FOR WHEEZING, Disp: 18 g, Rfl: 2   escitalopram (LEXAPRO) 20 MG tablet, TAKE 1/2 TABLET BY MOUTH DAILY, Disp: 45 tablet, Rfl: 1   hydrOXYzine (ATARAX) 10 MG tablet, Take 1 tablet (10 mg total) by mouth every 6 (six) hours as needed. Anxiety, Disp: 50 tablet, Rfl: 2   loratadine (CLARITIN) 10 MG tablet, Take 1 tablet (10 mg total) by mouth daily., Disp: 90 tablet, Rfl: 1   Mometasone Furoate (ASMANEX HFA) 100 MCG/ACT AERO, Inhale 2 puffs into the lungs 2 (two) times daily., Disp: 13 g, Rfl: 5   mupirocin ointment (BACTROBAN) 2 %, SMARTSIG:1 Application Topical 2-3 Times Daily, Disp: 22 g, Rfl: 3   nystatin (MYCOSTATIN) 100000 UNIT/ML suspension, Take 5 mLs (500,000 Units total) by mouth 4 (four) times daily., Disp: 473 mL, Rfl: 0   propylthiouracil (PTU) 50 MG tablet, Take by mouth. Once a day., Disp: , Rfl:   ROS:  Review of Systems  Constitutional:  Negative for fatigue, fever and unexpected weight change.  Respiratory:   Negative for cough, shortness of breath and wheezing.   Cardiovascular:  Negative for chest pain, palpitations and leg swelling.  Gastrointestinal:  Negative for blood in stool, constipation, diarrhea, nausea and vomiting.  Endocrine: Negative for cold intolerance, heat intolerance and polyuria.  Genitourinary:  Negative for dyspareunia, dysuria, flank pain, frequency, genital sores, hematuria, menstrual problem, pelvic pain, urgency, vaginal bleeding, vaginal discharge and vaginal pain.  Musculoskeletal:  Negative for back pain, joint swelling and myalgias.  Skin:  Negative for rash.  Neurological:  Negative for dizziness, syncope, light-headedness, numbness and headaches.  Hematological:  Negative for adenopathy.  Psychiatric/Behavioral:  Negative for agitation, confusion, sleep disturbance and suicidal ideas. The patient is not nervous/anxious.      Objective: BP 108/74   Ht 5\' 5"  (1.651 m)   Wt 127 lb (57.6 kg)   LMP 07/15/2022 (Exact Date)   BMI 21.13 kg/m    Physical Exam Constitutional:      Appearance: She is well-developed.  Genitourinary:     Vulva normal.     Right Labia: No rash, tenderness or lesions.    Left Labia: No tenderness, lesions or rash.    No vaginal discharge, erythema or tenderness.      Right Adnexa: not tender and no mass present.    Left Adnexa: not tender and no mass present.    No cervical motion tenderness, friability or polyp.     Uterus is enlarged.     Uterus is not tender.  Breasts:    Right: No mass, nipple discharge, skin change or tenderness.     Left: No mass, nipple discharge, skin change or tenderness.  Neck:     Thyroid: No thyromegaly.  Cardiovascular:     Rate and Rhythm: Normal rate and regular rhythm.     Heart sounds: Normal heart sounds. No murmur heard. Pulmonary:     Effort: Pulmonary effort is normal.     Breath sounds: Normal breath sounds.  Abdominal:     Palpations: Abdomen is soft.     Tenderness: There is no  abdominal tenderness. There is no guarding or rebound.  Musculoskeletal:        General: Normal range of motion.     Cervical back: Normal range of motion.  Lymphadenopathy:     Cervical: No cervical adenopathy.  Neurological:     General: No focal deficit present.     Mental Status: She is alert and oriented to person, place, and time.     Cranial Nerves: No cranial nerve deficit.  Skin:    General: Skin is warm and dry.  Psychiatric:        Mood and Affect: Mood normal.        Behavior: Behavior normal.        Thought Content: Thought content normal.        Judgment: Judgment normal.  Vitals reviewed.    UA was normal.   Endometrial Biopsy After discussion with the patient regarding her abnormal uterine bleeding I recommended that she proceed with an endometrial biopsy for further diagnosis. The risks, benefits, alternatives, and indications for an endometrial biopsy were discussed with the patient in detail. She understood the risks including infection, bleeding, cervical laceration and uterine perforation.  Verbal consent was obtained.   PROCEDURE NOTE:  Pipelle endometrial biopsy was performed using aseptic technique with iodine preparation.  The uterus was sounded to a length of 9.0 cm.  Adequate sampling was obtained with minimal blood loss.  The patient tolerated the procedure well.  Disposition will be pending pathology.  Assessment/Plan: Encounter for annual routine gynecological examination  Encounter for screening mammogram for malignant neoplasm of breast; pt current on mammo  Family history of breast cancer--Pt is BRCA neg.  Increased risk of breast cancer--IBIS=20.1%. Discussed monthly SBE, yearly CBE and mammos, as well as scr breast MRI. Pt to consider MRI and f/u if wants 5 months after mammo. Cont Vit D supp.   Lynch syndrome - Plan:  POCT Urinalysis Dipstick, Surgical pathology, CA 125, Ambulatory referral to Gastroenterology; pt has GYN u/s appt 8/24, will get  ca-125 at that time; possibly considering hyst. EMB today, refer to GI for Lynch syndrome cancer screening. Pt aware of PMS2 recommendations listed in HPI.  Screening for colon cancer - Plan: Ambulatory referral to Gastroenterology  Iron deficiency anemia due to chronic blood loss--pt to resume Fe every day. Has f/u labs next wk with PCP. Discussed importance of getting H/H and iron levels elevated.   RLQ abdominal pain--resolved; checking GYN u/s anyway. F/u prn             GYN counsel breast self exam, mammography screening, adequate intake of calcium and vitamin D, diet and exercise     F/U  Return in about 1 year (around 08/05/2023)./ 1 yr annual  [place] B. Kiaja Shorty, PA-C 08/09/2022 10:51 AM

## 2022-08-07 ENCOUNTER — Encounter: Payer: Self-pay | Admitting: Obstetrics and Gynecology

## 2022-08-11 ENCOUNTER — Other Ambulatory Visit: Payer: Managed Care, Other (non HMO)

## 2022-08-12 ENCOUNTER — Encounter: Payer: Self-pay | Admitting: Obstetrics and Gynecology

## 2022-08-16 ENCOUNTER — Other Ambulatory Visit: Payer: Managed Care, Other (non HMO)

## 2022-08-17 ENCOUNTER — Encounter: Payer: Self-pay | Admitting: Obstetrics and Gynecology

## 2022-08-18 ENCOUNTER — Encounter: Payer: Managed Care, Other (non HMO) | Admitting: Family Medicine

## 2022-09-03 ENCOUNTER — Other Ambulatory Visit: Payer: Self-pay | Admitting: Family Medicine

## 2022-09-03 ENCOUNTER — Encounter: Payer: Self-pay | Admitting: Family Medicine

## 2022-09-03 MED ORDER — HYDROXYZINE HCL 10 MG PO TABS: 10.0000 mg | ORAL_TABLET | Freq: Four times a day (QID) | ORAL | 2 refills | Status: DC | PRN

## 2022-09-28 ENCOUNTER — Telehealth: Payer: Self-pay

## 2022-09-28 NOTE — Telephone Encounter (Signed)
Patient states she is scheduled for a lab appointment Thursday annual labs. She started her period today and wanted to be sure this wouldn't affect her iron results. Advised iron may have lower results during menses, but not significant. Patient will keep scheduled lab appointment.

## 2022-09-30 ENCOUNTER — Encounter: Payer: Self-pay | Admitting: Obstetrics and Gynecology

## 2022-09-30 ENCOUNTER — Other Ambulatory Visit: Payer: Managed Care, Other (non HMO)

## 2022-09-30 DIAGNOSIS — Z1509 Genetic susceptibility to other malignant neoplasm: Secondary | ICD-10-CM

## 2022-10-01 LAB — CA 125: Cancer Antigen (CA) 125: 11.8 U/mL (ref 0.0–38.1)

## 2022-10-04 ENCOUNTER — Other Ambulatory Visit: Payer: Managed Care, Other (non HMO)

## 2022-10-04 ENCOUNTER — Ambulatory Visit (INDEPENDENT_AMBULATORY_CARE_PROVIDER_SITE_OTHER): Payer: Managed Care, Other (non HMO)

## 2022-10-04 DIAGNOSIS — R102 Pelvic and perineal pain: Secondary | ICD-10-CM | POA: Diagnosis not present

## 2022-10-04 DIAGNOSIS — Z1509 Genetic susceptibility to other malignant neoplasm: Secondary | ICD-10-CM

## 2022-10-05 ENCOUNTER — Encounter: Payer: Self-pay | Admitting: Obstetrics and Gynecology

## 2022-10-12 NOTE — Progress Notes (Signed)
Gloria Scantlin T. Subrena Devereux, MD, CAQ Sports Medicine [facility] at [facility] [address] [place], [address]  Phone: [phone]  FAX: [phone]  Gloria Rush - 47 y.o. female  MRN [id number]  Date of Birth: 1975-05-20  Date: 10/14/2022  PCP: Hannah Beat, MD  Referral: Hannah Beat, MD  Chief Complaint  Patient presents with   Annual Exam   Patient Care Team: Hannah Beat, MD as PCP - General Subjective:   Gloria Rush is a 47 y.o. pleasant patient who presents with the following:  Health Maintenance Summary Reviewed and updated, unless pt declines services.  Tobacco History Reviewed. Non-smoker Alcohol: No concerns, no excessive use Exercise Habits: Some activity, rec at least 30 mins 5 times a week STD concerns: none Drug Use: None Lumps or breast concerns: no  She is a very well-known patient, having been known for many years.  She presents today for physical exam.  She does have a history of Lynch syndrome, and she also has some asthma.  Colon - already set up Covid - will think about it Flu - no  She does have a history of significant iron deficiency anemia, and she does have a large amount of flow with her monthly menstruation.  Health Maintenance  Topic Date Due   Colonoscopy  03/30/2017   COVID-19 Vaccine (3 - Pfizer risk series) 10/08/2019   INFLUENZA VACCINE  04/11/2023 (Originally 08/12/2022)   Cervical Cancer Screening (HPV/Pap Cotest)  12/10/2024   DTaP/Tdap/Td (3 - Td or Tdap) 08/04/2031   Hepatitis C Screening  Completed   HIV Screening  Completed   HPV VACCINES  Aged Out    Immunization History  Administered Date(s) Administered   Influenza Split 12/24/2010   Influenza Whole 12/27/2006, 10/10/2007, 11/19/2009   Influenza, Quadrivalent, Recombinant, Inj, Pf 11/19/2015, 11/26/2016   Influenza,inj,Quad PF,6+ Mos 09/29/2018   Influenza,trivalent, recombinat, inj, PF 12/21/2013   Influenza-Unspecified  11/25/2012, 01/29/2015   PFIZER(Purple Top)SARS-COV-2 Vaccination 08/13/2019, 09/10/2019   PNEUMOCOCCAL CONJUGATE-20 07/30/2020   PPD Test 07/14/2010   Tdap 10/11/2011, 08/03/2021   Patient Active Problem List   Diagnosis Date Noted   Lynch syndrome 04/13/2013    Priority: High   Asthma, moderate persistent, well-controlled 12/27/2006    Priority: High   Major depressive disorder, recurrent, in full remission (HCC) 12/18/2009    Priority: Medium    Panic disorder without agoraphobia with panic attacks in partial remission 01/23/2008    Priority: Medium    Increased risk of breast cancer 10/31/2018   Family history of breast cancer 06/11/2016   Vitamin D deficiency 08/24/2012   ALLERGIC CONJUNCTIVITIS 12/23/2008   DERMATITIS, ATOPIC 12/23/2008   GERD 12/05/2007   Allergic rhinitis 12/27/2006    Past Medical History:  Diagnosis Date   Anxiety    Asthma    Family history of breast cancer    Fibroid    Genetic testing of female    MyRisk Positive   Increased risk of breast cancer 2014, 2018   riskscore=22.2% 6/18   Lynch syndrome    PMS2 pos 11/14, ATM VUS   Panic disorder     Past Surgical History:  Procedure Laterality Date   WISDOM TOOTH EXTRACTION      Family History  Problem Relation Age of Onset   Anxiety disorder Mother    Anxiety disorder Brother    Breast cancer Maternal Grandmother 40   Diabetes Maternal Grandfather    Breast cancer Maternal Aunt 12  Lynch positive   Breast cancer Other 70   Colon cancer Neg Hx    Stomach cancer Neg Hx     Social History   Social History Narrative   Not on file    Past Medical History, Surgical History, Social History, Family History, Problem List, Medications, and Allergies have been reviewed and updated if relevant.  Review of Systems: Pertinent positives are listed above.  Otherwise, a full 14 point review of systems has been done in full and it is negative except where it is noted  positive.  Objective:   BP 118/72   Pulse 84   Temp 98.2 F (36.8 C) (Oral)   Ht 5\' 4"  (1.626 m)   Wt 129 lb 6 oz (58.7 kg)   LMP 09/29/2022   SpO2 98%   BMI 22.21 kg/m  Ideal Body Weight: Weight in (lb) to have BMI = 25: 145.3 No results found.    10/14/2022    2:54 PM 08/03/2021    9:07 AM 08/07/2018    2:57 PM  Depression screen PHQ 2/9  Decreased Interest 0 0 0  Down, Depressed, Hopeless 0 0 0  PHQ - 2 Score 0 0 0  Altered sleeping 0    Tired, decreased energy 0    Change in appetite 0    Feeling bad or failure about yourself  1    Trouble concentrating 0    Moving slowly or fidgety/restless 0    Suicidal thoughts 0    PHQ-9 Score 1       GEN: well developed, well nourished, no acute distress Eyes: conjunctiva and lids normal, PERRLA, EOMI ENT: TM clear, nares clear, oral exam WNL Neck: supple, no lymphadenopathy, no thyromegaly, no JVD Pulm: clear to auscultation and percussion, respiratory effort normal CV: regular rate and rhythm, S1-S2, no murmur, rub or gallop, no bruits Chest: no scars, masses, no lumps BREAST: breast exam declined GI: soft, non-tender; no hepatosplenomegaly, masses; active bowel sounds all quadrants GU: GU exam declined Lymph: no cervical, axillary or inguinal adenopathy MSK: gait normal, muscle tone and strength WNL, no joint swelling, effusions, discoloration, crepitus  SKIN: clear, good turgor, color WNL, no rashes, lesions, or ulcerations Neuro: normal mental status, normal strength, sensation, and motion Psych: alert; oriented to person, place and time, normally interactive and not anxious or depressed in appearance.   All labs reviewed with patient. Results for orders placed or performed in visit on 10/14/22  IBC + Ferritin  Result Value Ref Range   Iron 155 (H) 42 - 145 ug/dL   Transferrin 914.7 829.5 - 360.0 mg/dL   Saturation Ratios 62.1 20.0 - 50.0 %   Ferritin 5.9 (L) 10.0 - 291.0 ng/mL   TIBC 404.6 250.0 - 450.0 mcg/dL   Hemoglobin H0Q  Result Value Ref Range   Hgb A1c MFr Bld 5.0 4.6 - 6.5 %  Basic metabolic panel  Result Value Ref Range   Sodium 137 135 - 145 mEq/L   Potassium 4.1 3.5 - 5.1 mEq/L   Chloride 102 96 - 112 mEq/L   CO2 27 19 - 32 mEq/L   Glucose, Bld 89 70 - 99 mg/dL   BUN 12 6 - 23 mg/dL   Creatinine, Ser 6.57 0.40 - 1.20 mg/dL   GFR 846.96 >29.52 mL/min   Calcium 9.1 8.4 - 10.5 mg/dL  CBC with Differential/Platelet  Result Value Ref Range   WBC 4.8 4.0 - 10.5 K/uL   RBC 4.08 3.87 - 5.11 Mil/uL  Hemoglobin 9.3 (L) 12.0 - 15.0 g/dL   HCT 91.4 (L) 78.2 - 95.6 %   MCV 75.9 (L) 78.0 - 100.0 fl   MCHC 30.1 30.0 - 36.0 g/dL   RDW 21.3 (H) 08.6 - 57.8 %   Platelets 214.0 150.0 - 400.0 K/uL   Neutrophils Relative % 69.7 43.0 - 77.0 %   Lymphocytes Relative 18.9 12.0 - 46.0 %   Monocytes Relative 6.0 3.0 - 12.0 %   Eosinophils Relative 4.3 0.0 - 5.0 %   Basophils Relative 1.1 0.0 - 3.0 %   Neutro Abs 3.4 1.4 - 7.7 K/uL   Lymphs Abs 0.9 0.7 - 4.0 K/uL   Monocytes Absolute 0.3 0.1 - 1.0 K/uL   Eosinophils Absolute 0.2 0.0 - 0.7 K/uL   Basophils Absolute 0.1 0.0 - 0.1 K/uL  VITAMIN D 25 Hydroxy (Vit-D Deficiency, Fractures)  Result Value Ref Range   VITD 26.83 (L) 30.00 - 100.00 ng/mL  LDL cholesterol, direct  Result Value Ref Range   Direct LDL 94.0 mg/dL  Hepatic function panel  Result Value Ref Range   Total Bilirubin 0.3 0.2 - 1.2 mg/dL   Bilirubin, Direct 0.1 0.0 - 0.3 mg/dL   Alkaline Phosphatase 68 39 - 117 U/L   AST 13 0 - 37 U/L   ALT 8 0 - 35 U/L   Total Protein 6.7 6.0 - 8.3 g/dL   Albumin 4.0 3.5 - 5.2 g/dL   No results found.  Assessment and Plan:     ICD-10-CM   1. Healthcare maintenance  Z00.00     2. Other iron deficiency anemia  D50.8 IBC + Ferritin    CBC with Differential/Platelet    3. Screening for diabetes mellitus  Z13.1 Hemoglobin A1c    Basic metabolic panel    4. Vitamin D deficiency  E55.9 VITAMIN D 25 Hydroxy (Vit-D Deficiency,  Fractures)    5. Mixed hyperlipidemia  E78.2 LDL cholesterol, direct    6. Encounter for long-term (current) use of medications  Z79.899 Hepatic function panel     She is basically doing pretty well.  Asthma is well-controlled.  Anxiety is doing well.  One of her best friends recently committed suicide.  I tried to counsel her the best that I could.  We will get all labs today.  Anticipate the need for iron orally.  Health Maintenance Exam: The patient's preventative maintenance and recommended screening tests for an annual wellness exam were reviewed in full today. Brought up to date unless services declined.  Counselled on the importance of diet, exercise, and its role in overall health and mortality. The patient's FH and SH was reviewed, including their home life, tobacco status, and drug and alcohol status.  Follow-up in 1 year for physical exam or additional follow-up below.  Disposition: No follow-ups on file.  No future appointments.   No orders of the defined types were placed in this encounter.  Medications Discontinued During This Encounter  Medication Reason   Mometasone Furoate (ASMANEX HFA) 100 MCG/ACT AERO Completed Course   Orders Placed This Encounter  Procedures   IBC + Ferritin   Hemoglobin A1c   Basic metabolic panel   CBC with Differential/Platelet   VITAMIN D 25 Hydroxy (Vit-D Deficiency, Fractures)   LDL cholesterol, direct    Signed,  Gloria Sneath T. Matalie Romberger, MD   Allergies as of 10/14/2022       Reactions   Amoxicillin    Itching (once), no rash Rapid heart rate   Chicken  Meat (diagnostic) Other (See Comments)   Throat feels tight and GERD   Dexilant [dexlansoprazole] Other (See Comments)   Throat feels tight        Medication List        Accurate as of October 14, 2022 11:59 PM. If you have any questions, ask your nurse or doctor.          STOP taking these medications    Asmanex HFA 100 MCG/ACT Aero Generic drug: Mometasone  Furoate Stopped by: Karleen Hampshire Stokely Jeancharles       TAKE these medications    albuterol (2.5 MG/3ML) 0.083% nebulizer solution Commonly known as: PROVENTIL Take 3 mLs (2.5 mg total) by nebulization every 6 (six) hours as needed for wheezing or shortness of breath.   albuterol 108 (90 Base) MCG/ACT inhaler Commonly known as: Ventolin HFA USE 2 PUFFS EVERY 4 HOURS AS NEEDED FOR WHEEZING   escitalopram 20 MG tablet Commonly known as: LEXAPRO TAKE 1/2 TABLET BY MOUTH DAILY   hydrOXYzine 10 MG tablet Commonly known as: ATARAX Take 1 tablet (10 mg total) by mouth every 6 (six) hours as needed. Anxiety   loratadine 10 MG tablet Commonly known as: Claritin Take 1 tablet (10 mg total) by mouth daily.   mupirocin ointment 2 % Commonly known as: BACTROBAN SMARTSIG:1 Application Topical 2-3 Times Daily   nystatin 100000 UNIT/ML suspension Commonly known as: MYCOSTATIN Take 5 mLs (500,000 Units total) by mouth 4 (four) times daily.   propylthiouracil 50 MG tablet Commonly known as: PTU Take by mouth. Once a day.

## 2022-10-14 ENCOUNTER — Encounter: Payer: Self-pay | Admitting: Family Medicine

## 2022-10-14 ENCOUNTER — Ambulatory Visit (INDEPENDENT_AMBULATORY_CARE_PROVIDER_SITE_OTHER): Payer: Managed Care, Other (non HMO) | Admitting: Family Medicine

## 2022-10-14 VITALS — BP 118/72 | HR 84 | Temp 98.2°F | Ht 64.0 in | Wt 129.4 lb

## 2022-10-14 DIAGNOSIS — E782 Mixed hyperlipidemia: Secondary | ICD-10-CM

## 2022-10-14 DIAGNOSIS — E559 Vitamin D deficiency, unspecified: Secondary | ICD-10-CM | POA: Diagnosis not present

## 2022-10-14 DIAGNOSIS — Z0000 Encounter for general adult medical examination without abnormal findings: Secondary | ICD-10-CM

## 2022-10-14 DIAGNOSIS — Z131 Encounter for screening for diabetes mellitus: Secondary | ICD-10-CM

## 2022-10-14 DIAGNOSIS — Z79899 Other long term (current) drug therapy: Secondary | ICD-10-CM | POA: Diagnosis not present

## 2022-10-14 DIAGNOSIS — D508 Other iron deficiency anemias: Secondary | ICD-10-CM | POA: Diagnosis not present

## 2022-10-15 ENCOUNTER — Encounter: Payer: Self-pay | Admitting: Family Medicine

## 2022-10-15 LAB — CBC WITH DIFFERENTIAL/PLATELET
Basophils Absolute: 0.1 10*3/uL (ref 0.0–0.1)
Basophils Relative: 1.1 % (ref 0.0–3.0)
Eosinophils Absolute: 0.2 10*3/uL (ref 0.0–0.7)
Eosinophils Relative: 4.3 % (ref 0.0–5.0)
HCT: 31 % — ABNORMAL LOW (ref 36.0–46.0)
Hemoglobin: 9.3 g/dL — ABNORMAL LOW (ref 12.0–15.0)
Lymphocytes Relative: 18.9 % (ref 12.0–46.0)
Lymphs Abs: 0.9 10*3/uL (ref 0.7–4.0)
MCHC: 30.1 g/dL (ref 30.0–36.0)
MCV: 75.9 fL — ABNORMAL LOW (ref 78.0–100.0)
Monocytes Absolute: 0.3 10*3/uL (ref 0.1–1.0)
Monocytes Relative: 6 % (ref 3.0–12.0)
Neutro Abs: 3.4 10*3/uL (ref 1.4–7.7)
Neutrophils Relative %: 69.7 % (ref 43.0–77.0)
Platelets: 214 10*3/uL (ref 150.0–400.0)
RBC: 4.08 Mil/uL (ref 3.87–5.11)
RDW: 22.5 % — ABNORMAL HIGH (ref 11.5–15.5)
WBC: 4.8 10*3/uL (ref 4.0–10.5)

## 2022-10-15 LAB — HEMOGLOBIN A1C: Hgb A1c MFr Bld: 5 % (ref 4.6–6.5)

## 2022-10-15 LAB — HEPATIC FUNCTION PANEL
ALT: 8 U/L (ref 0–35)
AST: 13 U/L (ref 0–37)
Albumin: 4 g/dL (ref 3.5–5.2)
Alkaline Phosphatase: 68 U/L (ref 39–117)
Bilirubin, Direct: 0.1 mg/dL (ref 0.0–0.3)
Total Bilirubin: 0.3 mg/dL (ref 0.2–1.2)
Total Protein: 6.7 g/dL (ref 6.0–8.3)

## 2022-10-15 LAB — BASIC METABOLIC PANEL
BUN: 12 mg/dL (ref 6–23)
CO2: 27 meq/L (ref 19–32)
Calcium: 9.1 mg/dL (ref 8.4–10.5)
Chloride: 102 meq/L (ref 96–112)
Creatinine, Ser: 0.71 mg/dL (ref 0.40–1.20)
GFR: 102.6 mL/min (ref >60.00)
Glucose, Bld: 89 mg/dL (ref 70–99)
Potassium: 4.1 meq/L (ref 3.5–5.1)
Sodium: 137 meq/L (ref 135–145)

## 2022-10-15 LAB — IBC + FERRITIN
Ferritin: 5.9 ng/mL — ABNORMAL LOW (ref 10.0–291.0)
Iron: 155 ug/dL — ABNORMAL HIGH (ref 42–145)
Saturation Ratios: 38.3 % (ref 20.0–50.0)
TIBC: 404.6 ug/dL (ref 250.0–450.0)
Transferrin: 289 mg/dL (ref 212.0–360.0)

## 2022-10-15 LAB — VITAMIN D 25 HYDROXY (VIT D DEFICIENCY, FRACTURES): VITD: 26.83 ng/mL — ABNORMAL LOW (ref 30.00–100.00)

## 2022-10-15 LAB — LDL CHOLESTEROL, DIRECT: Direct LDL: 94 mg/dL

## 2022-11-13 ENCOUNTER — Encounter: Payer: Self-pay | Admitting: Family Medicine

## 2022-11-15 NOTE — Telephone Encounter (Signed)
Can you help set up a TOC office visit for Gloria Rush "[employer] with me.

## 2022-11-17 ENCOUNTER — Other Ambulatory Visit: Payer: Self-pay | Admitting: Family Medicine

## 2022-11-17 NOTE — Telephone Encounter (Signed)
Last office visit 10/14/2022 for CPE.  Last refilled 09/03/2022 for #50 with 2 refills.  Pharmacy is asking for 90 day supply #150 with 1 refill.  Ok to refill??

## 2022-11-22 IMAGING — MG MM DIGITAL DIAGNOSTIC UNILAT*R* W/ TOMO W/ CAD
4 series · 4 of 12 positions shown · non-contrast
Comparison: None Available.

CLINICAL DATA: Screening recall for a possible right breast mass.

EXAM:
DIGITAL DIAGNOSTIC UNILATERAL RIGHT MAMMOGRAM WITH TOMOSYNTHESIS AND
CAD; ULTRASOUND RIGHT BREAST LIMITED
TECHNIQUE: Right digital diagnostic mammography and breast tomosynthesis was
performed. The images were evaluated with computer-aided detection.;
Targeted ultrasound examination of the right breast was performed

[R CC synth-2D]
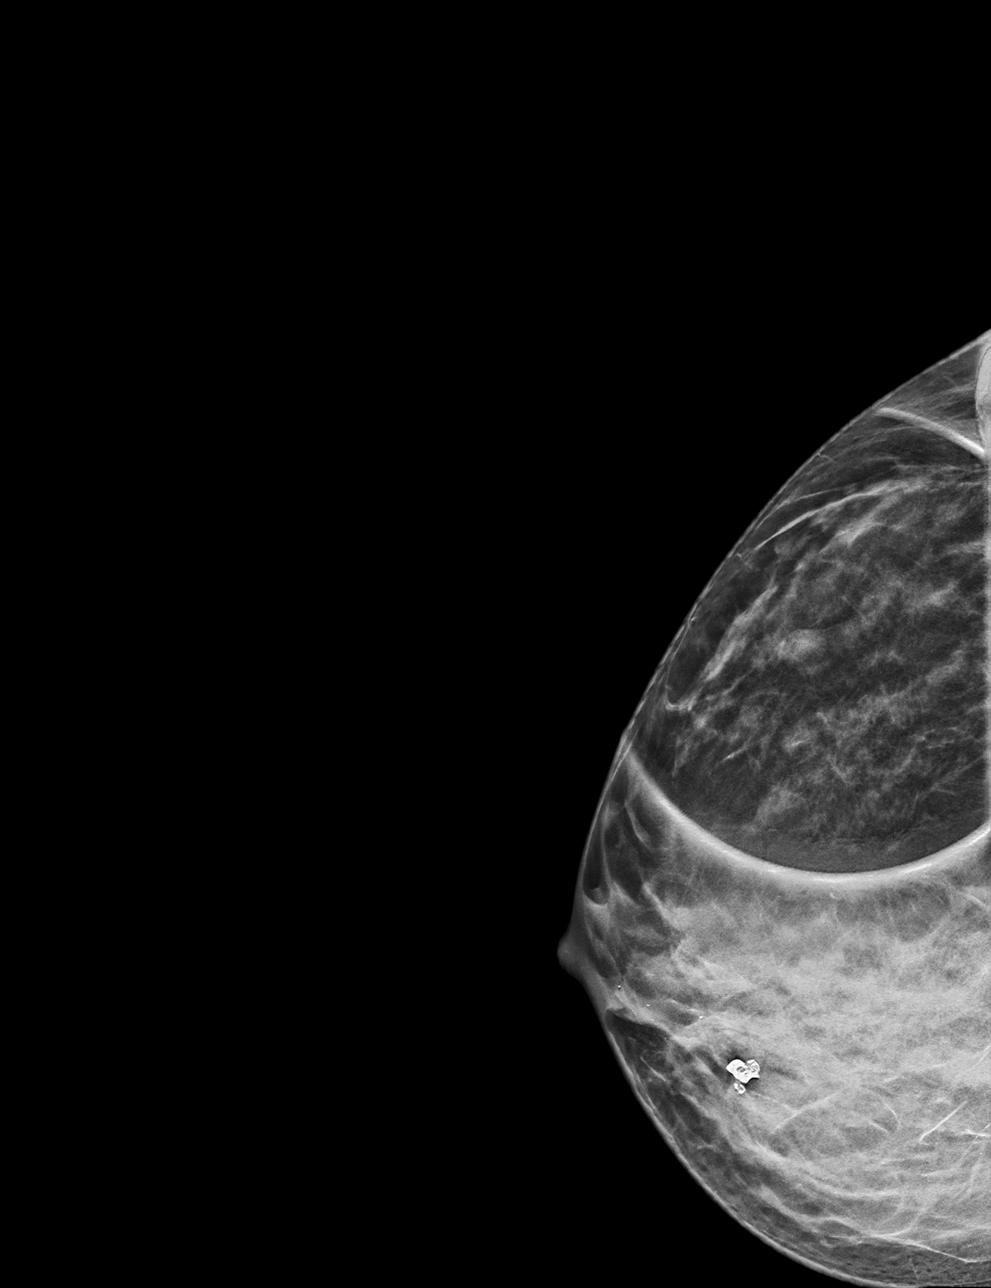

[R MLO synth-2D]
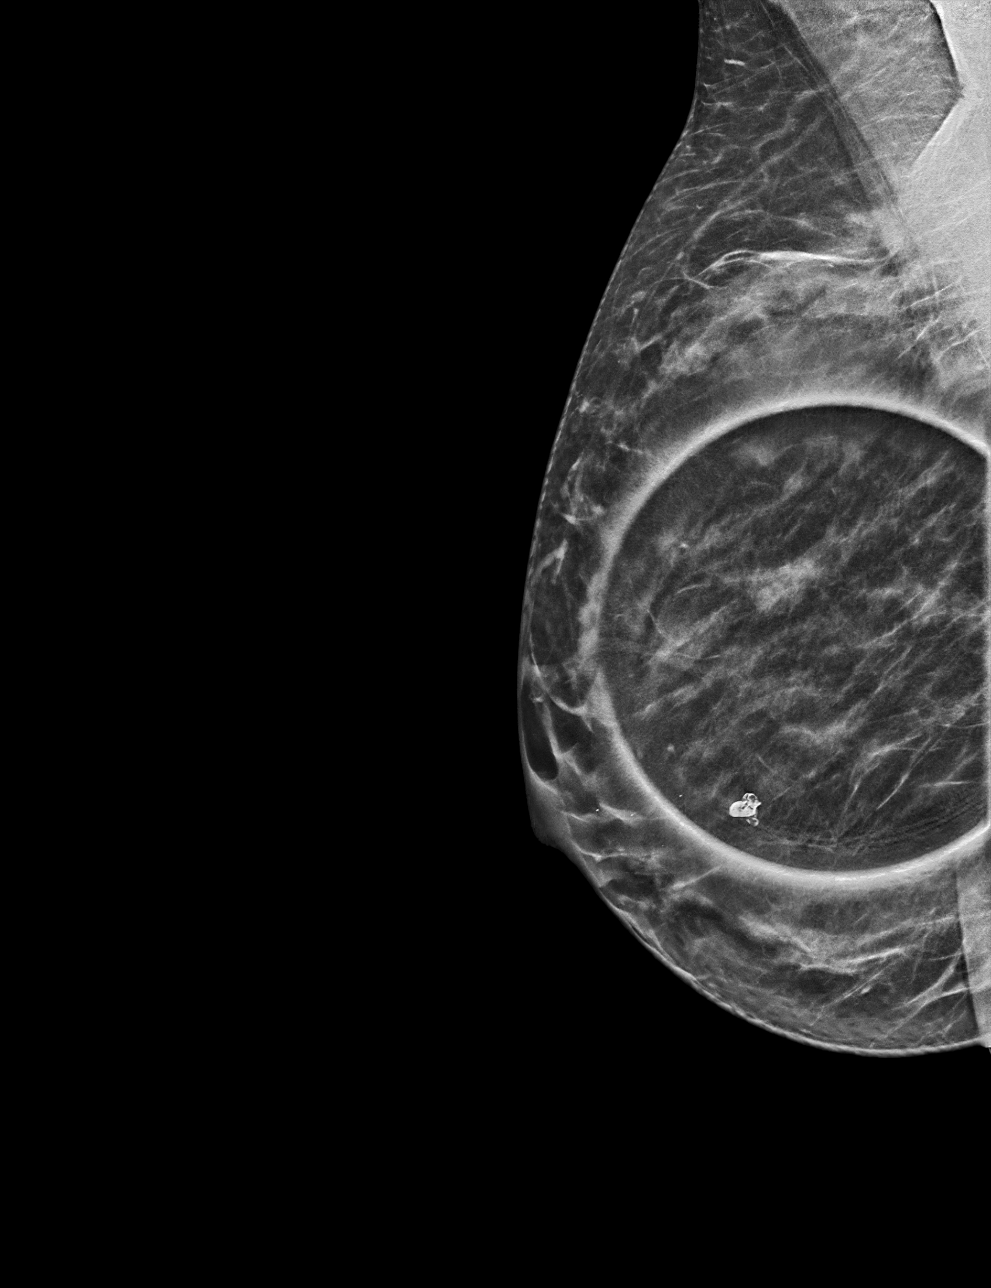

[R MLO tomo · tomo slice 25/49.0]
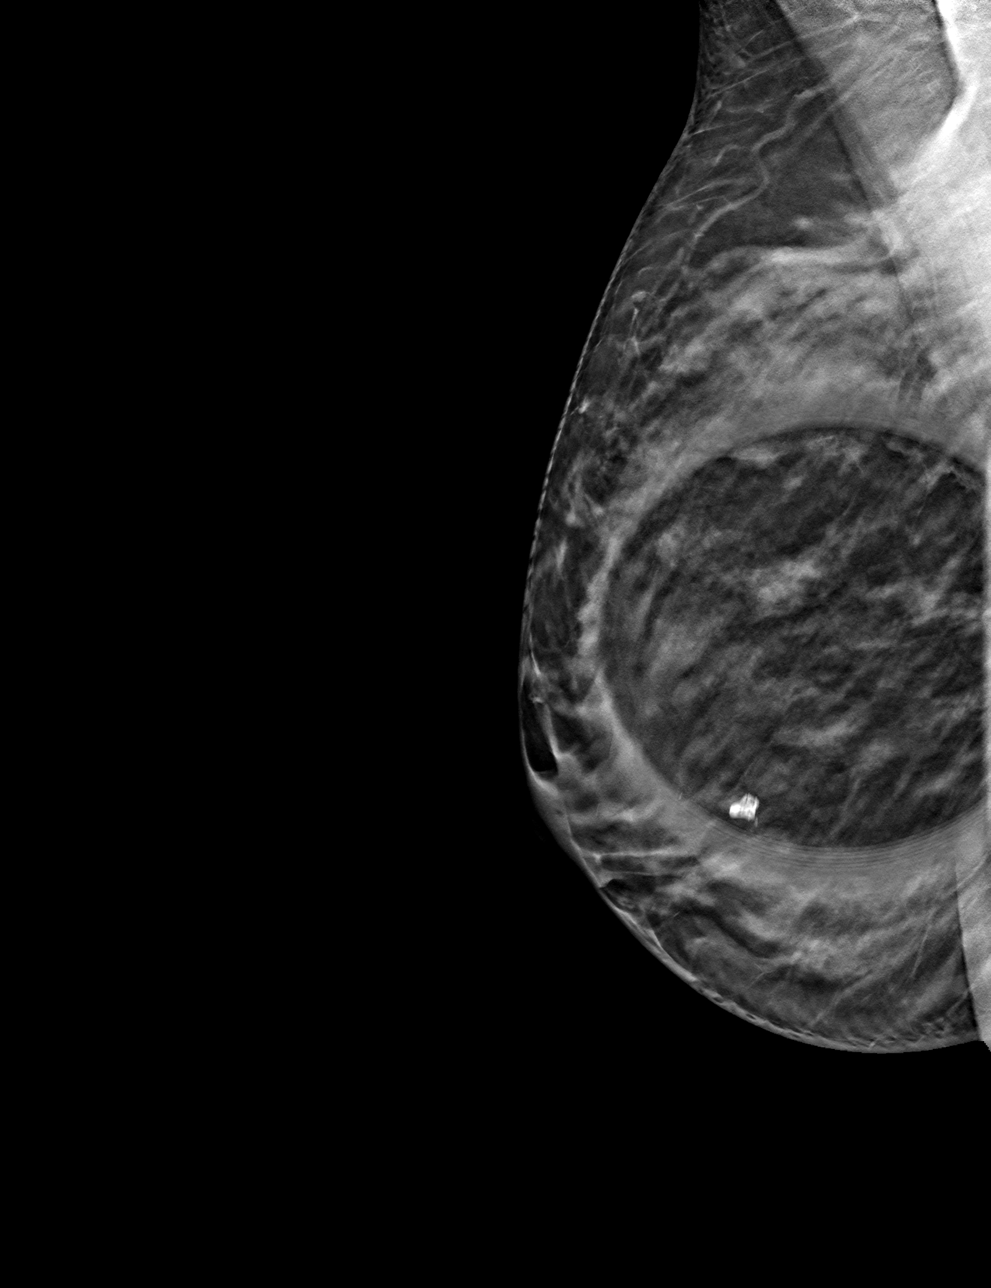

[R CC tomo · tomo slice 21/42.0]
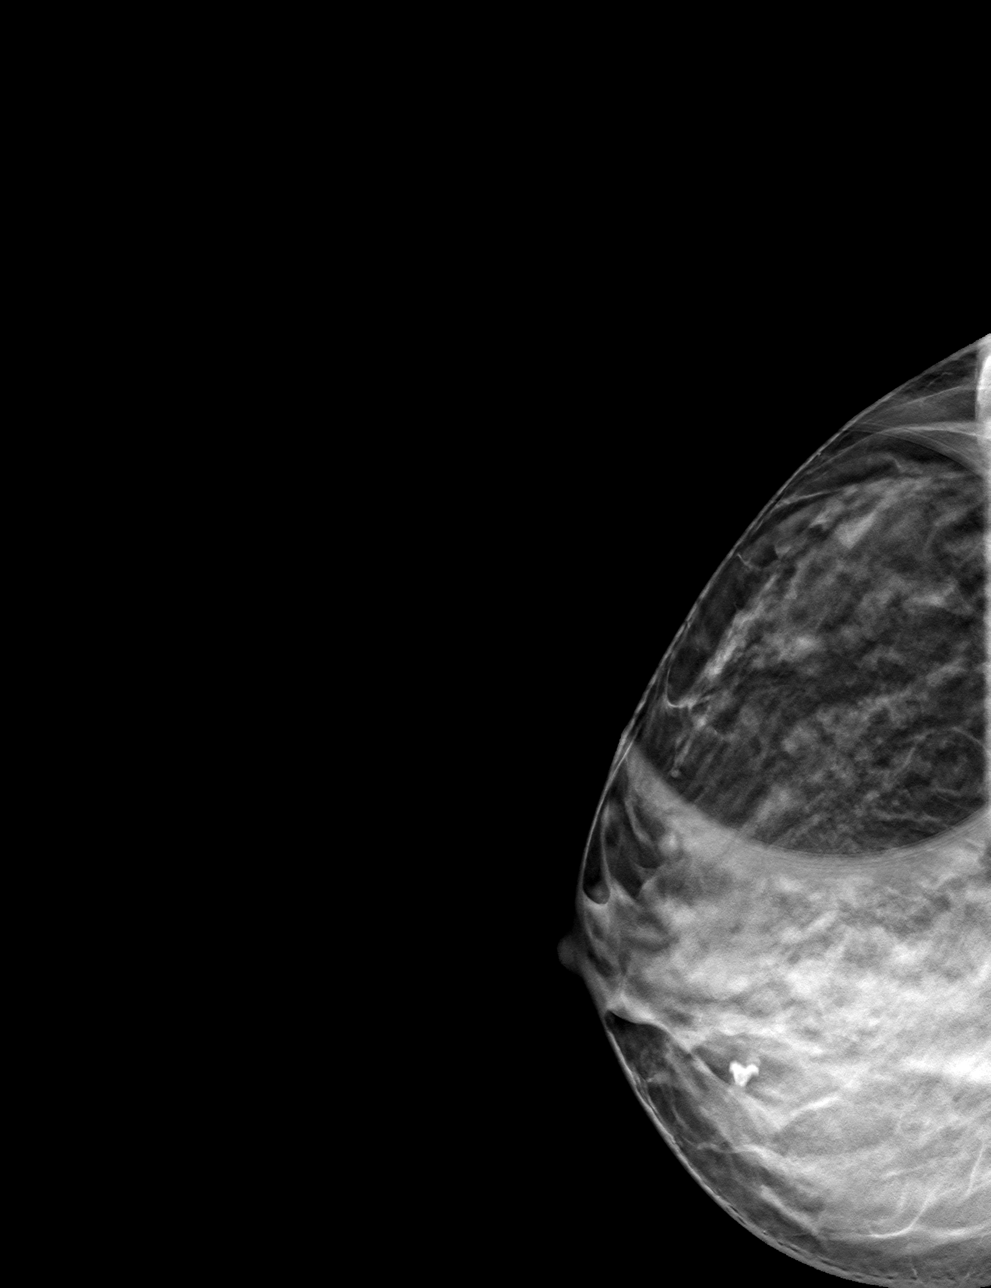

[4 of 12 positions shown; findings below may reference images not displayed]

ACR Breast Density Category c: The breast tissue is heterogeneously
dense, which may obscure small masses.
FINDINGS: Spot compression tomosynthesis images through the lateral aspect of
the right breast demonstrates an obscured oval mass measuring
approximately 7-8 mm.

Ultrasound targeted to the right breast at 9 o'clock, 6 cm from the
nipple demonstrates an anechoic oval circumscribed mass with
increased posterior acoustic enhancement measuring 7 x 5 x 5 mm.
IMPRESSION: The mass in the right breast at 9 o'clock corresponds with a benign
cyst.

RECOMMENDATION:
Screening mammogram in one year.(Code:BX-I-X1V)

I have discussed the findings and recommendations with the patient.
If applicable, a reminder letter will be sent to the patient
regarding the next appointment.

BI-RADS CATEGORY  2: Benign.

## 2023-01-18 ENCOUNTER — Telehealth: Payer: Self-pay | Admitting: Obstetrics and Gynecology

## 2023-01-18 NOTE — Telephone Encounter (Signed)
 LM for pt with amended report for [facility] testing, received 12/24. 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 This does not change dx, mgmt, cancer risks. Seems more informational. Hard copy mailed to pt for her records.

## 2023-02-26 ENCOUNTER — Telehealth: Payer: Self-pay | Admitting: Obstetrics and Gynecology

## 2023-02-26 DIAGNOSIS — D219 Benign neoplasm of connective and other soft tissue, unspecified: Secondary | ICD-10-CM

## 2023-02-26 DIAGNOSIS — R102 Pelvic and perineal pain unspecified side: Secondary | ICD-10-CM

## 2023-02-26 NOTE — Telephone Encounter (Signed)
 Pt reached out to me today via IM stating she was having lower back pain and BLQ pain for the past week. Has been taking tylenol but her low back is still hurting. Sx have happened before but usually resolve by now. No fever, GI sx. Hx of 4/5 cm leio and menorrhagia,  and ovar cysts. Pt with hx of Lynch syndrome, neg EMB 7/24. Will schedule Gyn u/s to assess further and f/u. Can cont to take tylenol/heating pad.

## 2023-02-27 ENCOUNTER — Other Ambulatory Visit: Payer: Self-pay | Admitting: Family Medicine

## 2023-02-28 ENCOUNTER — Ambulatory Visit (INDEPENDENT_AMBULATORY_CARE_PROVIDER_SITE_OTHER): Payer: Managed Care, Other (non HMO) | Admitting: Internal Medicine

## 2023-02-28 VITALS — BP 100/68 | HR 83 | Temp 98.9°F | Ht 64.0 in | Wt 131.0 lb

## 2023-02-28 DIAGNOSIS — R102 Pelvic and perineal pain: Secondary | ICD-10-CM | POA: Insufficient documentation

## 2023-02-28 NOTE — Progress Notes (Signed)
 Subjective:    Patient ID: Gloria Rush, female    DOB: [birth year], 48 y.o.   MRN: [id number]  HPI Here due to low back pain With husband and son  Had some back pain and pelvic cramps at first Thought it might be cyst---just after her cycle (had twice last month--happens occasionally) Started 8 days ago--and went for a week  Only just starting to feel better now  Pelvic pain is better--able to move around pretty normally (hard to walki at first) Also some left paraspinal pain  Used tylenol --not much help Last night was first night she could get comfortable  Current Outpatient Medications on File Prior to Visit  Medication Sig Dispense Refill   albuterol (VENTOLIN HFA) 108 (90 Base) MCG/ACT inhaler USE 2 PUFFS EVERY 4 HOURS AS NEEDED FOR WHEEZING 18 g 2   escitalopram (LEXAPRO) 20 MG tablet TAKE 1/2 TABLET BY MOUTH DAILY 45 tablet 1   hydrOXYzine (ATARAX) 10 MG tablet TAKE 1 TABLET (10 MG TOTAL) BY MOUTH EVERY 6 (SIX) HOURS AS NEEDED. ANXIETY 150 tablet 1   loratadine (CLARITIN) 10 MG tablet Take 1 tablet (10 mg total) by mouth daily. 90 tablet 1   mupirocin ointment (BACTROBAN) 2 % SMARTSIG:1 Application Topical 2-3 Times Daily 22 g 3   nystatin (MYCOSTATIN) 100000 UNIT/ML suspension Take 5 mLs (500,000 Units total) by mouth 4 (four) times daily. 473 mL 0   propylthiouracil (PTU) 50 MG tablet Take by mouth. Once a day.     No current facility-administered medications on file prior to visit.    Allergies  Allergen Reactions   Amoxicillin     Itching (once), no rash Rapid heart rate   Chicken Meat (Diagnostic) Other (See Comments)    Throat feels tight and GERD   Dexilant [Dexlansoprazole] Other (See Comments)    Throat feels tight    Past Medical History:  Diagnosis Date   Anxiety    Asthma    Family history of breast cancer    Fibroid    Genetic testing of female    MyRisk Positive   Increased risk of breast cancer 2014, 2018   riskscore=22.2% 6/18   Lynch  syndrome    PMS2 pos 11/14, ATM VUS   Panic disorder     Past Surgical History:  Procedure Laterality Date   WISDOM TOOTH EXTRACTION      Family History  Problem Relation Age of Onset   Anxiety disorder Mother    Anxiety disorder Brother    Breast cancer Maternal Grandmother 40   Diabetes Maternal Grandfather    Breast cancer Maternal Aunt 43       Lynch positive   Breast cancer Other 83   Colon cancer Neg Hx    Stomach cancer Neg Hx     Social History   Socioeconomic History   Marital status: Married    Spouse name: Not on file   Number of children: Not on file   Years of education: Not on file   Highest education level: Some college, no degree  Occupational History   Not on file  Tobacco Use   Smoking status: Never   Smokeless tobacco: Never  Vaping Use   Vaping status: Never Used  Substance and Sexual Activity   Alcohol use: No    Alcohol/week: 0.0 standard drinks of alcohol   Drug use: No   Sexual activity: Yes    Partners: Male    Birth control/protection: None  Other Topics Concern  Not on file  Social History Narrative   Not on file   Social Drivers of Health   Financial Resource Strain: Low Risk  (02/28/2023)   Overall Financial Resource Strain (CARDIA)    Difficulty of Paying Living Expenses: Not hard at all  Food Insecurity: No Food Insecurity (02/28/2023)   Hunger Vital Sign    Worried About Running Out of Food in the Last Year: Never true    Ran Out of Food in the Last Year: Never true  Transportation Needs: No Transportation Needs (02/28/2023)   PRAPARE - Administrator, Civil Service (Medical): No    Lack of Transportation (Non-Medical): No  Physical Activity: Insufficiently Active (02/28/2023)   Exercise Vital Sign    Days of Exercise per Week: 2 days    Minutes of Exercise per Session: 20 min  Stress: No Stress Concern Present (02/28/2023)   [employer] of Occupational Health - Occupational Stress Questionnaire     Feeling of Stress : Not at all  Social Connections: Unknown (02/28/2023)   Social Connection and Isolation Panel [NHANES]    Frequency of Communication with Friends and Family: Once a week    Frequency of Social Gatherings with Friends and Family: Patient declined    Attends Religious Services: More than 4 times per year    Active Member of [employer] or Organizations: Yes    Attends Engineer, structural: More than 4 times per year    Marital Status: Married  Catering manager Violence: Not on file   Review of Systems No fever No dysuria or hematuria    Objective:   Physical Exam Constitutional:      Appearance: Normal appearance.  Abdominal:     Palpations: Abdomen is soft.     Tenderness: There is no guarding or rebound.     Comments: Bilateral pelvic tenderness--worse suprapubic  Musculoskeletal:     Comments: No spine tenderness SLR negative   Neurological:     Mental Status: She is alert.     Comments: No leg weakness            Assessment & Plan:

## 2023-02-28 NOTE — Assessment & Plan Note (Signed)
 Doesn't seem to be in back--likely the fibroid Discussed the natural history of decline with menopause--but she may have a way to go Probably needs the repeat ultrasound and may want to consider embolization

## 2023-03-01 ENCOUNTER — Encounter: Payer: Self-pay | Admitting: Obstetrics and Gynecology

## 2023-03-16 ENCOUNTER — Ambulatory Visit
Admission: RE | Admit: 2023-03-16 | Discharge: 2023-03-16 | Disposition: A | Discharge status: Home / Self Care | Source: Ambulatory Visit | Attending: Obstetrics and Gynecology | Admitting: Obstetrics and Gynecology

## 2023-03-16 DIAGNOSIS — D219 Benign neoplasm of connective and other soft tissue, unspecified: Secondary | ICD-10-CM | POA: Diagnosis present

## 2023-03-16 DIAGNOSIS — R102 Pelvic and perineal pain: Secondary | ICD-10-CM | POA: Insufficient documentation

## 2023-03-23 ENCOUNTER — Other Ambulatory Visit: Payer: Managed Care, Other (non HMO)

## 2023-03-23 ENCOUNTER — Other Ambulatory Visit: Payer: Self-pay | Admitting: Family Medicine

## 2023-03-23 NOTE — Telephone Encounter (Signed)
 Last office visit 02/17/20205 with Dr. Alphonsus Sias for pelvic pain. Last office visit 11/17/2022 for #150 with 1 refill.  Pharmacy is asking for 90 day (#369 tablets).  Please advise if this is appropriate.

## 2023-03-29 ENCOUNTER — Encounter: Payer: Self-pay | Admitting: Obstetrics and Gynecology

## 2023-04-04 ENCOUNTER — Other Ambulatory Visit: Payer: Self-pay | Admitting: Family Medicine

## 2023-06-06 ENCOUNTER — Encounter: Payer: Self-pay | Admitting: Family Medicine

## 2023-06-23 ENCOUNTER — Encounter: Payer: Self-pay | Admitting: Family Medicine

## 2023-08-08 ENCOUNTER — Other Ambulatory Visit: Payer: Self-pay | Admitting: Obstetrics and Gynecology

## 2023-08-08 DIAGNOSIS — Z1231 Encounter for screening mammogram for malignant neoplasm of breast: Secondary | ICD-10-CM

## 2023-08-09 ENCOUNTER — Encounter: Payer: Self-pay | Admitting: Family Medicine

## 2023-08-09 MED ORDER — ESCITALOPRAM OXALATE 20 MG PO TABS
10.0000 mg | ORAL_TABLET | Freq: Every day | ORAL | 0 refills | Status: DC
Start: 2023-08-09 — End: 2023-10-26

## 2023-08-23 ENCOUNTER — Ambulatory Visit
Admission: RE | Admit: 2023-08-23 | Discharge: 2023-08-23 | Disposition: A | Discharge status: Home / Self Care | Source: Ambulatory Visit | Attending: Obstetrics and Gynecology | Admitting: Obstetrics and Gynecology

## 2023-08-23 ENCOUNTER — Other Ambulatory Visit: Payer: Self-pay | Admitting: Family Medicine

## 2023-08-23 DIAGNOSIS — Z1231 Encounter for screening mammogram for malignant neoplasm of breast: Secondary | ICD-10-CM | POA: Diagnosis present

## 2023-08-25 ENCOUNTER — Ambulatory Visit: Payer: Self-pay | Admitting: Obstetrics and Gynecology

## 2023-09-17 NOTE — Progress Notes (Deleted)
     Jennalee Greaves T. Cohan Stipes, MD, CAQ Sports Medicine [facility] at [facility] [address] [place], [address]  Phone: [phone]  FAX: [phone]  CYERA BALBONI - 48 y.o. female  MRN [id number]  Date of Birth: 1975/12/23  Date: 09/19/2023  PCP: Watt Mirza, MD  Referral: Watt Mirza, MD  No chief complaint on file.  Virtual Visit via Video Note:  I connected with  Zeenat J Wiegman on 09/19/2023 12:00 PM EDT by a video enabled telemedicine application and verified that I am speaking with the correct person using two identifiers.   Location patient: home computer, tablet, or smartphone Location provider: work or home office Consent: Verbal consent directly obtained from [facility]. Persons participating in the virtual visit: patient, provider  I discussed the limitations of evaluation and management by telemedicine and the availability of in person appointments. The patient expressed understanding and agreed to proceed.  No chief complaint on file.   History of Present Illness:  This is a very well-known patient who I have known for many years who has some significant anxiety that has been well-controlled with psychotropic medications. She is currently on Lexapro  20 mg, hydroxyzine  10 mg p.o. 4 times daily as needed anxiety.  Discussed the use of AI scribe software for clinical note transcription with the patient, who gave verbal consent to proceed.  History of Present Illness     Review of Systems as above: See pertinent positives and pertinent negatives per HPI No acute distress verbally   Observations/Objective/Exam:  An attempt was made to discern vital signs over the phone and per patient if applicable and possible.   General:    Alert, Oriented, appears well and in no acute distress  Pulmonary:     On inspection no signs of respiratory distress.  Psych / Neurological:     Pleasant and cooperative.  Assessment and  Plan:  No diagnosis found.   I discussed the assessment and treatment plan with the patient. The patient was provided an opportunity to ask questions and all were answered. The patient agreed with the plan and demonstrated an understanding of the instructions.   The patient was advised to call back or seek an in-person evaluation if the symptoms worsen or if the condition fails to improve as anticipated.  Follow-up: prn unless noted otherwise below No follow-ups on file.  No orders of the defined types were placed in this encounter.  No orders of the defined types were placed in this encounter.   Signed,  Mirza DASEN. Gabrella Stroh, MD

## 2023-09-19 ENCOUNTER — Ambulatory Visit: Admitting: Family Medicine

## 2023-09-19 DIAGNOSIS — F410 Panic disorder [episodic paroxysmal anxiety] without agoraphobia: Secondary | ICD-10-CM

## 2023-09-19 DIAGNOSIS — F3342 Major depressive disorder, recurrent, in full remission: Secondary | ICD-10-CM

## 2023-09-29 ENCOUNTER — Encounter: Payer: Self-pay | Admitting: Family Medicine

## 2023-09-30 MED ORDER — MUPIROCIN 2 % EX OINT: TOPICAL_OINTMENT | CUTANEOUS | 3 refills | Status: AC

## 2023-10-23 NOTE — Progress Notes (Signed)
 "    Gloria Rush T. Lisandra Mathisen, MD, CAQ Sports Medicine [facility] at [facility] [address] [place], [address]  Phone: [phone]  FAX: [phone]  Gloria Rush - 48 y.o. female  MRN [id number]  Date of Birth: [birth year]  Date: 10/26/2023  PCP: Watt Mirza, MD  Referral: Watt Mirza, MD  Chief Complaint  Patient presents with   Annual Exam   Patient Care Team: Watt Mirza, MD as PCP - General Subjective:   Gloria Rush is a 48 y.o. pleasant patient who presents with the following:  Discussed the use of AI scribe software for clinical note transcription with the patient, who gave verbal consent to proceed.  History of Present Illness Gloria Rush is a 48 year old female who presents for an annual physical exam.  She experiences some mood swings, which she attributes to hormonal changes associated with perimenopause. Her anxiety is managed with Lexapro  and hydroxyzine .  She has a history of anemia and takes Flintstone vitamins with iron due to an allergy to vitamin C in adult multivitamins, which causes mouth ulcers. Pure iron supplements lead to constipation and stomach discomfort. She experiences fatigue during the day and is unsure if it is related to her anemia or perimenopause. Her menstrual cycles have become irregular, ranging from 21 to 32 days, which she believes may be affecting her anemia due to increased blood loss.  She has a history of asthma but has not used her inhaler for almost a year. She experiences occasional chest aches, which she attributes to environmental factors like ragweed. She is not on any maintenance asthma medication currently.  She has a history of thyroid  nodules, which have been stable for 15 years. She takes thyroid  medication and reports that her T4 levels are consistently low due to the nodules producing a small amount of hormone.  She has Lynch syndrome, which increases her risk for colon  cancer. She has Lynch syndrome, which increases her risk for colon cancer, and has not had a colonoscopy recently.  She is physically active, engaging in walking for 30 to 40 minutes, although she has recently fallen off this routine. She maintains an ideal body weight and is mindful of her posture, especially when working at a desk. She does not smoke, use drugs, or consume alcohol, adhering strictly to her medication guidelines. She lives in a safe neighborhood where her child can play outside for extended periods.    Health Maintenance Summary Reviewed and updated, unless pt declines services.  Tobacco History Reviewed. Non-smoker Alcohol: No concerns, no excessive use Exercise Habits: Some activity, rec at least 30 mins 5 times a week -roughly 5 days a week STD concerns: none Drug Use: None Lumps or breast concerns: no  Colonoscopy overdue COVID booster Flu shot  She has a history of anxiety and is been fairly well-controlled on Lexapro  20 mg and she does take some hydroxyzine  on an as needed basis, relatively frequently and she has good anxiety relief from this.    Health Maintenance  Topic Date Due   Hepatitis B Vaccines 19-59 Average Risk (1 of 3 - 19+ 3-dose series) Never done   Colonoscopy  03/30/2017   COVID-19 Vaccine (3 - Pfizer risk series) 10/08/2019   Influenza Vaccine  04/10/2024 (Originally 08/12/2023)   Cervical Cancer Screening (HPV/Pap Cotest)  12/10/2024   Mammogram  08/22/2025   DTaP/Tdap/Td (3 - Td or Tdap) 08/04/2031   Pneumococcal Vaccine  Completed   Hepatitis C Screening  Completed   HIV Screening  Completed   HPV VACCINES  Aged Out   Meningococcal B Vaccine  Aged Out    Immunization History  Administered Date(s) Administered   Influenza Split 12/24/2010   Influenza Whole 12/27/2006, 10/10/2007, 11/19/2009   Influenza, Quadrivalent, Recombinant, Inj, Pf 11/19/2015, 11/26/2016   Influenza,inj,Quad PF,6+ Mos 09/29/2018   Influenza,trivalent,  recombinat, inj, PF 12/21/2013   Influenza-Unspecified 11/25/2012, 01/29/2015   PFIZER(Purple Top)SARS-COV-2 Vaccination 08/13/2019, 09/10/2019   PNEUMOCOCCAL CONJUGATE-20 07/30/2020   PPD Test 07/14/2010   Tdap 10/11/2011, 08/03/2021   Patient Active Problem List   Diagnosis Date Noted   Lynch syndrome 04/13/2013    Priority: High   Asthma, moderate persistent, well-controlled 12/27/2006    Priority: High   Major depressive disorder, recurrent, in full remission 12/18/2009    Priority: Medium    Panic disorder without agoraphobia with panic attacks in partial remission 01/23/2008    Priority: Medium    ALLERGIC CONJUNCTIVITIS 12/23/2008    Priority: Low   DERMATITIS, ATOPIC 12/23/2008    Priority: Low   Allergic rhinitis 12/27/2006    Priority: Low   Increased risk of breast cancer 10/31/2018   Family history of breast cancer 06/11/2016   Vitamin D  deficiency 08/24/2012   GERD 12/05/2007    Past Medical History:  Diagnosis Date   Anxiety    Asthma    Family history of breast cancer    Fibroid    Genetic testing of female    MyRisk Positive   Increased risk of breast cancer 2014, 2018   riskscore=22.2% 6/18   Lynch syndrome    PMS2 pos 11/14, ATM VUS   Panic disorder     Past Surgical History:  Procedure Laterality Date   WISDOM TOOTH EXTRACTION      Family History  Problem Relation Age of Onset   Anxiety disorder Mother    Anxiety disorder Brother    Breast cancer Maternal Grandmother 40   Diabetes Maternal Grandfather    Breast cancer Maternal Aunt 40       Lynch positive   Breast cancer Other 56   Colon cancer Neg Hx    Stomach cancer Neg Hx     Social History   Social History Narrative   Not on file    Past Medical History, Surgical History, Social History, Family History, Problem List, Medications, and Allergies have been reviewed and updated if relevant.  Review of Systems: Pertinent positives are listed above.  Otherwise, a full 14 point  review of systems has been done in full and it is negative except where it is noted positive.  Objective:   BP 100/62   Pulse 77   Temp 99.3 F (37.4 C) (Temporal)   Ht 5' 4 (1.626 m)   Wt 134 lb 2 oz (60.8 kg)   LMP 10/07/2023   SpO2 98%   BMI 23.02 kg/m  Ideal Body Weight: Weight in (lb) to have BMI = 25: 145.3 No results found.    10/26/2023    3:59 PM 10/14/2022    2:54 PM 08/03/2021    9:07 AM 08/07/2018    2:57 PM  Depression screen PHQ 2/9  Decreased Interest 0 0 0 0  Down, Depressed, Hopeless 0 0 0 0  PHQ - 2 Score 0 0 0 0  Altered sleeping  0    Tired, decreased energy  0    Change in appetite  0    Feeling bad or failure about yourself   1  Trouble concentrating  0    Moving slowly or fidgety/restless  0    Suicidal thoughts  0    PHQ-9 Score  1       GEN: well developed, well nourished, no acute distress Eyes: conjunctiva and lids normal, PERRLA, EOMI ENT: TM clear, nares clear, oral exam WNL Neck: supple, no lymphadenopathy, no thyromegaly, no JVD Pulm: clear to auscultation and percussion, respiratory effort normal CV: regular rate and rhythm, S1-S2, no murmur, rub or gallop, no bruits Chest: no scars, masses, no lumps BREAST: breast exam declined GI: soft, non-tender; no hepatosplenomegaly, masses; active bowel sounds all quadrants GU: GU exam declined Lymph: no cervical, axillary or inguinal adenopathy MSK: gait normal, muscle tone and strength WNL, no joint swelling, effusions, discoloration, crepitus  SKIN: clear, good turgor, color WNL, no rashes, lesions, or ulcerations Neuro: normal mental status, normal strength, sensation, and motion Psych: alert; oriented to person, place and time, normally interactive and not anxious or depressed in appearance.   All labs reviewed with patient. Results for orders placed or performed in visit on 10/14/22  IBC + Ferritin   Collection Time: 10/14/22  3:24 PM  Result Value Ref Range   Iron 155 (H) 42 -  145 ug/dL   Transferrin 710.9 787.9 - 360.0 mg/dL   Saturation Ratios 61.6 20.0 - 50.0 %   Ferritin 5.9 (L) 10.0 - 291.0 ng/mL   TIBC 404.6 250.0 - 450.0 mcg/dL  Hemoglobin J8r   Collection Time: 10/14/22  3:24 PM  Result Value Ref Range   Hgb A1c MFr Bld 5.0 4.6 - 6.5 %  Basic metabolic panel   Collection Time: 10/14/22  3:24 PM  Result Value Ref Range   Sodium 137 135 - 145 mEq/L   Potassium 4.1 3.5 - 5.1 mEq/L   Chloride 102 96 - 112 mEq/L   CO2 27 19 - 32 mEq/L   Glucose, Bld 89 70 - 99 mg/dL   BUN 12 6 - 23 mg/dL   Creatinine, Ser 9.28 0.40 - 1.20 mg/dL   GFR 897.39 >39.99 mL/min   Calcium 9.1 8.4 - 10.5 mg/dL  CBC with Differential/Platelet   Collection Time: 10/14/22  3:24 PM  Result Value Ref Range   WBC 4.8 4.0 - 10.5 K/uL   RBC 4.08 3.87 - 5.11 Mil/uL   Hemoglobin 9.3 (L) 12.0 - 15.0 g/dL   HCT 68.9 (L) 63.9 - 53.9 %   MCV 75.9 (L) 78.0 - 100.0 fl   MCHC 30.1 30.0 - 36.0 g/dL   RDW 77.4 (H) 88.4 - 84.4 %   Platelets 214.0 150.0 - 400.0 K/uL   Neutrophils Relative % 69.7 43.0 - 77.0 %   Lymphocytes Relative 18.9 12.0 - 46.0 %   Monocytes Relative 6.0 3.0 - 12.0 %   Eosinophils Relative 4.3 0.0 - 5.0 %   Basophils Relative 1.1 0.0 - 3.0 %   Neutro Abs 3.4 1.4 - 7.7 K/uL   Lymphs Abs 0.9 0.7 - 4.0 K/uL   Monocytes Absolute 0.3 0.1 - 1.0 K/uL   Eosinophils Absolute 0.2 0.0 - 0.7 K/uL   Basophils Absolute 0.1 0.0 - 0.1 K/uL  VITAMIN D  25 Hydroxy (Vit-D Deficiency, Fractures)   Collection Time: 10/14/22  3:24 PM  Result Value Ref Range   VITD 26.83 (L) 30.00 - 100.00 ng/mL  LDL cholesterol, direct   Collection Time: 10/14/22  3:24 PM  Result Value Ref Range   Direct LDL 94.0 mg/dL  Hepatic function panel   Collection  Time: 10/14/22  3:42 PM  Result Value Ref Range   Total Bilirubin 0.3 0.2 - 1.2 mg/dL   Bilirubin, Direct 0.1 0.0 - 0.3 mg/dL   Alkaline Phosphatase 68 39 - 117 U/L   AST 13 0 - 37 U/L   ALT 8 0 - 35 U/L   Total Protein 6.7 6.0 - 8.3 g/dL    Albumin 4.0 3.5 - 5.2 g/dL   No results found.  Assessment and Plan:     ICD-10-CM   1. Healthcare maintenance  Z00.00     2. Abnormal thyroid  blood test  R79.89 CANCELED: TSH    CANCELED: T4, free    CANCELED: T3, free    3. Encounter for long-term (current) use of medications  Z79.899 Basic metabolic panel with GFR    Hepatic function panel    Hemoglobin A1c    4. Screening for diabetes mellitus  Z13.1 Hemoglobin A1c    5. Other iron deficiency anemia  D50.8 CBC with Differential/Platelet    IBC + Ferritin    6. Vitamin D  deficiency  E55.9 VITAMIN D  25 Hydroxy (Vit-D Deficiency, Fractures)    7. Lynch syndrome  Z15.060 Ambulatory referral to Gastroenterology   Z15.09    Z15.07    Z15.068     8. Colon cancer screening  Z12.11 Ambulatory referral to Gastroenterology    9. Screening for lipid disorders  Z13.220 Lipid Panel     She is basically doing really well.  No concerns. Assessment & Plan Adult Wellness Visit Routine wellness visit. Ideal body weight for height. - Encourage regular physical activity, such as walking for 30-40 minutes daily. - Discussed importance of maintaining a healthy weight and staying active.  Lynch syndrome - increased risk of colorectal cancer Lynch syndrome increases her risk of colorectal cancer. Overdue for colonoscopy. - Send referral for colonoscopy to Dr. Marysue.  Iron deficiency anemia Experiences fatigue. Intolerant to adult multivitamins and pure iron supplements. Irregular menstrual cycles may contribute to anemia. - Order lab work to check iron levels. - Consider alternating between 18 mg Flintstone vitamins and full-strength iron supplements every other day if iron levels are low.  Asthma, moderate persistent, well-controlled Asthma well-controlled. No inhaler use in almost a year. Occasional chest discomfort possibly due to environmental factors. - Renew albuterol  prescription to keep it up to date.  Panic disorder with  panic attacks, in partial remission Panic disorder in partial remission. Improvement in anxiety and panic attacks. Considering changing Lexapro  dose timing to manage mood swings. Hydroxyzine  effective for anxiety and sleep. - Switch Lexapro  dosing to morning to potentially improve mood stability. - Prescribe Lexapro  10 mg tablets instead of 20 mg to avoid splitting tablets.  Depressive disorder, unspecified Depressive disorder well-managed. Considering adjusting Lexapro  dose timing for mood stability during perimenopause. - Switch Lexapro  dosing to morning to potentially improve mood stability.  Health Maintenance Exam: The patient's preventative maintenance and recommended screening tests for an annual wellness exam were reviewed in full today. Brought up to date unless services declined.  Counselled on the importance of diet, exercise, and its role in overall health and mortality. The patient's FH and SH was reviewed, including their home life, tobacco status, and drug and alcohol status.  Follow-up in 1 year for physical exam or additional follow-up below.  Disposition: No follow-ups on file.  Future Appointments  Date Time Provider Department Center  11/17/2023  1:55 PM Khamryn Calderone, Alicia B, PA-C [facility] None    Meds ordered this encounter  Medications  albuterol  (VENTOLIN  HFA) 108 (90 Base) MCG/ACT inhaler    Sig: USE 2 PUFFS EVERY 4 HOURS AS NEEDED FOR WHEEZING    Dispense:  18 g    Refill:  2   escitalopram  (LEXAPRO ) 10 MG tablet    Sig: Take 1 tablet (10 mg total) by mouth daily.    Dispense:  90 tablet    Refill:  3   Medications Discontinued During This Encounter  Medication Reason   loratadine  (CLARITIN ) 10 MG tablet Patient Preference   albuterol  (VENTOLIN  HFA) 108 (90 Base) MCG/ACT inhaler Reorder   escitalopram  (LEXAPRO ) 20 MG tablet    Orders Placed This Encounter  Procedures   Basic metabolic panel with GFR   CBC with Differential/Platelet   Hepatic function  panel   Hemoglobin A1c   VITAMIN D  25 Hydroxy (Vit-D Deficiency, Fractures)   IBC + Ferritin   Lipid Panel   Ambulatory referral to Gastroenterology    Signed,  Jacques T. Walker Paddack, MD   Allergies as of 10/26/2023       Reactions   Amoxicillin     Itching (once), no rash Rapid heart rate   Chicken Meat (diagnostic) Other (See Comments)   Throat feels tight and GERD   Dexilant  [dexlansoprazole ] Other (See Comments)   Throat feels tight        Medication List        Accurate as of October 26, 2023 11:59 PM. If you have any questions, ask your nurse or doctor.          STOP taking these medications    loratadine  10 MG tablet Commonly known as: Claritin  Stopped by: Jacques Audia Amick       TAKE these medications    albuterol  108 (90 Base) MCG/ACT inhaler Commonly known as: Ventolin  HFA USE 2 PUFFS EVERY 4 HOURS AS NEEDED FOR WHEEZING   escitalopram  10 MG tablet Commonly known as: LEXAPRO  Take 1 tablet (10 mg total) by mouth daily. What changed: medication strength Changed by: Jacques Gittel Mccamish   hydrOXYzine  10 MG tablet Commonly known as: ATARAX  TAKE 1 TABLET (10 MG TOTAL) BY MOUTH EVERY 6 (SIX) HOURS AS NEEDED. ANXIETY   mupirocin  ointment 2 % Commonly known as: BACTROBAN  SMARTSIG:1 Application Topical 2-3 Times Daily   nystatin  100000 UNIT/ML suspension Commonly known as: MYCOSTATIN  Take 5 mLs (500,000 Units total) by mouth 4 (four) times daily.   propylthiouracil 50 MG tablet Commonly known as: PTU Take by mouth. Once a day.        "

## 2023-10-26 ENCOUNTER — Ambulatory Visit (INDEPENDENT_AMBULATORY_CARE_PROVIDER_SITE_OTHER): Admitting: Family Medicine

## 2023-10-26 ENCOUNTER — Encounter: Payer: Self-pay | Admitting: Family Medicine

## 2023-10-26 VITALS — BP 100/62 | HR 77 | Temp 99.3°F | Ht 64.0 in | Wt 134.1 lb

## 2023-10-26 DIAGNOSIS — Z79899 Other long term (current) drug therapy: Secondary | ICD-10-CM

## 2023-10-26 DIAGNOSIS — Z1211 Encounter for screening for malignant neoplasm of colon: Secondary | ICD-10-CM

## 2023-10-26 DIAGNOSIS — Z13220 Encounter for screening for lipoid disorders: Secondary | ICD-10-CM

## 2023-10-26 DIAGNOSIS — D508 Other iron deficiency anemias: Secondary | ICD-10-CM | POA: Diagnosis not present

## 2023-10-26 DIAGNOSIS — E559 Vitamin D deficiency, unspecified: Secondary | ICD-10-CM | POA: Diagnosis not present

## 2023-10-26 DIAGNOSIS — R7989 Other specified abnormal findings of blood chemistry: Secondary | ICD-10-CM | POA: Diagnosis not present

## 2023-10-26 DIAGNOSIS — Z0000 Encounter for general adult medical examination without abnormal findings: Secondary | ICD-10-CM

## 2023-10-26 DIAGNOSIS — Z1509 Genetic susceptibility to other malignant neoplasm: Secondary | ICD-10-CM

## 2023-10-26 DIAGNOSIS — Z131 Encounter for screening for diabetes mellitus: Secondary | ICD-10-CM

## 2023-10-26 DIAGNOSIS — Z15068 Genetic susceptibility to other malignant neoplasm of digestive system: Secondary | ICD-10-CM

## 2023-10-26 DIAGNOSIS — Z1507 Genetic susceptibility to malignant neoplasm of urinary tract: Secondary | ICD-10-CM

## 2023-10-26 DIAGNOSIS — Z15060 Genetic susceptibility to colorectal cancer: Secondary | ICD-10-CM

## 2023-10-26 MED ORDER — ALBUTEROL SULFATE HFA 108 (90 BASE) MCG/ACT IN AERS
INHALATION_SPRAY | RESPIRATORY_TRACT | 2 refills | Status: AC
Start: 2023-10-26 — End: ?

## 2023-10-26 MED ORDER — ESCITALOPRAM OXALATE 10 MG PO TABS: 10.0000 mg | ORAL_TABLET | Freq: Every day | ORAL | 3 refills | Status: AC

## 2023-10-27 ENCOUNTER — Encounter: Payer: Self-pay | Admitting: Family Medicine

## 2023-10-27 ENCOUNTER — Ambulatory Visit: Payer: Self-pay

## 2023-10-27 ENCOUNTER — Ambulatory Visit: Payer: Self-pay | Admitting: Family Medicine

## 2023-10-27 LAB — CBC WITH DIFFERENTIAL/PLATELET
Basophils Absolute: 0 K/uL (ref 0.0–0.1)
Basophils Relative: 0.5 % (ref 0.0–3.0)
Eosinophils Absolute: 0.2 K/uL (ref 0.0–0.7)
Eosinophils Relative: 2.4 % (ref 0.0–5.0)
HCT: 32 % — ABNORMAL LOW (ref 36.0–46.0)
Hemoglobin: 9.7 g/dL — ABNORMAL LOW (ref 12.0–15.0)
Lymphocytes Relative: 12.8 % (ref 12.0–46.0)
Lymphs Abs: 1.2 K/uL (ref 0.7–4.0)
MCHC: 30.2 g/dL (ref 30.0–36.0)
MCV: 74.8 fl — ABNORMAL LOW (ref 78.0–100.0)
Monocytes Absolute: 0 K/uL — ABNORMAL LOW (ref 0.1–1.0)
Monocytes Relative: 0.4 % — ABNORMAL LOW (ref 3.0–12.0)
Neutro Abs: 7.9 K/uL — ABNORMAL HIGH (ref 1.4–7.7)
Neutrophils Relative %: 83.9 % — ABNORMAL HIGH (ref 43.0–77.0)
Platelets: 190 K/uL (ref 150.0–400.0)
RBC: 4.28 Mil/uL (ref 3.87–5.11)
RDW: 19.5 % — ABNORMAL HIGH (ref 11.5–15.5)
WBC: 9.4 K/uL (ref 4.0–10.5)

## 2023-10-27 LAB — LIPID PANEL
Cholesterol: 171 mg/dL (ref 0–200)
HDL: 65.6 mg/dL (ref >39.00)
LDL Cholesterol: 89 mg/dL (ref 0–99)
NonHDL: 105.04
Total CHOL/HDL Ratio: 3
Triglycerides: 78 mg/dL (ref 0.0–149.0)
VLDL: 15.6 mg/dL (ref 0.0–40.0)

## 2023-10-27 LAB — BASIC METABOLIC PANEL WITH GFR
BUN: 13 mg/dL (ref 6–23)
CO2: 24 meq/L (ref 19–32)
Calcium: 9 mg/dL (ref 8.4–10.5)
Chloride: 105 meq/L (ref 96–112)
Creatinine, Ser: 0.77 mg/dL (ref 0.40–1.20)
GFR: 92.41 mL/min (ref >60.00)
Glucose, Bld: 91 mg/dL (ref 70–99)
Potassium: 4 meq/L (ref 3.5–5.1)
Sodium: 137 meq/L (ref 135–145)

## 2023-10-27 LAB — IBC + FERRITIN
Ferritin: 4.5 ng/mL — ABNORMAL LOW (ref 10.0–291.0)
Iron: 19 ug/dL — ABNORMAL LOW (ref 42–145)
Saturation Ratios: 4.2 % — ABNORMAL LOW (ref 20.0–50.0)
TIBC: 449.4 ug/dL (ref 250.0–450.0)
Transferrin: 321 mg/dL (ref 212.0–360.0)

## 2023-10-27 LAB — HEMOGLOBIN A1C: Hgb A1c MFr Bld: 5.3 % (ref 4.6–6.5)

## 2023-10-27 LAB — HEPATIC FUNCTION PANEL
ALT: 10 U/L (ref 0–35)
AST: 15 U/L (ref 0–37)
Albumin: 4.3 g/dL (ref 3.5–5.2)
Alkaline Phosphatase: 75 U/L (ref 39–117)
Bilirubin, Direct: 0.1 mg/dL (ref 0.0–0.3)
Total Bilirubin: 0.2 mg/dL (ref 0.2–1.2)
Total Protein: 7.2 g/dL (ref 6.0–8.3)

## 2023-10-27 LAB — VITAMIN D 25 HYDROXY (VIT D DEFICIENCY, FRACTURES): VITD: 29.88 ng/mL — ABNORMAL LOW (ref 30.00–100.00)

## 2023-10-27 NOTE — Telephone Encounter (Signed)
 Please see phone note as well as separate MyChart message.   Please advise.

## 2023-10-27 NOTE — Telephone Encounter (Signed)
I spoke to her on the phone

## 2023-10-27 NOTE — Telephone Encounter (Signed)
 I talked to [facility] on the phone.  Please see my result note.

## 2023-10-27 NOTE — Addendum Note (Signed)
 Addended by: WATT MIRZA on: 10/27/2023 05:25 PM   Modules accepted: Orders

## 2023-10-27 NOTE — Telephone Encounter (Signed)
 FYI Only or Action Required?: Action required by provider: lab or test result follow-up needed.  Patient was last seen in primary care on 10/26/2023 by Watt Mirza, MD.  Called Nurse Triage reporting Results.  Symptoms began no triage .  Interventions attempted: Other: no triage.  Symptoms are: no triage .  Triage Disposition: Call PCP Within 24 Hours  Patient/caregiver understands and will follow disposition?: No, wishes to speak with PCP   Copied from CRM #[id number]. Topic: Clinical - Lab/Test Results >> Oct 27, 2023  3:27 PM Gloria Rush wrote: Reason for CRM: Pt wanted to speak to a nurse about lab results that are concerning her. Per TL, required to send CRM and WT to NT line. Reason for Disposition  Caller requesting lab results  (Exception: Routine or non-urgent lab result.)  Answer Assessment - Initial Assessment Questions 1. REASON FOR CALL or QUESTION: What is your reason for calling today? or How can I best Review lab results.   Chart reviewed no notes by provider. Patient states she is able to view and has specific questions she would like answered today. She states her abnormal values are causing her anxiety. Asking for Dr. Loetta nurse to call her back directly.  Protocols used: PCP Call - No Triage-A-AH

## 2023-11-04 ENCOUNTER — Other Ambulatory Visit: Payer: Self-pay | Admitting: Family Medicine

## 2023-11-17 ENCOUNTER — Ambulatory Visit: Admitting: Obstetrics and Gynecology

## 2023-11-18 NOTE — Progress Notes (Signed)
 Chief Complaint  Patient presents with   Gynecologic Exam    No concerns     HPI:      Gloria Rush is a 48 y.o. G0P0000 who LMP was Patient's last menstrual period was 11/03/2023 (exact date)., presents today for her annual examination.  Her menses are regular every 28-30 days, lasting 6-7 days, lighter flow now, still with clots, used to be mod to heavy, no BTB, mild dysmen, worse with ovulation than period.  Hx of IDA  with low ferritin levels, still present on 7/25 labs because she wasn't taking Fe supp, followed by PCP. Now taking Fe supp and has f/u labs 12/25. Hx of 4.5 cm leio on 12/21 and 3/25 GYN u/s.  Tried lysteda  for menorrhagia in past with good sx relief, but pt has asthma and caused chest tightness for her. Has tried a couple different OCPs for cycle control (and ovar cancer prevention) but increases pt's anxiety. Also with hx of ovar cyst in past, resolved on 10/20 GYN u/s. Had severe RLQ pain 6/24 for a day or so that resolved and pelvic pain with LBP 2/25 with neg GYN u/s 3/25.    Hx of thyroid  disorder, controlled now. Feels menses are improved with this. Anxiety improved with thyroid  control.   Sex activity: single partner, contraception - none. Hx of infertility. No pain/bleeding/dryness. Declines BC.  Last Pap: 12/11/19 Results were: no abnormalities /neg HPV DNA   Last mammogram: 08/23/23  Results were: normal--routine follow-up in 12 months.  There is a FH of breast cancer in her MGM, 2 mat grt aunts and mat aunt. There is no FH of ovarian cancer. The patient does do self-breast exams. The patient is BRCA neg but Lynch positive (PMS2). IBIS=20.1%  2014. Pt has not had screening breast MRI. Pt found out her breast cancer affected mat aunt is Lynch positive too. Pt's mom still declines testing.  No FH Lynch syndrome cancers.   Tobacco use: The patient denies current or previous tobacco use. Alcohol use: none  No drug use.  Exercise: mod active  She does get  adequate calcium and Vitamin D  in her diet.  Labs with PCP.  She is Lynch pos (PMS2).   Per 2025 NCCN guidelines, ENDOMETRIAL AND COLORECTAL CANCERS ONLY NOW:  Endometrial cancer:  Q1-2 yrs EMB (neg 7/24); pt wants to RTO with u/s for EMB Yearly u/s (done 3/25)--with EM=15 mm, neg EMB 7/24   Hyst recommended. Pt aware and considering due to heavy periods.    Colorectal: Colonoscopy Q1-3 yrs, consider starting ASA. Pt had done with Dr. Albertus 3/18 , thought she should wait to age 59 for insurance to cover even though doesn't apply to her. Would like referral now for insurance purposes.   NO LONGER LISTED IN NCCN GUIDELINES FOR MED MGMT: Ovarian cancer: 2024:Recommend BSO age 10 or after childbearing; pt not interested currently.   Suggest yearly GYN u/s (done 3/25) and ca-125 (neg 9/24)  OCPs if pt doesn't want BSO--tried in past but can't tolerate side effects of increased anxiety. Hepatobiliary cancers: no med mgmt guidelines Pancreatic cancers:no FH; no screening options available Urothelial: check UA yearly   Past Medical History:  Diagnosis Date   Anxiety    Asthma    Family history of breast cancer    Fibroid    Genetic testing of female    MyRisk Positive   Increased risk of breast cancer 2014, 2018   riskscore=22.2% 6/18   Lynch syndrome  PMS2 pos 11/14, ATM VUS   Panic disorder     Past Surgical History:  Procedure Laterality Date   WISDOM TOOTH EXTRACTION      Family History  Problem Relation Age of Onset   Anxiety disorder Mother    Anxiety disorder Brother    Breast cancer Maternal Grandmother 40   Diabetes Maternal Grandfather    Breast cancer Maternal Aunt 75       Lynch positive   Breast cancer Other 22   Colon cancer Neg Hx    Stomach cancer Neg Hx     Social History   Socioeconomic History   Marital status: Married    Spouse name: Not on file   Number of children: Not on file   Years of education: Not on file   Highest education level:  Some college, no degree  Occupational History   Not on file  Tobacco Use   Smoking status: Never   Smokeless tobacco: Never  Vaping Use   Vaping status: Never Used  Substance and Sexual Activity   Alcohol use: No    Alcohol/week: 0.0 standard drinks of alcohol   Drug use: No   Sexual activity: Yes    Partners: Male    Birth control/protection: None  Other Topics Concern   Not on file  Social History Narrative   Not on file   Social Drivers of Health   Financial Resource Strain: Low Risk  (07/14/2023)   Received from [facility]   Overall Financial Resource Strain (CARDIA)    Difficulty of Paying Living Expenses: Not very hard  Food Insecurity: No Food Insecurity (07/14/2023)   Received from [facility]   Hunger Vital Sign    Within the past 12 months, you worried that your food would run out before you got the money to buy more.: Never true    Within the past 12 months, the food you bought just didn't last and you didn't have money to get more.: Never true  Transportation Needs: No Transportation Needs (07/14/2023)   Received from [facility] - Transportation    In the past 12 months, has lack of transportation kept you from medical appointments or from getting medications?: No    Lack of Transportation (Non-Medical): No  Physical Activity: Insufficiently Active (02/28/2023)   Exercise Vital Sign    Days of Exercise per Week: 2 days    Minutes of Exercise per Session: 20 min  Stress: No Stress Concern Present (02/28/2023)   [employer] of Occupational Health - Occupational Stress Questionnaire    Feeling of Stress : Not at all  Social Connections: Unknown (02/28/2023)   Social Connection and Isolation Panel    Frequency of Communication with Friends and Family: Once a week    Frequency of Social Gatherings with Friends and Family: Patient declined    Attends Religious Services: More than 4 times per year     Active Member of [employer] or Organizations: Yes    Attends Engineer, Structural: More than 4 times per year    Marital Status: Married  Catering Manager Violence: Not on file     Current Outpatient Medications:    albuterol  (VENTOLIN  HFA) 108 (90 Base) MCG/ACT inhaler, USE 2 PUFFS EVERY 4 HOURS AS NEEDED FOR WHEEZING, Disp: 18 g, Rfl: 2   escitalopram  (LEXAPRO ) 10 MG tablet, Take 1 tablet (10 mg total) by mouth daily., Disp: 90 tablet, Rfl: 3   hydrOXYzine  (  ATARAX ) 10 MG tablet, TAKE 1 TABLET (10 MG TOTAL) BY MOUTH EVERY 6 (SIX) HOURS AS NEEDED. ANXIETY, Disp: 369 tablet, Rfl: 1   mupirocin  ointment (BACTROBAN ) 2 %, SMARTSIG:1 Application Topical 2-3 Times Daily, Disp: 22 g, Rfl: 3   nystatin  (MYCOSTATIN ) 100000 UNIT/ML suspension, Take 5 mLs (500,000 Units total) by mouth 4 (four) times daily., Disp: 473 mL, Rfl: 0   propylthiouracil (PTU) 50 MG tablet, Take by mouth. Once a day., Disp: , Rfl:   ROS:  Review of Systems  Constitutional:  Negative for fatigue, fever and unexpected weight change.  Respiratory:  Negative for cough, shortness of breath and wheezing.   Cardiovascular:  Negative for chest pain, palpitations and leg swelling.  Gastrointestinal:  Negative for blood in stool, constipation, diarrhea, nausea and vomiting.  Endocrine: Negative for cold intolerance, heat intolerance and polyuria.  Genitourinary:  Positive for menstrual problem. Negative for dyspareunia, dysuria, flank pain, frequency, genital sores, hematuria, pelvic pain, urgency, vaginal bleeding, vaginal discharge and vaginal pain.  Musculoskeletal:  Negative for back pain, joint swelling and myalgias.  Skin:  Negative for rash.  Neurological:  Negative for dizziness, syncope, light-headedness, numbness and headaches.  Hematological:  Negative for adenopathy.  Psychiatric/Behavioral:  Negative for agitation, confusion, sleep disturbance and suicidal ideas. The patient is not nervous/anxious.       Objective: BP 106/67   Pulse 71   Ht 5' 4 (1.626 m)   Wt 133 lb (60.3 kg)   LMP 11/03/2023 (Exact Date)   BMI 22.83 kg/m    Physical Exam Constitutional:      Appearance: She is well-developed.  Genitourinary:     Vulva normal.     Right Labia: No rash, tenderness or lesions.    Left Labia: No tenderness, lesions or rash.    No vaginal discharge, erythema or tenderness.      Right Adnexa: not tender and no mass present.    Left Adnexa: not tender and no mass present.    No cervical motion tenderness, friability or polyp.     Uterus is enlarged.     Uterus is not tender.  Breasts:    Right: No mass, nipple discharge, skin change or tenderness.     Left: No mass, nipple discharge, skin change or tenderness.  Neck:     Thyroid : No thyromegaly.  Cardiovascular:     Rate and Rhythm: Normal rate and regular rhythm.     Heart sounds: Normal heart sounds. No murmur heard. Pulmonary:     Effort: Pulmonary effort is normal.     Breath sounds: Normal breath sounds.  Abdominal:     Palpations: Abdomen is soft.     Tenderness: There is no abdominal tenderness. There is no guarding or rebound.  Musculoskeletal:        General: Normal range of motion.     Cervical back: Normal range of motion.  Lymphadenopathy:     Cervical: No cervical adenopathy.  Neurological:     General: No focal deficit present.     Mental Status: She is alert and oriented to person, place, and time.     Cranial Nerves: No cranial nerve deficit.  Skin:    General: Skin is warm and dry.  Psychiatric:        Mood and Affect: Mood normal.        Behavior: Behavior normal.        Thought Content: Thought content normal.        Judgment: Judgment normal.  Vitals reviewed.    Assessment/Plan: Encounter for annual routine gynecological examination  Cervical cancer screening - Plan: Cytology - PAP  Screening for HPV (human papillomavirus) - Plan: Cytology - PAP  Encounter for screening  mammogram for malignant neoplasm of breast - Plan: MM 3D SCREENING MAMMOGRAM BILATERAL BREAST; pt current on mammo  Family history of breast cancer--Pt is BRCA neg.  Increased risk of breast cancer--IBIS=20.1%. Discussed monthly SBE, yearly CBE and mammos, as well as scr breast MRI. Pt to consider MRI and f/u if wants 5 months after mammo. Cont Vit D supp.   Lynch syndrome - Plan: Ambulatory referral to Gastroenterology, US  PELVIS TRANSVAGINAL NON-OB (TV ONLY); pt to RTO for GYN u/s and EMB, hyst discussed. Refer to GI for colonoscopy. Other cancers originally associated with PMS2 no longer discussed in NCCN 2025 guidelines.   Screening for colon cancer - Plan: Ambulatory referral to Gastroenterology  Leiomyoma - Plan: US  PELVIS TRANSVAGINAL NON-OB (TV ONLY); check GYN u/s, discussed tx with hyst vs UFE. Pt to consider and f/u prn.   Iron deficiency anemia due to chronic blood loss--taking Fe supp, has lab f/u 12/25. Recommended colonoscopy to verify not from GI. Also discussed hematology ref for iron infusions if H/H don't improve as well as hyst vs UFE for menses to stop the blood loss. Pt to consider.           GYN counsel breast self exam, mammography screening, adequate intake of calcium and vitamin D , diet and exercise     F/U  Return in about 1 week (around 11/28/2023) for GYN u/s for LYnch syndrome with EMB with me afterwards--can use same day./ 1 yr annual  Sirena Riddle B. Raheem Kolbe, PA-C 11/21/2023 4:04 PM

## 2023-11-20 ENCOUNTER — Encounter: Payer: Self-pay | Admitting: Family Medicine

## 2023-11-21 ENCOUNTER — Ambulatory Visit (INDEPENDENT_AMBULATORY_CARE_PROVIDER_SITE_OTHER): Admitting: Obstetrics and Gynecology

## 2023-11-21 ENCOUNTER — Other Ambulatory Visit (HOSPITAL_COMMUNITY)
Admission: RE | Admit: 2023-11-21 | Discharge: 2023-11-21 | Disposition: A | Discharge status: Home / Self Care | Source: Ambulatory Visit | Attending: Obstetrics and Gynecology | Admitting: Obstetrics and Gynecology

## 2023-11-21 ENCOUNTER — Encounter: Payer: Self-pay | Admitting: Internal Medicine

## 2023-11-21 ENCOUNTER — Encounter: Payer: Self-pay | Admitting: Obstetrics and Gynecology

## 2023-11-21 VITALS — BP 106/67 | HR 71 | Ht 64.0 in | Wt 133.0 lb

## 2023-11-21 DIAGNOSIS — Z124 Encounter for screening for malignant neoplasm of cervix: Secondary | ICD-10-CM | POA: Insufficient documentation

## 2023-11-21 DIAGNOSIS — D500 Iron deficiency anemia secondary to blood loss (chronic): Secondary | ICD-10-CM

## 2023-11-21 DIAGNOSIS — Z01419 Encounter for gynecological examination (general) (routine) without abnormal findings: Secondary | ICD-10-CM

## 2023-11-21 DIAGNOSIS — Z1507 Genetic susceptibility to malignant neoplasm of urinary tract: Secondary | ICD-10-CM

## 2023-11-21 DIAGNOSIS — Z9189 Other specified personal risk factors, not elsewhere classified: Secondary | ICD-10-CM

## 2023-11-21 DIAGNOSIS — Z1151 Encounter for screening for human papillomavirus (HPV): Secondary | ICD-10-CM

## 2023-11-21 DIAGNOSIS — Z1231 Encounter for screening mammogram for malignant neoplasm of breast: Secondary | ICD-10-CM

## 2023-11-21 DIAGNOSIS — Z803 Family history of malignant neoplasm of breast: Secondary | ICD-10-CM

## 2023-11-21 DIAGNOSIS — D259 Leiomyoma of uterus, unspecified: Secondary | ICD-10-CM | POA: Diagnosis not present

## 2023-11-21 DIAGNOSIS — Z01411 Encounter for gynecological examination (general) (routine) with abnormal findings: Secondary | ICD-10-CM

## 2023-11-21 DIAGNOSIS — D219 Benign neoplasm of connective and other soft tissue, unspecified: Secondary | ICD-10-CM

## 2023-11-21 DIAGNOSIS — Z1211 Encounter for screening for malignant neoplasm of colon: Secondary | ICD-10-CM

## 2023-11-21 NOTE — Patient Instructions (Signed)
 I value your feedback and you entrusting [place] with your care. If you get a [place] patient survey, I would appreciate you taking the time to let us know about your experience today. Thank you! ? ? ?

## 2023-11-23 LAB — CYTOLOGY - PAP
Comment: NEGATIVE
Diagnosis: NEGATIVE
High risk HPV: NEGATIVE

## 2023-12-25 ENCOUNTER — Telehealth

## 2023-12-25 ENCOUNTER — Telehealth: Admitting: Family

## 2023-12-25 DIAGNOSIS — M545 Low back pain, unspecified: Secondary | ICD-10-CM

## 2023-12-25 DIAGNOSIS — N898 Other specified noninflammatory disorders of vagina: Secondary | ICD-10-CM | POA: Diagnosis not present

## 2023-12-25 NOTE — Progress Notes (Signed)
°  Because of your symptoms, I feel your condition warrants further evaluation and I recommend that you be seen in a face-to-face visit for further testing.   NOTE: There will be NO CHARGE for this E-Visit   If you are having a true medical emergency, please call 911.     For an urgent face to face visit, [facility] has multiple urgent care centers for your convenience.  Click the link below for the full list of locations and hours, walk-in wait times, appointment scheduling options and driving directions:  Urgent Care - [place], [place], [place], [place], [place], [place]  [place]     Your MyChart E-visit questionnaire answers were reviewed by a board certified advanced clinical practitioner to complete your personal care plan based on your specific symptoms.    Thank you for using e-Visits.

## 2023-12-26 ENCOUNTER — Other Ambulatory Visit (INDEPENDENT_AMBULATORY_CARE_PROVIDER_SITE_OTHER)

## 2023-12-26 DIAGNOSIS — D508 Other iron deficiency anemias: Secondary | ICD-10-CM | POA: Diagnosis not present

## 2023-12-26 LAB — IBC + FERRITIN
Ferritin: 10.3 ng/mL (ref 10.0–291.0)
Iron: 71 ug/dL (ref 42–145)
Saturation Ratios: 17.6 % — ABNORMAL LOW (ref 20.0–50.0)
TIBC: 403.2 ug/dL (ref 250.0–450.0)
Transferrin: 288 mg/dL (ref 212.0–360.0)

## 2023-12-26 LAB — CBC WITH DIFFERENTIAL/PLATELET
Basophils Absolute: 0.1 K/uL (ref 0.0–0.1)
Basophils Relative: 1.5 % (ref 0.0–3.0)
Eosinophils Absolute: 0.2 K/uL (ref 0.0–0.7)
Eosinophils Relative: 4.8 % (ref 0.0–5.0)
HCT: 36.6 % (ref 36.0–46.0)
Hemoglobin: 11.7 g/dL — ABNORMAL LOW (ref 12.0–15.0)
Lymphocytes Relative: 22.8 % (ref 12.0–46.0)
Lymphs Abs: 1 K/uL (ref 0.7–4.0)
MCHC: 31.9 g/dL (ref 30.0–36.0)
MCV: 81.4 fl (ref 78.0–100.0)
Monocytes Absolute: 0.3 K/uL (ref 0.1–1.0)
Monocytes Relative: 6.1 % (ref 3.0–12.0)
Neutro Abs: 2.7 K/uL (ref 1.4–7.7)
Neutrophils Relative %: 64.8 % (ref 43.0–77.0)
Platelets: 174 K/uL (ref 150.0–400.0)
RBC: 4.49 Mil/uL (ref 3.87–5.11)
RDW: 17.7 % — ABNORMAL HIGH (ref 11.5–15.5)
WBC: 4.2 K/uL (ref 4.0–10.5)

## 2023-12-26 NOTE — Addendum Note (Signed)
 Addended by: LORELLE ROCKY BRAVO on: 12/26/2023 08:19 AM   Modules accepted: Orders

## 2023-12-31 ENCOUNTER — Telehealth: Admitting: Family Medicine

## 2023-12-31 DIAGNOSIS — J069 Acute upper respiratory infection, unspecified: Secondary | ICD-10-CM | POA: Diagnosis not present

## 2023-12-31 MED ORDER — LIDOCAINE VISCOUS HCL 2 % MT SOLN: 15.0000 mL | OROMUCOSAL | 0 refills | Status: AC | PRN

## 2023-12-31 MED ORDER — BENZONATATE 100 MG PO CAPS: 100.0000 mg | ORAL_CAPSULE | Freq: Three times a day (TID) | ORAL | 0 refills | Status: AC | PRN

## 2023-12-31 MED ORDER — FLUTICASONE PROPIONATE 50 MCG/ACT NA SUSP: 2.0000 | Freq: Every day | NASAL | 6 refills | Status: AC

## 2023-12-31 NOTE — Progress Notes (Signed)

## 2024-01-20 ENCOUNTER — Ambulatory Visit

## 2024-01-20 VITALS — Ht 64.5 in | Wt 135.2 lb

## 2024-01-20 DIAGNOSIS — Z8481 Family history of carrier of genetic disease: Secondary | ICD-10-CM

## 2024-01-20 MED ORDER — NA SULFATE-K SULFATE-MG SULF 17.5-3.13-1.6 GM/177ML PO SOLN: 1.0000 | Freq: Once | ORAL | 0 refills | Status: AC

## 2024-01-20 NOTE — Progress Notes (Signed)
 No egg or soy allergy known to patient  No issues known to pt with past sedation with any surgeries or procedures Patient denies ever being told they had issues or difficulty with intubation  No FH of Malignant Hyperthermia Pt is not on diet pills Pt is not on  home 02  Pt is not on blood thinners  Pt has intermittent issues with constipation; takes no meds  No A fib or A flutter Have any cardiac testing pending--No Pt can ambulate  Pt denies use of chewing tobacco Discussed diabetic I weight loss medication holds Discussed NSAID holds Checked BMI Pt instructed to use Singlecare.com or GoodRx for a price reduction on prep  Patient's chart reviewed by Norleen Schillings CNRA prior to previsit and patient appropriate for the [facility].  Pre visit completed and red dot placed by patient's name on their procedure day (on provider's schedule).

## 2024-01-23 ENCOUNTER — Encounter: Payer: Self-pay | Admitting: Family Medicine

## 2024-01-24 ENCOUNTER — Encounter: Payer: Self-pay | Admitting: Obstetrics and Gynecology

## 2024-02-03 ENCOUNTER — Encounter: Admitting: Internal Medicine

## 2024-02-08 ENCOUNTER — Encounter: Payer: Self-pay | Admitting: Internal Medicine

## 2024-02-15 ENCOUNTER — Encounter: Payer: Self-pay | Admitting: Internal Medicine

## 2024-02-17 ENCOUNTER — Encounter: Payer: Self-pay | Admitting: Family Medicine

## 2024-02-24 ENCOUNTER — Encounter: Admitting: Internal Medicine
